# Patient Record
Sex: Male | Born: 1990
Health system: Southern US, Community
[De-identification: ages and names within clinical notes are randomized; demographics above are authoritative.]

## PROBLEM LIST (undated history)

## (undated) DIAGNOSIS — F909 Attention-deficit hyperactivity disorder, unspecified type: Secondary | ICD-10-CM

## (undated) DIAGNOSIS — F419 Anxiety disorder, unspecified: Secondary | ICD-10-CM

## (undated) DIAGNOSIS — F840 Autistic disorder: Secondary | ICD-10-CM

## (undated) HISTORY — DX: Autistic disorder: F84.0

## (undated) HISTORY — DX: Anxiety disorder, unspecified: F41.9

## (undated) HISTORY — DX: Attention-deficit hyperactivity disorder, unspecified type: F90.9

---
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
## 2012-01-02 ENCOUNTER — Institutional Professional Consult (permissible substitution): Payer: BC Managed Care – PPO | Admitting: Pediatrics

## 2012-01-13 ENCOUNTER — Institutional Professional Consult (permissible substitution) (INDEPENDENT_AMBULATORY_CARE_PROVIDER_SITE_OTHER): Payer: BC Managed Care – PPO | Admitting: Pediatrics

## 2012-01-13 DIAGNOSIS — R279 Unspecified lack of coordination: Secondary | ICD-10-CM

## 2012-01-13 DIAGNOSIS — F909 Attention-deficit hyperactivity disorder, unspecified type: Secondary | ICD-10-CM

## 2012-04-13 ENCOUNTER — Institutional Professional Consult (permissible substitution) (INDEPENDENT_AMBULATORY_CARE_PROVIDER_SITE_OTHER): Payer: BC Managed Care – PPO | Admitting: Pediatrics

## 2012-04-13 DIAGNOSIS — R279 Unspecified lack of coordination: Secondary | ICD-10-CM

## 2012-04-13 DIAGNOSIS — F909 Attention-deficit hyperactivity disorder, unspecified type: Secondary | ICD-10-CM

## 2012-07-16 ENCOUNTER — Institutional Professional Consult (permissible substitution) (INDEPENDENT_AMBULATORY_CARE_PROVIDER_SITE_OTHER): Payer: 59 | Admitting: Pediatrics

## 2012-07-16 DIAGNOSIS — R279 Unspecified lack of coordination: Secondary | ICD-10-CM

## 2012-07-16 DIAGNOSIS — F909 Attention-deficit hyperactivity disorder, unspecified type: Secondary | ICD-10-CM

## 2012-07-17 IMAGING — CT CT PARANASAL SINUSES LIMITED
1 series · 8 of 10 positions shown, 10 images · non-contrast
Comparison: 02/03/2007

CLINICAL DATA: Frontal sinus pain.  Pressure.  Headaches.

CT PARANASAL SINUSES WITHOUT CONTRAST
TECHNIQUE: Multidetector CT through the paranasal sinuses was
performed using the standard protocol without intravenous contrast.

[Series 3: cor soft · axial · 0.35mm/px · z∈[+0,+70]mm · 8 of 10 slices shown, 10 images]
[im 2/10  brain]
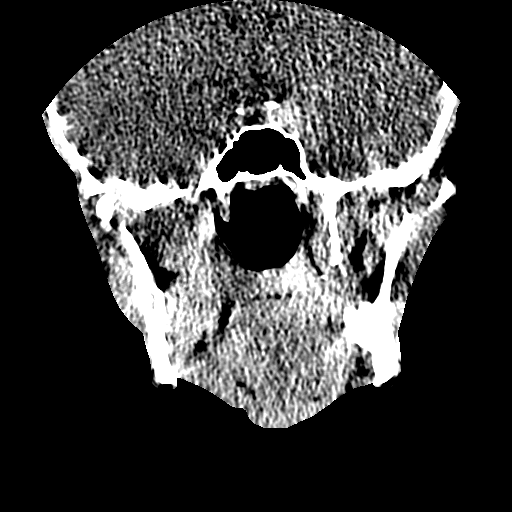
[im 2/10  bone]
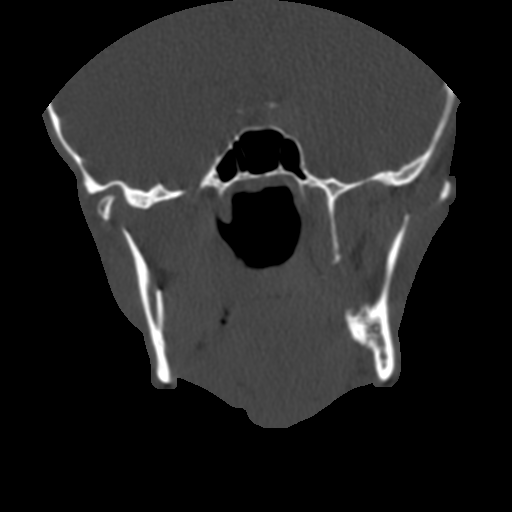
[im 3/10  bone]
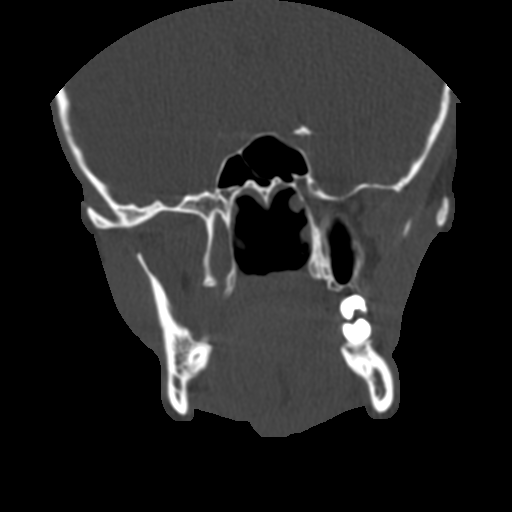
[im 4/10  bone]
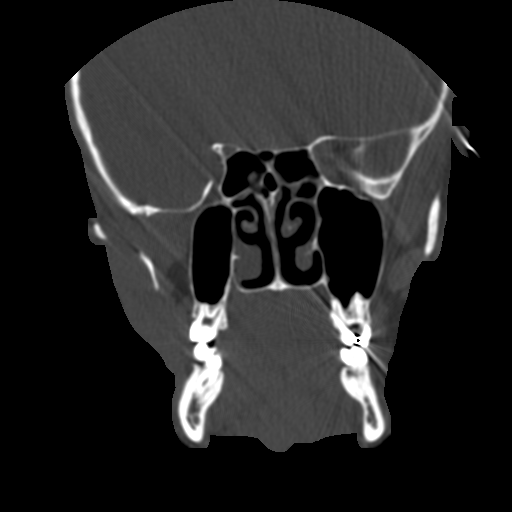
[im 5/10  bone]
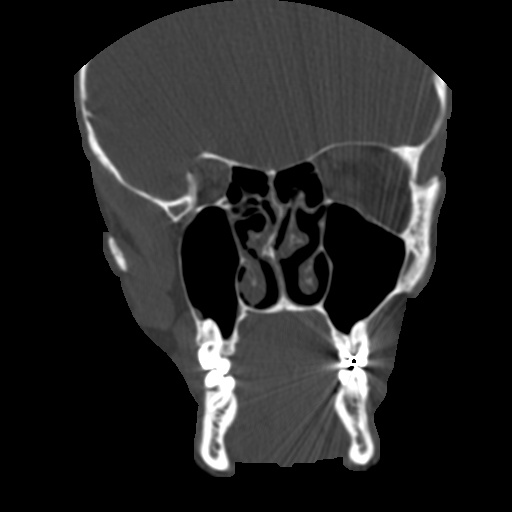
[im 6/10  brain]
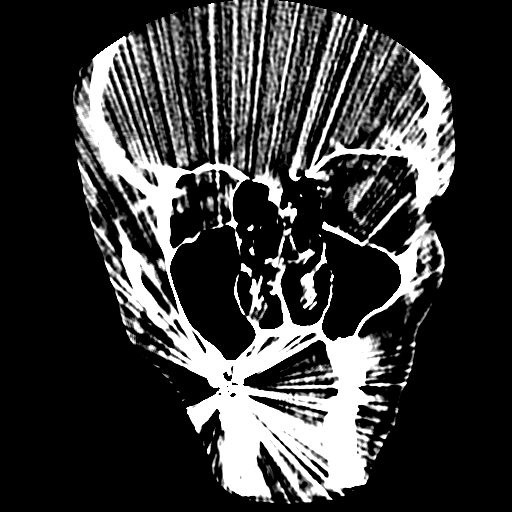
[im 6/10  bone]
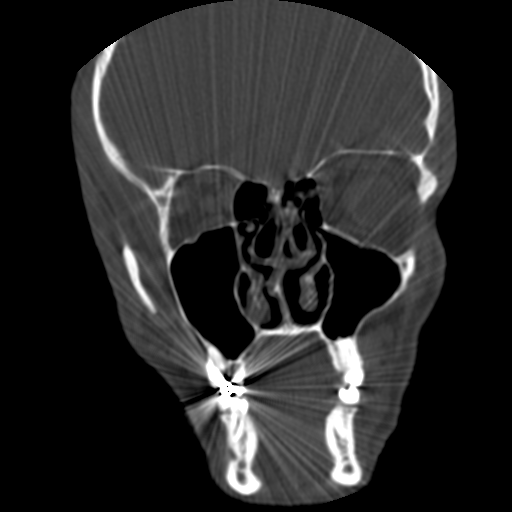
[im 7/10  bone]
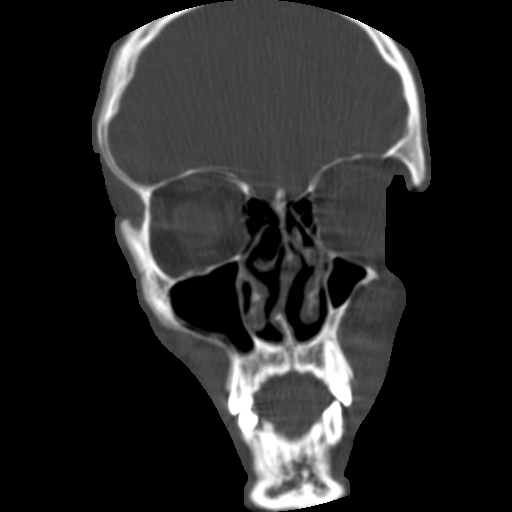
[im 8/10  bone]
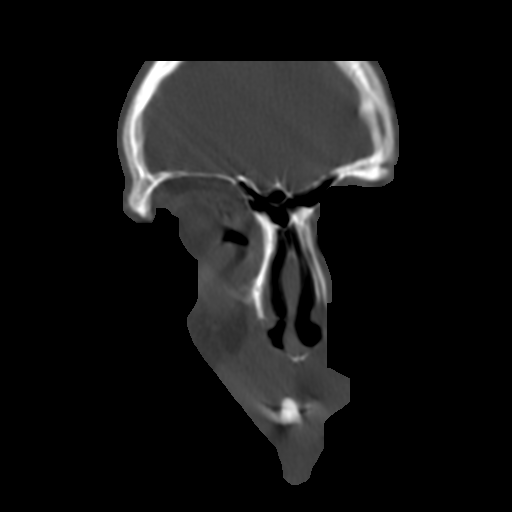
[im 9/10  bone]
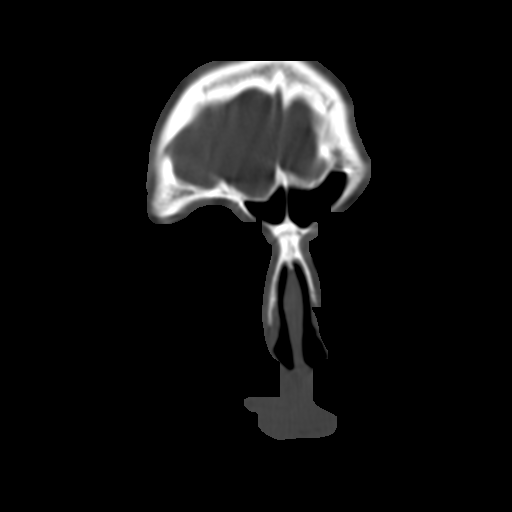

[8 of 10 positions shown; findings below may reference images not displayed]

FINDINGS: Frontal, ethmoid, sphenoid and maxillary sinuses are all
clear.  No evidence of adenoidal hypertrophy.
IMPRESSION: Negative examination.

## 2012-10-22 ENCOUNTER — Institutional Professional Consult (permissible substitution) (INDEPENDENT_AMBULATORY_CARE_PROVIDER_SITE_OTHER): Payer: 59 | Admitting: Pediatrics

## 2012-10-22 DIAGNOSIS — R279 Unspecified lack of coordination: Secondary | ICD-10-CM

## 2012-10-22 DIAGNOSIS — F909 Attention-deficit hyperactivity disorder, unspecified type: Secondary | ICD-10-CM

## 2013-01-21 ENCOUNTER — Institutional Professional Consult (permissible substitution) (INDEPENDENT_AMBULATORY_CARE_PROVIDER_SITE_OTHER): Payer: 59 | Admitting: Pediatrics

## 2013-01-21 DIAGNOSIS — R279 Unspecified lack of coordination: Secondary | ICD-10-CM

## 2013-01-21 DIAGNOSIS — F909 Attention-deficit hyperactivity disorder, unspecified type: Secondary | ICD-10-CM

## 2013-04-22 ENCOUNTER — Institutional Professional Consult (permissible substitution): Payer: 59 | Admitting: Pediatrics

## 2013-04-29 ENCOUNTER — Institutional Professional Consult (permissible substitution) (INDEPENDENT_AMBULATORY_CARE_PROVIDER_SITE_OTHER): Payer: BC Managed Care – PPO | Admitting: Pediatrics

## 2013-04-29 DIAGNOSIS — F909 Attention-deficit hyperactivity disorder, unspecified type: Secondary | ICD-10-CM

## 2013-04-29 DIAGNOSIS — R279 Unspecified lack of coordination: Secondary | ICD-10-CM

## 2013-07-06 IMAGING — CR DG CHEST 2V
2 series · 2 of 2 positions shown · non-contrast
Comparison: None.

CLINICAL DATA: Asthma.  Acute asthma exacerbation.

CHEST - 2 VIEW

[view not recorded (1 of 2)]
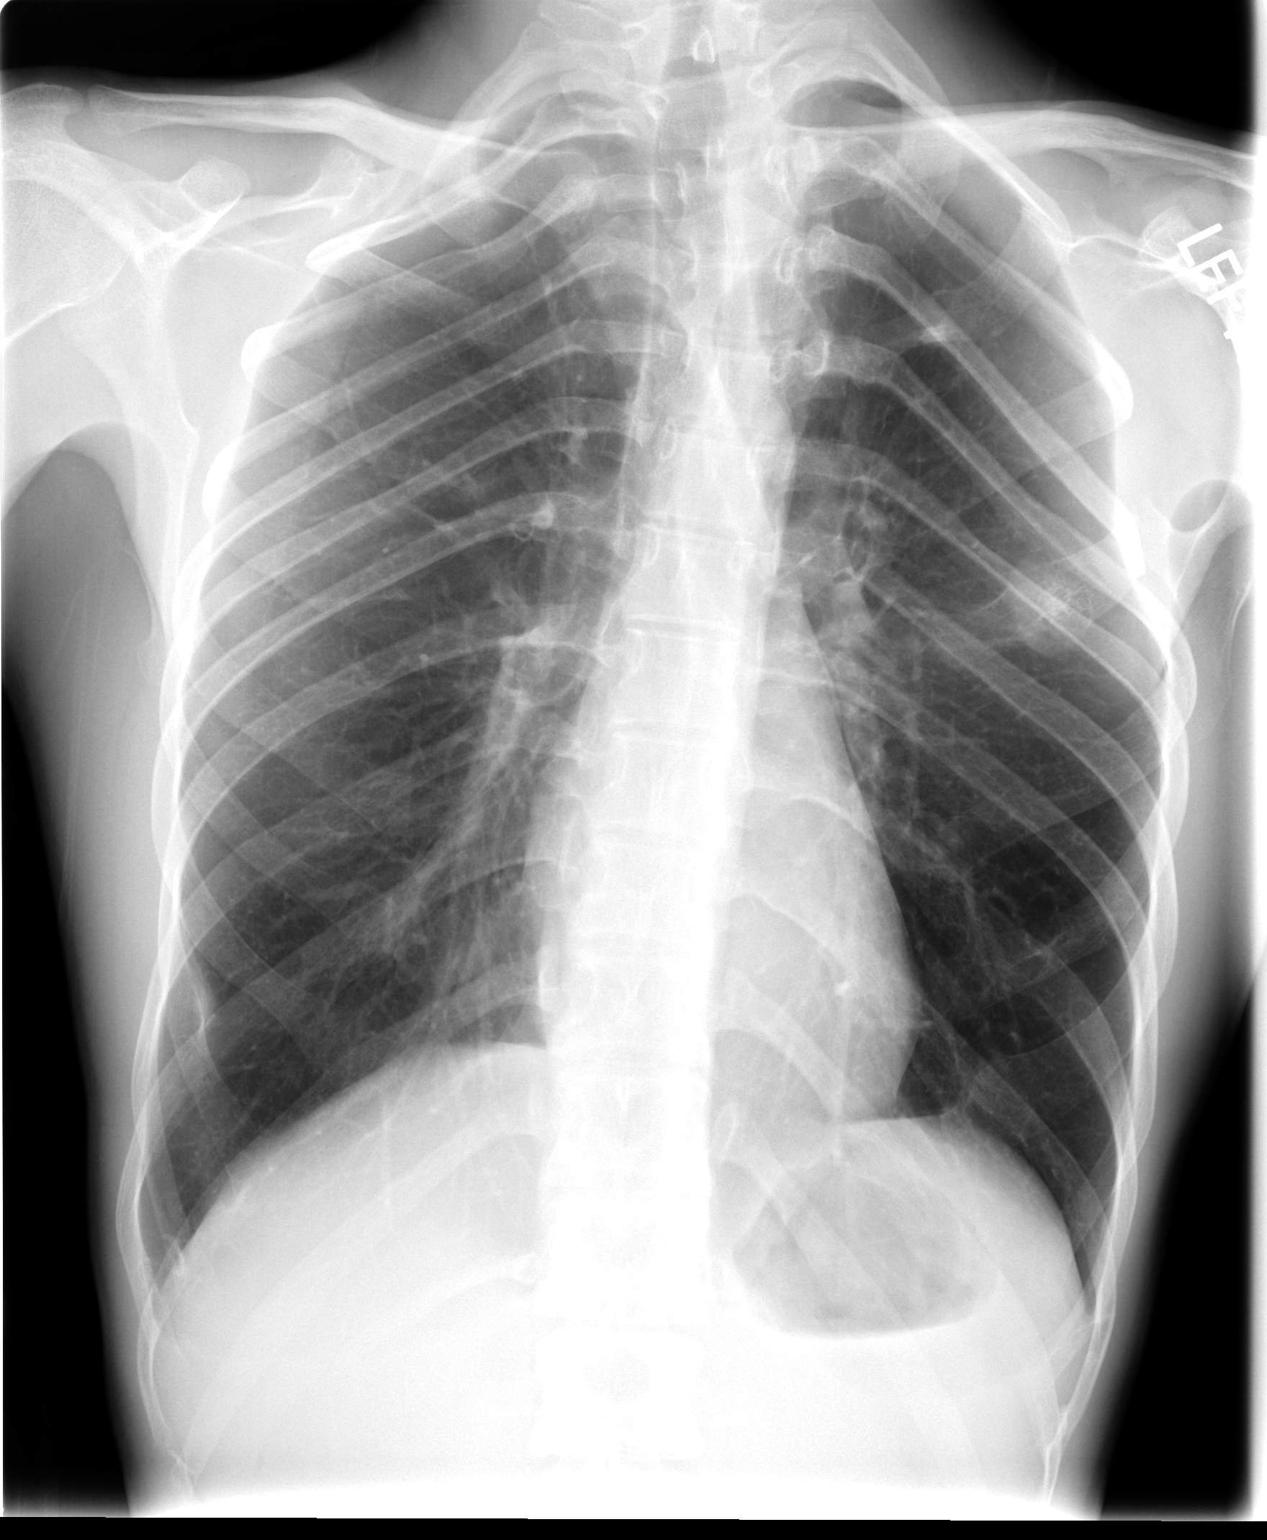

[view not recorded (2 of 2)]
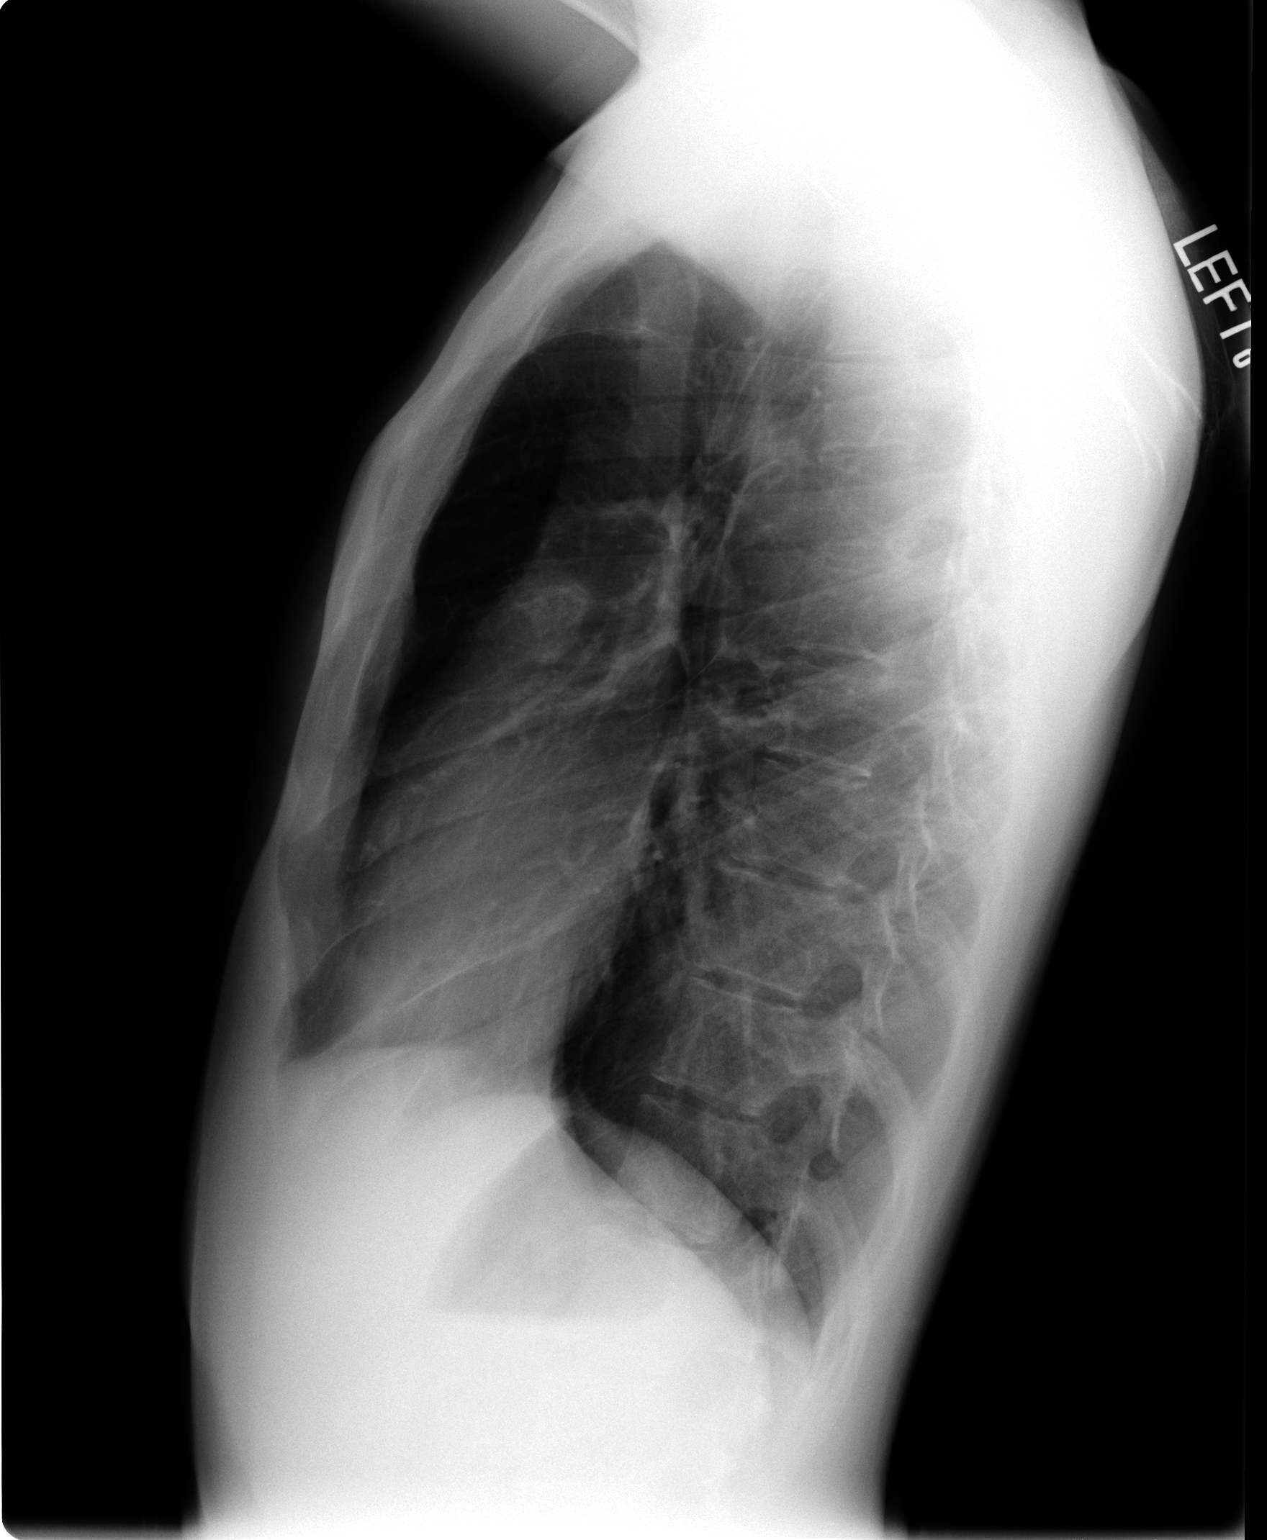

[2 of 2 positions shown; findings below may reference images not displayed]

FINDINGS: The left midlung pulmonary nodule is unchanged,
compatible with callus from rib fracture.  This continues to
measure 3 cm.

Pulmonary hyperinflation is present.  There is no pneumothorax.  No
pneumomediastinum is present.  Cardiopericardial silhouette and
mediastinal contours are within normal limits.  Old right lower rib
fracture is present. There is no airspace disease.  No effusion.
IMPRESSION: 1.  Hyperinflation compatible with clinical history of asthma.
2.  Nodular opacity left midlung compatible with callus from rib
fracture.  This is stable at 3 cm from prior exam 12/05/2010.

## 2013-07-15 ENCOUNTER — Institutional Professional Consult (permissible substitution): Payer: BC Managed Care – PPO | Admitting: Pediatrics

## 2013-08-11 ENCOUNTER — Institutional Professional Consult (permissible substitution) (INDEPENDENT_AMBULATORY_CARE_PROVIDER_SITE_OTHER): Payer: BC Managed Care – PPO | Admitting: Pediatrics

## 2013-08-11 DIAGNOSIS — F909 Attention-deficit hyperactivity disorder, unspecified type: Secondary | ICD-10-CM

## 2013-08-11 DIAGNOSIS — R279 Unspecified lack of coordination: Secondary | ICD-10-CM

## 2013-11-11 ENCOUNTER — Institutional Professional Consult (permissible substitution) (INDEPENDENT_AMBULATORY_CARE_PROVIDER_SITE_OTHER): Payer: BC Managed Care – PPO | Admitting: Pediatrics

## 2013-11-11 DIAGNOSIS — R279 Unspecified lack of coordination: Secondary | ICD-10-CM

## 2013-11-11 DIAGNOSIS — F909 Attention-deficit hyperactivity disorder, unspecified type: Secondary | ICD-10-CM

## 2014-02-03 ENCOUNTER — Institutional Professional Consult (permissible substitution): Payer: BC Managed Care – PPO | Admitting: Pediatrics

## 2014-02-10 ENCOUNTER — Institutional Professional Consult (permissible substitution) (INDEPENDENT_AMBULATORY_CARE_PROVIDER_SITE_OTHER): Payer: BC Managed Care – PPO | Admitting: Pediatrics

## 2014-02-10 DIAGNOSIS — F909 Attention-deficit hyperactivity disorder, unspecified type: Secondary | ICD-10-CM

## 2014-02-10 DIAGNOSIS — R279 Unspecified lack of coordination: Secondary | ICD-10-CM

## 2014-05-05 ENCOUNTER — Institutional Professional Consult (permissible substitution): Payer: BC Managed Care – PPO | Admitting: Pediatrics

## 2014-05-05 DIAGNOSIS — F909 Attention-deficit hyperactivity disorder, unspecified type: Secondary | ICD-10-CM

## 2014-05-05 DIAGNOSIS — R279 Unspecified lack of coordination: Secondary | ICD-10-CM

## 2014-07-28 ENCOUNTER — Institutional Professional Consult (permissible substitution) (INDEPENDENT_AMBULATORY_CARE_PROVIDER_SITE_OTHER): Payer: BC Managed Care – PPO | Admitting: Pediatrics

## 2014-07-28 DIAGNOSIS — F902 Attention-deficit hyperactivity disorder, combined type: Secondary | ICD-10-CM

## 2014-07-28 DIAGNOSIS — F8181 Disorder of written expression: Secondary | ICD-10-CM

## 2014-08-03 ENCOUNTER — Institutional Professional Consult (permissible substitution): Payer: BC Managed Care – PPO | Admitting: Pediatrics

## 2014-10-26 ENCOUNTER — Institutional Professional Consult (permissible substitution): Payer: BLUE CROSS/BLUE SHIELD | Admitting: Pediatrics

## 2014-10-26 DIAGNOSIS — F8181 Disorder of written expression: Secondary | ICD-10-CM

## 2014-10-26 DIAGNOSIS — F902 Attention-deficit hyperactivity disorder, combined type: Secondary | ICD-10-CM

## 2015-01-26 ENCOUNTER — Institutional Professional Consult (permissible substitution): Payer: BLUE CROSS/BLUE SHIELD | Admitting: Pediatrics

## 2015-01-26 DIAGNOSIS — F812 Mathematics disorder: Secondary | ICD-10-CM | POA: Diagnosis not present

## 2015-01-26 DIAGNOSIS — F902 Attention-deficit hyperactivity disorder, combined type: Secondary | ICD-10-CM | POA: Diagnosis not present

## 2015-05-04 ENCOUNTER — Institutional Professional Consult (permissible substitution): Payer: BLUE CROSS/BLUE SHIELD | Admitting: Pediatrics

## 2015-05-08 ENCOUNTER — Institutional Professional Consult (permissible substitution): Payer: Commercial Managed Care - HMO | Admitting: Pediatrics

## 2015-05-08 DIAGNOSIS — F902 Attention-deficit hyperactivity disorder, combined type: Secondary | ICD-10-CM | POA: Diagnosis not present

## 2015-05-08 DIAGNOSIS — F8181 Disorder of written expression: Secondary | ICD-10-CM | POA: Diagnosis not present

## 2015-05-09 ENCOUNTER — Institutional Professional Consult (permissible substitution): Payer: BLUE CROSS/BLUE SHIELD | Admitting: Pediatrics

## 2015-08-03 ENCOUNTER — Institutional Professional Consult (permissible substitution): Payer: Commercial Managed Care - HMO | Admitting: Family

## 2015-08-03 DIAGNOSIS — F900 Attention-deficit hyperactivity disorder, predominantly inattentive type: Secondary | ICD-10-CM | POA: Diagnosis not present

## 2015-08-03 DIAGNOSIS — F8181 Disorder of written expression: Secondary | ICD-10-CM | POA: Diagnosis not present

## 2015-10-26 ENCOUNTER — Institutional Professional Consult (permissible substitution): Payer: BLUE CROSS/BLUE SHIELD | Admitting: Family

## 2015-10-26 DIAGNOSIS — F411 Generalized anxiety disorder: Secondary | ICD-10-CM

## 2015-10-26 DIAGNOSIS — F900 Attention-deficit hyperactivity disorder, predominantly inattentive type: Secondary | ICD-10-CM

## 2015-10-26 DIAGNOSIS — F8181 Disorder of written expression: Secondary | ICD-10-CM

## 2015-12-18 ENCOUNTER — Telehealth: Payer: Self-pay

## 2015-12-18 NOTE — Telephone Encounter (Signed)
Mom left a message today for a rx refill request. She did not state the name of the medication.

## 2015-12-19 MED ORDER — METHYLPHENIDATE HCL ER (CD) 50 MG PO CPCR: 50.0000 mg | ORAL_CAPSULE | ORAL | Status: DC

## 2015-12-21 ENCOUNTER — Telehealth: Payer: Self-pay | Admitting: Pediatrics

## 2015-12-21 NOTE — Telephone Encounter (Signed)
Mom here to pick up medication Rx as written 12/18/2015

## 2016-01-02 ENCOUNTER — Other Ambulatory Visit: Payer: Self-pay | Admitting: Family

## 2016-01-02 DIAGNOSIS — F411 Generalized anxiety disorder: Secondary | ICD-10-CM

## 2016-01-02 MED ORDER — SERTRALINE HCL 25 MG PO TABS: 25.0000 mg | ORAL_TABLET | ORAL | Status: DC

## 2016-01-02 NOTE — Telephone Encounter (Signed)
[facility] Sertraline HCL 25 mg.  Patient last seen 10/28/15, next appointment 01/25/16.

## 2016-01-02 NOTE — Telephone Encounter (Signed)
Duplicate encounter, disregard.

## 2016-01-02 NOTE — Telephone Encounter (Signed)
E-Prescribed Sertraline directly to pharmacy of choice

## 2016-01-02 NOTE — Telephone Encounter (Signed)
Received fax for refill for Sertraline.  Patient last seen 10/26/15, next appointment 01/25/16.

## 2016-01-16 ENCOUNTER — Other Ambulatory Visit: Payer: Self-pay | Admitting: Pediatrics

## 2016-01-16 DIAGNOSIS — F902 Attention-deficit hyperactivity disorder, combined type: Secondary | ICD-10-CM

## 2016-01-16 DIAGNOSIS — F411 Generalized anxiety disorder: Secondary | ICD-10-CM

## 2016-01-16 MED ORDER — METHYLPHENIDATE HCL ER (CD) 50 MG PO CPCR: 50.0000 mg | ORAL_CAPSULE | ORAL | Status: DC

## 2016-01-16 NOTE — Telephone Encounter (Signed)
Mom called for refill for Concerta.  Patient last seen 10/26/15, next appointment 01/25/16.

## 2016-01-16 NOTE — Telephone Encounter (Signed)
Printed Rx for Metadate CD 50 mg and placed at front desk for pick-up

## 2016-01-25 ENCOUNTER — Ambulatory Visit (INDEPENDENT_AMBULATORY_CARE_PROVIDER_SITE_OTHER): Payer: Commercial Managed Care - HMO | Admitting: Family

## 2016-01-25 ENCOUNTER — Encounter: Payer: Self-pay | Admitting: Family

## 2016-01-25 VITALS — BP 112/66 | HR 88 | Resp 16 | Ht 65.75 in | Wt 112.0 lb

## 2016-01-25 DIAGNOSIS — F411 Generalized anxiety disorder: Secondary | ICD-10-CM | POA: Diagnosis not present

## 2016-01-25 DIAGNOSIS — R278 Other lack of coordination: Secondary | ICD-10-CM | POA: Insufficient documentation

## 2016-01-25 DIAGNOSIS — F902 Attention-deficit hyperactivity disorder, combined type: Secondary | ICD-10-CM | POA: Insufficient documentation

## 2016-01-25 MED ORDER — BUSPIRONE HCL 30 MG PO TABS
30.0000 mg | ORAL_TABLET | Freq: Every day | ORAL | Status: DC
Start: 2016-01-25 — End: 2016-04-25

## 2016-01-25 MED ORDER — METHYLPHENIDATE HCL ER (CD) 40 MG PO CPCR: 40.0000 mg | ORAL_CAPSULE | Freq: Every day | ORAL | Status: DC

## 2016-01-25 MED ORDER — SERTRALINE HCL 25 MG PO TABS: 25.0000 mg | ORAL_TABLET | ORAL | Status: DC

## 2016-01-25 NOTE — Progress Notes (Signed)
[facility] [place] [facility] [address], [address] [place] [address] Dept: [phone] Dept Fax: [phone] Loc: [phone] Loc Fax: [phone]  Medical Follow-up  Patient ID: Michael [name], male  DOB: [birth year], 25 y.o.  MRN: [id number]  Date of Evaluation: 01/25/16  PCP: Georgann HousekeeperHUSAIN,KARRAR, MD  Accompanied by: self Patient Lives with: mother  HISTORY/CURRENT STATUS:Polite and cooperative and present for three month follow up.  HPI  Patient here for routine follow up related to ADHD and medication management.  EDUCATION: [employer]: [employer] Year/Grade: 4th year Homework Time: Increased time with studying and papers. Performance/Grades: above average Services: IEP/504 Plan Activities/Exercise: daily  Current Exercise Habits: Home exercise routine, Type of exercise: walking, Time (Minutes): 60, Frequency (Times/Week): 3, Weekly Exercise (Minutes/Week): 180, Intensity: Mild Exercise limited by: None identified   MEDICAL HISTORY: Appetite: Good MVI/Other: none Fruits/Vegs:some Calcium: some Iron:some  Sleep: Bedtime: 10;30 pm Awakens: 7:00 am Sleep Concerns: Initiation/Maintenance/Other: no problems  Individual Medical History/Review of System Changes? No  Allergies: Review of patient's allergies indicates no known allergies.  Current Medications:  Current outpatient prescriptions:  .  methylphenidate (METADATE CD) 50 MG CR capsule, Take 1 capsule (50 mg total) by mouth every morning., Disp: 30 capsule, Rfl: 0 .  sertraline (ZOLOFT) 25 MG tablet, Take 1 tablet (25 mg total) by mouth as directed. 1-2 tablets daily, Disp: 60 tablet, Rfl: 0 Medication Side Effects: None and Other: some increased anxiety with Concerta dose  Family Medical/Social History Changes?: No  MENTAL HEALTH: Mental Health Issues: Anxiety-some increased when takes  Concerta  PHYSICAL EXAM: Vitals: There were no vitals filed for this visit., Facility age limit for growth percentiles is 20 years.  General Exam: Physical Exam  Constitutional: He is oriented to person, place, and time. He appears well-developed and well-nourished.  HENT:  Head: Normocephalic and atraumatic.  Right Ear: External ear normal.  Left Ear: External ear normal.  Nose: Nose normal.  Mouth/Throat: Oropharynx is clear and moist.  Eyes: Conjunctivae and EOM are normal. Pupils are equal, round, and reactive to light.  Neck: Trachea normal, normal range of motion and full passive range of motion without pain. Neck supple.  Cardiovascular: Normal rate, regular rhythm, normal heart sounds and intact distal pulses.   Pulmonary/Chest: Effort normal and breath sounds normal.  Abdominal: Soft. Bowel sounds are normal.  Musculoskeletal: Normal range of motion.  Neurological: He is alert and oriented to person, place, and time. He has normal reflexes.  Skin: Skin is warm, dry and intact.  Psychiatric: He has a normal mood and affect. His behavior is normal. Judgment and thought content normal.  Vitals reviewed.    Neurological: oriented to time, place, and person Cranial Nerves: normal  Neuromuscular:  Motor Mass: normal Tone: normal Strength: normal DTRs: 2+ and symmetric Overflow: none Reflexes: no tremors noted Sensory Exam: Vibratory: intact  Fine Touch: intact  Testing/Developmental Screens: ASRS-0/0 scored by patient    DIAGNOSES:    ICD-9-CM ICD-10-CM   1. ADHD (attention deficit hyperactivity disorder), combined type 314.01 F90.2   2. Generalized anxiety disorder 300.02 F41.1   3. Dysgraphia 781.3 R27.8    RECOMMENDATIONS: 3 month follow up. Changed Metadate CD to 40 mg 1 tablet daily, # 30 script given, Buspar 30 mg tablets 1 po daily # 30 and Zoloft 25 mg 1 1/2 tablets daily # 45 [facility] [address]  NEXT APPOINTMENT: No Follow-up on  file.  Carron Curie, NP Counseling Time: 40 mins Total Contact Time: 40 mins More than 50% of the appointment was spent counseling and discussing diagnosis and management of symptoms with the patient and family.

## 2016-01-29 ENCOUNTER — Other Ambulatory Visit: Payer: Self-pay | Admitting: Pediatrics

## 2016-01-29 NOTE — Telephone Encounter (Signed)
[facility] for Bus[facility]one HCL 15 mg.  Patient last seen 01/25/16, next appointment 04/25/16.

## 2016-01-30 NOTE — Telephone Encounter (Signed)
Patient was provided new handwritten RX on 10/26/15 at visit for Buspar.  [facility] indicated date written 08-03-15.  Pharmacy informed that patient has new RX dated 10/26/15.

## 2016-02-13 ENCOUNTER — Other Ambulatory Visit: Payer: Self-pay | Admitting: Pediatrics

## 2016-02-13 DIAGNOSIS — F411 Generalized anxiety disorder: Secondary | ICD-10-CM

## 2016-02-13 MED ORDER — SERTRALINE HCL 25 MG PO TABS: 25.0000 mg | ORAL_TABLET | ORAL | Status: DC

## 2016-02-13 NOTE — Telephone Encounter (Signed)
[facility] with 2 additional refills

## 2016-02-13 NOTE — Telephone Encounter (Signed)
Received fax for refill for Sertraline 25 mg.  Patient last seen 01/25/16, next appointment 04/25/16.

## 2016-03-13 ENCOUNTER — Other Ambulatory Visit: Payer: Self-pay | Admitting: Pediatrics

## 2016-03-13 MED ORDER — METHYLPHENIDATE HCL ER (CD) 40 MG PO CPCR
40.0000 mg | ORAL_CAPSULE | Freq: Every day | ORAL | Status: DC
Start: 2016-03-13 — End: 2016-04-21

## 2016-03-13 NOTE — Telephone Encounter (Signed)
Mom called for refill for Concerta.  Patient last seen 01/25/16, next appointment 04/25/16. °

## 2016-03-13 NOTE — Telephone Encounter (Signed)
Printed Rx and placed at front desk for pick-up-Metadate CD 40 mg daily

## 2016-03-20 ENCOUNTER — Other Ambulatory Visit: Payer: Self-pay | Admitting: Family

## 2016-03-22 ENCOUNTER — Other Ambulatory Visit: Payer: Self-pay | Admitting: Family

## 2016-04-21 ENCOUNTER — Telehealth: Payer: Self-pay | Admitting: Pediatrics

## 2016-04-21 MED ORDER — METHYLPHENIDATE HCL ER (CD) 40 MG PO CPCR: 40.0000 mg | ORAL_CAPSULE | Freq: Every day | ORAL | Status: DC

## 2016-04-21 NOTE — Telephone Encounter (Signed)
Prescription for Metadate CD 40 mg (generic) printed, signed, and left for pickup.

## 2016-04-21 NOTE — Telephone Encounter (Signed)
Mom called for refill for Concerta.  Patient last seen 01/25/16, next appointment 04/25/16.

## 2016-04-25 ENCOUNTER — Encounter: Payer: Self-pay | Admitting: Family

## 2016-04-25 ENCOUNTER — Ambulatory Visit (INDEPENDENT_AMBULATORY_CARE_PROVIDER_SITE_OTHER): Payer: Managed Care, Other (non HMO) | Admitting: Family

## 2016-04-25 VITALS — BP 110/68 | HR 72 | Resp 16 | Ht 65.75 in | Wt 116.4 lb

## 2016-04-25 DIAGNOSIS — F902 Attention-deficit hyperactivity disorder, combined type: Secondary | ICD-10-CM

## 2016-04-25 DIAGNOSIS — R278 Other lack of coordination: Secondary | ICD-10-CM

## 2016-04-25 DIAGNOSIS — F411 Generalized anxiety disorder: Secondary | ICD-10-CM | POA: Diagnosis not present

## 2016-04-25 MED ORDER — SERTRALINE HCL 25 MG PO TABS: 25.0000 mg | ORAL_TABLET | ORAL | Status: DC

## 2016-04-25 MED ORDER — BUSPIRONE HCL 30 MG PO TABS: 30.0000 mg | ORAL_TABLET | Freq: Every day | ORAL | Status: DC

## 2016-04-25 NOTE — Progress Notes (Signed)
  [facility] [address], [address] [place] [address] Dept: [phone] Dept Fax: [phone] Loc: [phone] Loc Fax: [phone]  Medical Follow-up  Patient ID: Michael [name], male  DOB: January 26, 1991, 25 y.o.  MRN: [id number]  Date of Evaluation: 04/25/16  PCP: Georgann HousekeeperHUSAIN,KARRAR, MD  Accompanied by: self Patient Lives with: parents  HISTORY/CURRENT STATUS:  HPI  Patient here for routine follow up related to ADHD and medication management. Patient cooperative and interactive at today's visit. Reports doing well on current medication.   EDUCATION: School: [employer] Year/Grade: [employer] Homework Time: 2 Hours or more Performance/Grades: above average Services: Other: Services available through [employer] office Activities/Exercise: daily-walking Current Exercise Habits: Home exercise routine, Type of exercise: Other - see comments (Walking), Time (Minutes): 30, Frequency (Times/Week): 5, Weekly Exercise (Minutes/Week): 150, Intensity: Mild Exercise limited by: None identified   MEDICAL HISTORY: Appetite: Good MVI/Other: None Fruits/Vegs:Some Calcium: Some Iron:Some  Sleep:  Sleep Concerns: Initiation/Maintenance/Other: No problems reported  Individual Medical History/Review of System Changes? No  Allergies: Review of patient's allergies indicates no known allergies.  Current Medications:  Current outpatient prescriptions:  .  busPIRone (BUSPAR) 30 MG tablet, Take 1 tablet (30 mg total) by mouth daily., Disp: 30 tablet, Rfl: 2 .  methylphenidate (METADATE CD) 40 MG CR capsule, Take 1 capsule (40 mg total) by mouth daily., Disp: 30 capsule, Rfl: 0 .  sertraline (ZOLOFT) 25 MG tablet, Take 1 tablet (25 mg total) by mouth as directed. 1-2 tablets daily, Disp: 60 tablet, Rfl: 2 Medication Side Effects: None  Family  Medical/Social History Changes?: No  MENTAL HEALTH: Mental Health Issues: Anxiety  Depression screen [facility] 2/9 04/25/2016  Decreased Interest 0  Down, Depressed, Hopeless 0  PHQ - 2 Score 0     PHYSICAL EXAM: Vitals:  Today's Vitals   04/25/16 1503  Height: 5' 5.75" (1.67 m)  Weight: 116 lb 6.4 oz (52.799 kg)  , Facility age limit for growth percentiles is 20 years.  General Exam: Physical Exam  Neurological: oriented to time, place, and person Cranial Nerves: normal  Neuromuscular:  Motor Mass: Normal Tone: Normal Strength: Normal DTRs: 2+ and symmetric Overflow: None Reflexes: no tremors noted Sensory Exam: Vibratory: Intact  Fine Touch: Intact  Testing/Developmental Screens:  ASRS-0/0 scored and reviewed with patient     DIAGNOSES:    ICD-9-CM ICD-10-CM   1. ADHD (attention deficit hyperactivity disorder), combined type 314.01 F90.2   2. Generalized anxiety disorder 300.02 F41.1   3. Dysgraphia 781.3 R27.8     RECOMMENDATIONS: 3 month follow up and continuation of medication. Refill for medication printed on 04/21/16 and given to patient today at today's visit.  [facility] 30 mg 1 daily and Zoloft 25 mg 1-2 daily with 2 RF's.  Children with ADHD often suffer from disorganization and other executive function difficulties.  Recommended Reading:  "Late, Lost, and Unprepared:  A Parents' Guide to Helping Children with Executive Functioning" by Rolm GalaJoyce Cooper-Kahn and Remigio EisenmengerLaurie Dietzel "Smart but Scattered" and "Smart but Scattered Teens" by Peg Arita Missawson and Marjo Bickerichard Guare.    NEXT APPOINTMENT: Return in about 3 months (around 07/26/2016) for follow up.  More than 50% of the appointment was spent counseling and discussing diagnosis and management of symptoms with the patient and family. Carron Curieawn M Paretta-Leahey, NP Counseling Time: 30 mins Total Contact Time: 40 mins

## 2016-04-30 ENCOUNTER — Telehealth: Payer: Self-pay | Admitting: Family

## 2016-04-30 ENCOUNTER — Other Ambulatory Visit: Payer: Self-pay | Admitting: Family

## 2016-04-30 NOTE — Telephone Encounter (Signed)
Prior authorization submitted through cover my meds. Authorization pending.

## 2016-04-30 NOTE — Telephone Encounter (Signed)
[facility] for Sertraline 25 mg.  Patient last seen 04/25/16.

## 2016-04-30 NOTE — Telephone Encounter (Signed)
RX for Buspar 15 mg e-scribed and [facility] [address]

## 2016-05-05 NOTE — Telephone Encounter (Signed)
Approval received for Zoloft 25 mg BID via cover my meds on 05/02/16.([employer] Dials) Authorization # [id number].

## 2016-05-12 ENCOUNTER — Other Ambulatory Visit: Payer: Self-pay | Admitting: Family

## 2016-05-12 MED ORDER — METHYLPHENIDATE HCL ER (CD) 40 MG PO CPCR: 40.0000 mg | ORAL_CAPSULE | Freq: Every day | ORAL | 0 refills | Status: DC

## 2016-05-12 NOTE — Telephone Encounter (Signed)
Mom called for refill for Concerta.  Patient last seen 04/25/16.

## 2016-05-12 NOTE — Telephone Encounter (Signed)
Patient is on Metadate CD [employer] sent a notice saying no PA is required Printed Rx for Metadate CD and placed at front desk for pick-up

## 2016-07-10 ENCOUNTER — Other Ambulatory Visit: Payer: Self-pay | Admitting: Family

## 2016-07-10 ENCOUNTER — Telehealth: Payer: Self-pay | Admitting: Pediatrics

## 2016-07-10 MED ORDER — METHYLPHENIDATE HCL ER (CD) 40 MG PO CPCR: 40.0000 mg | ORAL_CAPSULE | Freq: Every day | ORAL | 0 refills | Status: DC

## 2016-07-10 NOTE — Telephone Encounter (Signed)
Mom called for refill for Concerta.  Patient last seen 04/25/16, next appointment 07/25/16.

## 2016-07-10 NOTE — Telephone Encounter (Signed)
error 

## 2016-07-10 NOTE — Telephone Encounter (Signed)
Printed Rx and placed at front desk for pick-up  

## 2016-07-25 ENCOUNTER — Ambulatory Visit (INDEPENDENT_AMBULATORY_CARE_PROVIDER_SITE_OTHER): Payer: Managed Care, Other (non HMO) | Admitting: Family

## 2016-07-25 ENCOUNTER — Encounter: Payer: Self-pay | Admitting: Family

## 2016-07-25 VITALS — BP 102/64 | HR 68 | Resp 16 | Ht 65.75 in | Wt 115.2 lb

## 2016-07-25 DIAGNOSIS — F411 Generalized anxiety disorder: Secondary | ICD-10-CM | POA: Diagnosis not present

## 2016-07-25 DIAGNOSIS — R278 Other lack of coordination: Secondary | ICD-10-CM | POA: Diagnosis not present

## 2016-07-25 DIAGNOSIS — F902 Attention-deficit hyperactivity disorder, combined type: Secondary | ICD-10-CM

## 2016-07-25 MED ORDER — METHYLPHENIDATE HCL ER (CD) 40 MG PO CPCR: 40.0000 mg | ORAL_CAPSULE | Freq: Every day | ORAL | 0 refills | Status: DC

## 2016-07-25 MED ORDER — BUSPIRONE HCL 30 MG PO TABS: 30.0000 mg | ORAL_TABLET | Freq: Every day | ORAL | 2 refills | Status: DC

## 2016-07-25 MED ORDER — SERTRALINE HCL 25 MG PO TABS: 25.0000 mg | ORAL_TABLET | ORAL | 2 refills | Status: DC

## 2016-07-25 NOTE — Progress Notes (Signed)
[facility] [facility] [address], [address] [place] [address] Dept: [phone] Dept Fax: [phone] Loc: [phone] Loc Fax: [phone]  Medical Follow-up  Patient ID: Michael [name], male  DOB: 1991-02-02, 25 y.o.  MRN: [id number]  Date of Evaluation: 07/25/16  PCP: Georgann HousekeeperHUSAIN,KARRAR, MD  Accompanied QQ:VZDGby:Self and Mother in waiting room Patient Lives with: parents  HISTORY/CURRENT STATUS:  HPI  Patient here for routine follow up related to ADHD and medication management. Patient interactive and cooperative at today's visit. Patient has continued with current medication regimen. Some pm nausea around 5-6:00 pm when Metadate CD is "wearing" off.   EDUCATION: School: [employer] Year/Grade: [employer] JR. Homework Time: 2 or more hours Performance/Grades: above average Services: Other: Available through [employer] Office Activities/Exercise: intermittently  MEDICAL HISTORY: Appetite: Normal per patient MVI/Other: None Fruits/Vegs:Some Calcium: Some Iron:Some  Sleep: Bedtime: 10:30-11:00 pm Awakens: 7:00 am Sleep Concerns: Initiation/Maintenance/Other: No problems reported by patient  Individual Medical History/Review of System Changes? None routinely, but has to schedule for PCP.  Allergies: Review of patient's allergies indicates no known allergies.  Current Medications:  Current Outpatient Prescriptions:  .  busPIRone (BUSPAR) 15 MG tablet, TAKE 2 TABLETS BY MOUTH IN AM AND 1 TABLET IN PM DAILY AS DIRECTED, Disp: 90 tablet, Rfl: 2 .  busPIRone (BUSPAR) 30 MG tablet, Take 1 tablet (30 mg total) by mouth daily., Disp: 30 tablet, Rfl: 2 .  methylphenidate (METADATE CD) 40 MG CR capsule, Take 1 capsule (40 mg total) by mouth daily., Disp: 30 capsule, Rfl: 0 .  sertraline (ZOLOFT) 25 MG tablet, Take 1 tablet (25 mg total) by  mouth as directed. 1-2 tablets daily, Disp: 60 tablet, Rfl: 2 Medication Side Effects: None  Family Medical/Social History Changes?: No  MENTAL HEALTH: Mental Health Issues: Anxiety-history of this with treatment.   PHYSICAL EXAM: Vitals:  Today's Vitals   07/25/16 1405  Weight: 115 lb 3.2 oz (52.3 kg)  Height: 5' 5.75" (1.67 m)  , Facility age limit for growth percentiles is 20 years.  General Exam: Physical Exam  Constitutional: He is oriented to person, place, and time. He appears well-developed and well-nourished.  HENT:  Head: Normocephalic and atraumatic.  Right Ear: External ear normal.  Left Ear: External ear normal.  Nose: Nose normal.  Mouth/Throat: Oropharynx is clear and moist.  Eyes: Conjunctivae and EOM are normal. Pupils are equal, round, and reactive to light.  Neck: Trachea normal, normal range of motion and full passive range of motion without pain. Neck supple.  Cardiovascular: Normal rate, regular rhythm, normal heart sounds and intact distal pulses.   Pulmonary/Chest: Effort normal and breath sounds normal.  Abdominal: Soft. Bowel sounds are normal.  Musculoskeletal: Normal range of motion.  Neurological: He is alert and oriented to person, place, and time. He has normal reflexes.  Skin: Skin is warm, dry and intact. Capillary refill takes less than 2 seconds.  Psychiatric: He has a normal mood and affect. His behavior is normal. Judgment and thought content normal.  Vitals reviewed.   Neurological: oriented to time, place, and person Cranial Nerves: normal  Neuromuscular:  Motor Mass: Normal Tone: Normal Strength: Normal DTRs: 2+ and symmetric Overflow: None Reflexes: no tremors noted Sensory Exam: Vibratory: Intact  Fine Touch: Intact   Testing/Developmental Screens:  ASRS-   DIAGNOSES:    ICD-9-CM ICD-10-CM   1. ADHD (attention deficit hyperactivity disorder), combined type 314.01 F90.2   2. Dysgraphia  781.3 R27.8   3. Generalized anxiety  disorder 300.02 F41.1     RECOMMENDATIONS: 3 month follow up and continuation of medication. Printed script for Metadate CD 40 mg 1 daily, # 30 no refills, Bupsar 30 mg 1 daily, # 30 with 2 Refills escribed to pharmacy, and Zoloft 25 mg 1-2 tablets daily, # 60 script with 2 refills escribed to pharmacy.  School accommodations for students with attention deficits should be implemented.  Specific recommendations include adjusted seating (preferential seating), extended time testing, short-answer testing, adjusted amount of homework and modified assignments, and computer-based assignments both in the classroom and at home. Has some accommodations/modificaitons through [employer].  Educational strategies should address the styles of a visual learner and include the use of color and presentation of materials visually.  Using colored flashcards with colored markers to assist with learning sight words will facilitate reading fluency and decoding.  Additionally, breaking down instructions into single step commands with visual cues will improve processing and task completion because of the increased use of visual memory.  Use colored math flash cards with number families in specific colors.  For example color coding the times tables.  Note taking system such as Cornell Notes or visual cueing such as vocabulary squares.  Consider the purchase of the LiveScribe Smart Pen - Echo.  [email or web address]   NEXT APPOINTMENT: Return in about 3 months (around 10/25/2016) for follow up visits.  More than 50% of the appointment was spent counseling and discussing diagnosis and management of symptoms with the patient and family.  Carron Curie, NP Counseling Time: 30 mins Total Contact Time: 40 mins

## 2016-09-10 ENCOUNTER — Other Ambulatory Visit: Payer: Self-pay | Admitting: Pediatrics

## 2016-09-10 MED ORDER — METHYLPHENIDATE HCL ER (CD) 40 MG PO CPCR: 40.0000 mg | ORAL_CAPSULE | Freq: Every day | ORAL | 0 refills | Status: DC

## 2016-09-10 NOTE — Telephone Encounter (Signed)
Mom called for refill, did not specify medication.  Patient last seen 07/25/16, next appointment 10/24/16.

## 2016-09-10 NOTE — Telephone Encounter (Signed)
Metadate CD 40 mg #30 with no refills printed, signed, and left for pickup.

## 2016-10-20 ENCOUNTER — Other Ambulatory Visit: Payer: Self-pay | Admitting: Family

## 2016-10-20 DIAGNOSIS — F411 Generalized anxiety disorder: Secondary | ICD-10-CM

## 2016-10-20 NOTE — Telephone Encounter (Signed)
Has appt scheduled this coming Friday. Approved 1 month supply until seen

## 2016-10-21 ENCOUNTER — Ambulatory Visit (INDEPENDENT_AMBULATORY_CARE_PROVIDER_SITE_OTHER): Payer: Managed Care, Other (non HMO) | Admitting: Family

## 2016-10-21 ENCOUNTER — Encounter: Payer: Self-pay | Admitting: Family

## 2016-10-21 VITALS — Ht 65.75 in | Wt 115.8 lb

## 2016-10-21 DIAGNOSIS — R278 Other lack of coordination: Secondary | ICD-10-CM | POA: Diagnosis not present

## 2016-10-21 DIAGNOSIS — F411 Generalized anxiety disorder: Secondary | ICD-10-CM

## 2016-10-21 DIAGNOSIS — F902 Attention-deficit hyperactivity disorder, combined type: Secondary | ICD-10-CM

## 2016-10-21 MED ORDER — SERTRALINE HCL 25 MG PO TABS: ORAL_TABLET | ORAL | 2 refills | Status: DC

## 2016-10-21 MED ORDER — METHYLPHENIDATE HCL ER (CD) 40 MG PO CPCR: 40.0000 mg | ORAL_CAPSULE | Freq: Every day | ORAL | 0 refills | Status: DC

## 2016-10-21 MED ORDER — BUSPIRONE HCL 30 MG PO TABS: 30.0000 mg | ORAL_TABLET | Freq: Every day | ORAL | 2 refills | Status: DC

## 2016-10-21 NOTE — Progress Notes (Signed)
[facility] [address], [address] [place] [address] Dept: [phone] Dept Fax: [phone] Loc: [phone] Loc Fax: [phone]  Medical Follow-up  Patient ID: Michael [name], male  DOB: [birth year], 26 y.o.  MRN: [id number]  Date of Evaluation: 10/21/16  PCP: Georgann HousekeeperHUSAIN,KARRAR, MD  Accompanied by: Self and mother in waiting room Patient Lives with: mother  HISTORY/CURRENT STATUS:  HPI  Patient here for routine follow up related to ADHD and medication management. Patient here by himself for today's follow up visit. Patient doing well last semester and this semester started yesterday for the spring. Patient has continued to take Metadate CD 40 mg daily, Buspar 30 mg daily, Zoloft 25 mg 1-2 daily with no reported side effects.   EDUCATION: School: [employer]ology/Criminology/Math/German  Year/Grade: College Homework Time: Increased amount of time depending on class demands Performance/Grades: average Services: IEP/504 Plan-[employer]ARS accommodations Activities/Exercise: intermittently  MEDICAL HISTORY: Appetite: Good MVI/Other: None Fruits/Vegs:Some Calcium: Some Iron:Some  Sleep: Bedtime: 10:30-11:00 pm Awakens: 7:00 am Sleep Concerns: Initiation/Maintenance/Other: No problems reported by patient.   Individual Medical History/Review of System Changes? None reported recently, no seasonal allergy.   Allergies: Patient has no known allergies.  Current Medications:  Current Outpatient Prescriptions:  .  busPIRone (BUSPAR) 30 MG tablet, Take 1 tablet (30 mg total) by mouth daily., Disp: 30 tablet, Rfl: 2 .  methylphenidate (METADATE CD) 40 MG CR capsule, Take 1 capsule (40 mg total) by mouth daily., Disp: 30 capsule, Rfl: 0 .  sertraline (ZOLOFT) 25 MG tablet, TAKE 1 TO 2 TABLETS BY MOUTH AS DIRECTED, Disp: 60 tablet, Rfl: 2 Medication Side  Effects: None per patient.  Family Medical/Social History Changes?: None-Reported recently  MENTAL HEALTH: Mental Health Issues: Anxiety-Zoloft 25 mg with symptom control. No reported suicidal thoughts or ideations.   PHYSICAL EXAM: Vitals:  Today's Vitals   10/21/16 1421  Weight: 115 lb 12.8 oz (52.5 kg)  Height: 5' 5.75" (1.67 m)  , Facility age limit for growth percentiles is 20 years.  General Exam: Physical Exam  Constitutional: He is oriented to person, place, and time. He appears well-developed and well-nourished.  HENT:  Head: Normocephalic and atraumatic.  Right Ear: External ear normal.  Left Ear: External ear normal.  Nose: Nose normal.  Mouth/Throat: Oropharynx is clear and moist.  Eyes: Conjunctivae and EOM are normal. Pupils are equal, round, and reactive to light.  Neck: Trachea normal, normal range of motion and full passive range of motion without pain. Neck supple.  Cardiovascular: Normal rate, regular rhythm, normal heart sounds and intact distal pulses.   Pulmonary/Chest: Effort normal and breath sounds normal.  Abdominal: Soft. Bowel sounds are normal.  Musculoskeletal: Normal range of motion.  Neurological: He is alert and oriented to person, place, and time. He has normal reflexes.  Skin: Skin is warm, dry and intact. Capillary refill takes less than 2 seconds.  Psychiatric: He has a normal mood and affect. His behavior is normal. Judgment and thought content normal.  Vitals reviewed.  No concerns for toileting. Daily stool, no constipation or diarrhea. Void urine no difficulty. No enuresis.   Participate in daily oral hygiene to include brushing and flossing.  Neurological: oriented to time, place, and person Cranial Nerves: normal  Neuromuscular:  Motor Mass: Normal Tone: Normal Strength: Normal DTRs: 2+ and symmetric Overflow: None Reflexes: no tremors noted Sensory Exam: Vibratory: Intact  Fine Touch: Intact  Testing/Developmental Screens:   0/0  scored by patient and reviewed     DIAGNOSES:    ICD-9-CM ICD-10-CM   1. ADHD (attention deficit hyperactivity disorder), combined type 314.01 F90.2   2. Generalized anxiety disorder 300.02 F41.1 sertraline (ZOLOFT) 25 MG tablet  3. Dysgraphia 781.3 R27.8     RECOMMENDATIONS: 3 month follow up and continuation with medication. Continued with Metadate CD 40 mg 1 daily, # 30 script with no refills, Zoloft 25 mg 1-2 daily, 3 60 with 2 refills, and Buspar 30 mg 1 daily, # 30 with 2 refills.   Continuation of daily oral hygiene to include flossing and brushing daily, using antimicrobial toothpaste, as well as routine dental exams and twice yearly cleaning.  Recommend supplementation with a multivitamin and omega-3 fatty acids daily.  Maintain adequate intake of Calcium and Vitamin D.  Exercise to continue daily, walking on campus, or other forms of exercise encouraged.   NEXT APPOINTMENT: Return in about 3 months (around 01/19/2017) for follow up visit.  More than 50% of the appointment was spent counseling and discussing diagnosis and management of symptoms with the patient and family.  Carron Curie, NP Counseling Time: 30 mins Total Contact Time: 40 mins

## 2016-10-21 NOTE — Patient Instructions (Signed)
Continuation of daily oral hygiene to include flossing and brushing daily, using antimicrobial toothpaste, as well as routine dental exams and twice yearly cleaning.  Recommend supplementation with amultivitamin and omega-3 fatty acids daily.  Maintain adequate intake of Calcium and Vitamin D. 

## 2016-10-24 ENCOUNTER — Institutional Professional Consult (permissible substitution): Payer: Managed Care, Other (non HMO) | Admitting: Family

## 2016-11-17 ENCOUNTER — Other Ambulatory Visit: Payer: Self-pay | Admitting: Family

## 2016-11-17 DIAGNOSIS — F902 Attention-deficit hyperactivity disorder, combined type: Secondary | ICD-10-CM

## 2016-11-17 MED ORDER — METHYLPHENIDATE HCL ER (CD) 40 MG PO CPCR: 40.0000 mg | ORAL_CAPSULE | Freq: Every day | ORAL | 0 refills | Status: DC

## 2016-11-17 NOTE — Telephone Encounter (Signed)
Prescriptions with refills were written 10/21/2016 for buspirone and zoloft. Attempted to call mom and leave message but no answer Printed Rx for Metadate CD 40 and placed at front desk for pick-up

## 2016-11-17 NOTE — Telephone Encounter (Signed)
Mom called in for a refill request no changes for buspirone 30mg ,Metadate CD 40 mg Cr and Zoloft 25 mg .Patient has appointment on 01/15/2017 @ 2pm.

## 2016-12-15 ENCOUNTER — Other Ambulatory Visit: Payer: Self-pay | Admitting: Family

## 2016-12-15 DIAGNOSIS — F902 Attention-deficit hyperactivity disorder, combined type: Secondary | ICD-10-CM

## 2016-12-15 MED ORDER — METHYLPHENIDATE HCL ER (CD) 40 MG PO CPCR: 40.0000 mg | ORAL_CAPSULE | Freq: Every day | ORAL | 0 refills | Status: DC

## 2016-12-15 NOTE — Telephone Encounter (Signed)
Printed Rx for Metadate CD 40mg   and placed at front desk for pick-up

## 2016-12-15 NOTE — Telephone Encounter (Signed)
Mom called for refill for Concerta.  Patient last seen 10/21/16, next appointment 01/15/17.

## 2016-12-18 ENCOUNTER — Other Ambulatory Visit: Payer: Self-pay | Admitting: Pediatrics

## 2016-12-18 DIAGNOSIS — F411 Generalized anxiety disorder: Secondary | ICD-10-CM

## 2017-01-15 ENCOUNTER — Encounter: Payer: Self-pay | Admitting: Family

## 2017-01-15 ENCOUNTER — Ambulatory Visit (INDEPENDENT_AMBULATORY_CARE_PROVIDER_SITE_OTHER): Payer: 59 | Admitting: Family

## 2017-01-15 VITALS — BP 100/62 | HR 68 | Resp 16 | Ht 65.75 in | Wt 116.8 lb

## 2017-01-15 DIAGNOSIS — Z79899 Other long term (current) drug therapy: Secondary | ICD-10-CM | POA: Diagnosis not present

## 2017-01-15 DIAGNOSIS — R278 Other lack of coordination: Secondary | ICD-10-CM

## 2017-01-15 DIAGNOSIS — F411 Generalized anxiety disorder: Secondary | ICD-10-CM | POA: Diagnosis not present

## 2017-01-15 DIAGNOSIS — F902 Attention-deficit hyperactivity disorder, combined type: Secondary | ICD-10-CM | POA: Diagnosis not present

## 2017-01-15 MED ORDER — SERTRALINE HCL 25 MG PO TABS: ORAL_TABLET | ORAL | 1 refills | Status: DC

## 2017-01-15 MED ORDER — BUSPIRONE HCL 30 MG PO TABS: 30.0000 mg | ORAL_TABLET | Freq: Every day | ORAL | 2 refills | Status: DC

## 2017-01-15 MED ORDER — METHYLPHENIDATE HCL ER (CD) 40 MG PO CPCR: 40.0000 mg | ORAL_CAPSULE | Freq: Every day | ORAL | 0 refills | Status: DC

## 2017-01-15 NOTE — Progress Notes (Signed)
[facility] [address], [place] [place] [address] Dept: [phone] Dept Fax: [phone] Loc: [phone] Loc Fax: [phone]  Medical Follow-up  Patient ID: Michael [name], male  DOB: 1991/03/19, 26 y.o.  MRN: [id number]  Date of Evaluation: 01/15/17  PCP: Georgann Housekeeper, MD  Accompanied by: self Patient Lives with: parents  HISTORY/CURRENT STATUS:  HPI  Patient here for routine follow up related to ADHD/Anxiety and medication management. Patient here with mother in the waiting room for today's visit. Patient completing his spring semester the end of this month. Will also take summer classes for his major and continue with Sociology classes. Cooperative and verbal with increased amount of knowledge sociological theories discussed with provider today. Patient has continued with take Zoloft 25 mg 1-2 tablets daily, Buspar 30 mg daily, and Metadate CD 40 mg daily without side effects reported.   EDUCATION: School: [employer]/Math/German Year/Grade: College Homework Time: Depending on class demands Performance/Grades: average Services: IEP/504 Plan and Other: tutoring as needed Activities/Exercise: IEP/504 Plan and Other: OARS for accommodations  MEDICAL HISTORY: Appetite: Good MVI/Other: None Fruits/Vegs:Some Calcium: Some Iron:Some  Sleep: Bedtime: 10:30-11:00 pm  Awakens: 7:00 am Sleep Concerns: Initiation/Maintenance/Other: No problems reported by patient.   Individual Medical History/Review of System Changes? None reported recently.   Allergies: Patient has no known allergies.  Current Medications:  Current Outpatient Prescriptions:  .  busPIRone (BUSPAR) 30 MG tablet, Take 1 tablet (30 mg total) by mouth daily., Disp: 30 tablet, Rfl: 2 .  methylphenidate (METADATE CD) 40 MG CR capsule, Take 1 capsule (40 mg  total) by mouth daily., Disp: 30 capsule, Rfl: 0 .  sertraline (ZOLOFT) 25 MG tablet, TAKE 1 TO 2 TABLETS BY MOUTH AS DIRECTED, Disp: 60 tablet, Rfl: 1 Medication Side Effects: None  Family Medical/Social History Changes?: No  MENTAL HEALTH: Mental Health Issues: Anxiety-Zoloft daily with decreased level of anxiety. No suicidal thoughts or ideations reported by patient.   PHYSICAL EXAM: Vitals:  Today's Vitals   01/15/17 1435  BP: 100/62  Pulse: 68  Resp: 16  Weight: 116 lb 12.8 oz (53 kg)  Height: 5' 5.75" (1.67 m)  PainSc: 0-No pain  , Facility age limit for growth percentiles is 20 years.  General Exam: Physical Exam  Constitutional: He is oriented to person, place, and time. He appears well-developed and well-nourished.  HENT:  Head: Normocephalic and atraumatic.  Right Ear: External ear normal.  Left Ear: External ear normal.  Nose: Nose normal.  Mouth/Throat: Oropharynx is clear and moist.  Eyes: Conjunctivae and EOM are normal. Pupils are equal, round, and reactive to light.  Neck: Trachea normal, normal range of motion and full passive range of motion without pain. Neck supple.  Cardiovascular: Normal rate, regular rhythm, normal heart sounds and intact distal pulses.   Pulmonary/Chest: Effort normal and breath sounds normal.  Abdominal: Soft. Bowel sounds are normal.  Genitourinary:  Genitourinary Comments: Deferred  Musculoskeletal: Normal range of motion.  Neurological: He is alert and oriented to person, place, and time. He has normal reflexes.  Skin: Skin is warm, dry and intact. Capillary refill takes less than 2 seconds.  Psychiatric: He has a normal mood and affect. His behavior is normal. Judgment and thought content normal.  Vitals reviewed.  Review of Systems  Psychiatric/Behavioral: Positive for decreased concentration. The patient is nervous/anxious.   All other systems reviewed and are negative.  No concerns for toileting. Daily stool,  no  constipation or diarrhea. Void urine no difficulty. No enuresis.   Participate in daily oral hygiene to include brushing and flossing.  Neurological: oriented to time, place, and person Cranial Nerves: normal  Neuromuscular:  Motor Mass: Normal Tone: Normal Strength: Normal DTRs: 2+ and symmetric Overflow: None Reflexes: no tremors noted Sensory Exam: Vibratory: Intact  Fine Touch: Intact  Testing/Developmental Screens:  ASRS- 0/0 scored by patient and reviewed   DIAGNOSES:    ICD-9-CM ICD-10-CM   1. ADHD (attention deficit hyperactivity disorder), combined type 314.01 F90.2 methylphenidate (METADATE CD) 40 MG CR capsule  2. Generalized anxiety disorder 300.02 F41.1 sertraline (ZOLOFT) 25 MG tablet  3. Dysgraphia 781.3 R27.8   4. Medication management V58.69 Z79.899     RECOMMENDATIONS: 3 month follow up and continuation of medication. Patient to continue with Zoloft 25 mg 1-2 daily with 1 RF and Buspar 30 mg 1 daily, # 30 with 2 RF's, both escribed to [facility]. Printed refill of Metadate CD 40 mg daily, # 30 with no refills given to patient.   Reviewed anxiety and level that patient is able to handle any increased stressors. Can follow up with counseling on campus or privately if increased amount of anxiety symptoms persist.   Reviewed plans for graduation from college and career goals with support provided to patient.   Follow up with primary care provider for routine physical exam and blood work.   Continuation of daily oral hygiene to include flossing and brushing daily, using antimicrobial toothpaste, as well as routine dental exams and twice yearly cleaning.  Recommend supplementation with a multivitamin and omega-3 fatty acids daily.  Maintain adequate intake of Calcium and Vitamin D.  NEXT APPOINTMENT: Return in about 3 months (around 04/16/2017) for follow up visit.  More than 50% of the appointment was spent counseling and discussing diagnosis and management of  symptoms with the patient and family.  Carron Curie, NP Counseling Time: 30 mins Total Contact Time: 40 mins

## 2017-02-16 ENCOUNTER — Other Ambulatory Visit: Payer: Self-pay | Admitting: Family

## 2017-02-16 DIAGNOSIS — F902 Attention-deficit hyperactivity disorder, combined type: Secondary | ICD-10-CM

## 2017-02-16 MED ORDER — METHYLPHENIDATE HCL ER (CD) 40 MG PO CPCR: 40.0000 mg | ORAL_CAPSULE | Freq: Every day | ORAL | 0 refills | Status: DC

## 2017-02-16 NOTE — Telephone Encounter (Signed)
Mom called for refill, did not specify medication.  Patient last seen 01/15/17, next appointment 04/17/17.

## 2017-02-16 NOTE — Telephone Encounter (Signed)
Printed Rx for Metadate CD 40 mg and placed at front desk for pick-up

## 2017-03-18 ENCOUNTER — Other Ambulatory Visit: Payer: Self-pay | Admitting: Family

## 2017-03-18 DIAGNOSIS — F902 Attention-deficit hyperactivity disorder, combined type: Secondary | ICD-10-CM

## 2017-03-18 MED ORDER — METHYLPHENIDATE HCL ER (CD) 40 MG PO CPCR: 40.0000 mg | ORAL_CAPSULE | Freq: Every day | ORAL | 0 refills | Status: DC

## 2017-03-18 NOTE — Telephone Encounter (Signed)
Printed Rx and placed at front desk for pick-up  

## 2017-03-18 NOTE — Telephone Encounter (Signed)
Mom called for refill for Concerta.  Patient last seen 01/15/17, next appointment 04/17/17.

## 2017-03-25 ENCOUNTER — Telehealth: Payer: Self-pay | Admitting: Family

## 2017-03-25 MED ORDER — METHYLPHENIDATE HCL ER (CD) 20 MG PO CPCR: 20.0000 mg | ORAL_CAPSULE | Freq: Every day | ORAL | 0 refills | Status: DC

## 2017-03-25 NOTE — Telephone Encounter (Signed)
T/C with father for refill to last until August 1st. [place] in [place] a [place] and tried to get approval for over # 30 is impossible, per father. Mother picked up # 30 script for Metadate CD 40 mg daily yesterday. Will print Metadate CD 20 mg (2 daily) for 2 weeks to supplement the other 2 weeks needed until back in [place].

## 2017-04-09 ENCOUNTER — Institutional Professional Consult (permissible substitution): Payer: 59 | Admitting: Family

## 2017-04-17 ENCOUNTER — Institutional Professional Consult (permissible substitution): Payer: 59 | Admitting: Family

## 2017-05-07 ENCOUNTER — Other Ambulatory Visit: Payer: Self-pay | Admitting: Family

## 2017-05-07 DIAGNOSIS — F411 Generalized anxiety disorder: Secondary | ICD-10-CM

## 2017-05-15 ENCOUNTER — Encounter: Payer: Self-pay | Admitting: Family

## 2017-05-15 ENCOUNTER — Ambulatory Visit (INDEPENDENT_AMBULATORY_CARE_PROVIDER_SITE_OTHER): Payer: BLUE CROSS/BLUE SHIELD | Admitting: Family

## 2017-05-15 VITALS — BP 102/62 | HR 68 | Resp 16 | Ht 65.75 in | Wt 109.4 lb

## 2017-05-15 DIAGNOSIS — F411 Generalized anxiety disorder: Secondary | ICD-10-CM | POA: Diagnosis not present

## 2017-05-15 DIAGNOSIS — Z719 Counseling, unspecified: Secondary | ICD-10-CM | POA: Diagnosis not present

## 2017-05-15 DIAGNOSIS — R278 Other lack of coordination: Secondary | ICD-10-CM

## 2017-05-15 DIAGNOSIS — Z79899 Other long term (current) drug therapy: Secondary | ICD-10-CM | POA: Diagnosis not present

## 2017-05-15 DIAGNOSIS — F902 Attention-deficit hyperactivity disorder, combined type: Secondary | ICD-10-CM

## 2017-05-15 MED ORDER — METHYLPHENIDATE HCL ER (CD) 20 MG PO CPCR: 20.0000 mg | ORAL_CAPSULE | Freq: Every day | ORAL | 0 refills | Status: DC

## 2017-05-15 MED ORDER — BUSPIRONE HCL 30 MG PO TABS: 30.0000 mg | ORAL_TABLET | Freq: Every day | ORAL | 2 refills | Status: DC

## 2017-05-15 NOTE — Progress Notes (Signed)
[facility] [address], [address] [place] [address] Dept: [phone] Dept Fax: [phone] Loc: [phone] Loc Fax: [phone]  Medical Follow-up  Patient ID: Michael [name], male  DOB: 1991-09-12, 26 y.o.  MRN: [id number]  Date of Evaluation: 05/15/17  PCP: Georgann HousekeeperHusain, Karrar, MD  Accompanied by: self and mother in waiting room Patient Lives with: parents  HISTORY/CURRENT STATUS:  HPI  Patient here for routine follow up related to ADHD, Anxiety, and medication management. Patient here with mother in the waiting room during the follow up visit with patient. Patient cooperative and interactive with provider [place]. [employer] with minimal class requirements to finish his degree, then on to [employer] program possibly [place]. To continue with Metadate CD, Buspar, and Zoloft with no reported side effects by patient.   EDUCATION: School: [employer]/Criminology/Math/German  Year/Grade: college  Performance/Grades: above average Services: Other: Academic resources Activities/Exercise: intermittently  MEDICAL HISTORY: Appetite: Good MVI/Other: None Fruits/Vegs:Some Calcium: some Iron:some  Sleep: Bedtime: 8-10 hours/night Sleep Concerns: Initiation/Maintenance/Other: No problems reported recent.  Individual Medical History/Review of System Changes? None reported recently.   Allergies: Patient has no known allergies.  Current Medications:  Current Outpatient Prescriptions:  .  busPIRone (BUSPAR) 30 MG tablet, Take 1 tablet (30 mg total) by mouth daily., Disp: 30 tablet, Rfl: 2 .  methylphenidate (METADATE CD) 20 MG CR capsule, Take 1-2 capsules (20-40 mg total) by mouth daily., Disp: 60 capsule, Rfl: 0 .  sertraline (ZOLOFT) 25 MG tablet, TAKE 1 TO 2 TABLETS AS DIRECTED, Disp: 60 tablet, Rfl:  2 Medication Side Effects: None  Family Medical/Social History Changes?: None reported by patient  MENTAL HEALTH: Mental Health Issues: Anxiety-Zoloft 1 tablet daily with no side effects. No suicidal thoughts or ideations reported.   PHYSICAL EXAM: Vitals:  Today's Vitals   05/15/17 0914  BP: 102/62  Pulse: 68  Resp: 16  Weight: 109 lb 6.4 oz (49.6 kg)  Height: 5' 5.75" (1.67 m)  PainSc: 0-No pain  , Facility age limit for growth percentiles is 20 years.  General Exam: Physical Exam  Constitutional: He is oriented to person, place, and time. He appears well-developed and well-nourished.  HENT:  Head: Normocephalic and atraumatic.  Right Ear: External ear normal.  Left Ear: External ear normal.  Nose: Nose normal.  Mouth/Throat: Oropharynx is clear and moist.  Eyes: Pupils are equal, round, and reactive to light. Conjunctivae and EOM are normal.  Neck: Trachea normal, normal range of motion and full passive range of motion without pain. Neck supple.  Cardiovascular: Normal rate, regular rhythm, normal heart sounds and intact distal pulses.   Pulmonary/Chest: Effort normal and breath sounds normal.  Abdominal: Soft. Bowel sounds are normal.  Genitourinary:  Genitourinary Comments: Deferred  Musculoskeletal: Normal range of motion.  Neurological: He is alert and oriented to person, place, and time. He has normal reflexes.  Skin: Skin is warm, dry and intact. Capillary refill takes less than 2 seconds.  Psychiatric: He has a normal mood and affect. His behavior is normal. Judgment and thought content normal.  Vitals reviewed.  No concerns for toileting. Daily stool, no constipation or diarrhea. Void urine no difficulty. No enuresis.   Participate in daily oral hygiene to include brushing and flossing.  Neurological: oriented to time, place, and person Cranial Nerves: normal  Neuromuscular:  Motor Mass: Normal Tone: Normal Strength: Normal DTRs: 2+  and  symmetric Overflow: None Reflexes: no tremors noted Sensory Exam: Vibratory: Intact  Fine Touch: Intact  Testing/Developmental Screens:  ASRS-0/0 scored by paitent and counseled patient daily  DIAGNOSES:    ICD-10-CM   1. ADHD (attention deficit hyperactivity disorder), combined type F90.2   2. Generalized anxiety disorder F41.1   3. Dysgraphia R27.8   4. Medication management Z79.899   5. Patient counseled Z71.9     RECOMMENDATIONS: 3 month follow up and continuation of medication. Patient to continue with medication management. Metadate CD 20 mg BID, # 60 script printed and given to patient. Written BID for lower cost to patient and can call for updates if need to change back to 40 mg daily dose. Buspar 30 mg daily 3 30 with 2 RF's, and RF was sent for 05/07/17 Zoloft 25 mg 1-2 daily with 2 RF's.   Information reviewed for school, classes this semester, and remaining classes for graduation. Patient plans to finish  3 degrees in the next 3 years and then will attend Master's Program in [place].   Recommended more physical exercise daily besides walking on campus. Patient not physically activity, but may consider other physical ways to get activity in on a daily basis.   Information reviewed related to healthy eating and better eating habits throughout the day on campus. To get at least 3 meals each day, small meals are recommended, but can also incorporate snacks.   Directed to f/u with PCP, dentist as recommended, MVI daily, proper sleep, exercise, and a healthy diet for health maintenance.   NEXT APPOINTMENT: Return in about 3 months (around 08/15/2017) for follow up visit.   More than 50% of the appointment was spent counseling and discussing diagnosis and management of symptoms with the patient and family.   Carron Curieawn M Paretta-Leahey, NP Counseling Time: 30 mins Total Contact Time: 40 mins

## 2017-06-17 ENCOUNTER — Other Ambulatory Visit: Payer: Self-pay | Admitting: Family

## 2017-06-17 MED ORDER — METHYLPHENIDATE HCL ER (CD) 20 MG PO CPCR: 20.0000 mg | ORAL_CAPSULE | Freq: Every day | ORAL | 0 refills | Status: DC

## 2017-06-17 NOTE — Telephone Encounter (Signed)
Printed Rx and placed at front desk for pick-up  

## 2017-06-17 NOTE — Telephone Encounter (Signed)
Mom called for refill, did not specify medication.  Patient last seen 05/15/17, next appointment 08/14/17.

## 2017-08-03 ENCOUNTER — Other Ambulatory Visit: Payer: Self-pay | Admitting: Family

## 2017-08-03 MED ORDER — METHYLPHENIDATE HCL ER (CD) 20 MG PO CPCR: 20.0000 mg | ORAL_CAPSULE | Freq: Every day | ORAL | 0 refills | Status: DC

## 2017-08-03 NOTE — Telephone Encounter (Signed)
Printed Rx for Metadate CD 20 mg and placed at front desk for pick-up

## 2017-08-03 NOTE — Telephone Encounter (Signed)
Mom called requesting a new RX for Concerta. Pt has an apt scheduled for 11.02.18. jd

## 2017-08-14 ENCOUNTER — Ambulatory Visit (INDEPENDENT_AMBULATORY_CARE_PROVIDER_SITE_OTHER): Payer: BLUE CROSS/BLUE SHIELD | Admitting: Family

## 2017-08-14 ENCOUNTER — Encounter: Payer: Self-pay | Admitting: Family

## 2017-08-14 VITALS — BP 118/78 | HR 78 | Resp 18 | Ht 65.75 in | Wt 114.8 lb

## 2017-08-14 DIAGNOSIS — F411 Generalized anxiety disorder: Secondary | ICD-10-CM | POA: Diagnosis not present

## 2017-08-14 DIAGNOSIS — Z79899 Other long term (current) drug therapy: Secondary | ICD-10-CM

## 2017-08-14 DIAGNOSIS — Z719 Counseling, unspecified: Secondary | ICD-10-CM | POA: Diagnosis not present

## 2017-08-14 DIAGNOSIS — R278 Other lack of coordination: Secondary | ICD-10-CM | POA: Diagnosis not present

## 2017-08-14 DIAGNOSIS — F902 Attention-deficit hyperactivity disorder, combined type: Secondary | ICD-10-CM

## 2017-08-14 MED ORDER — METHYLPHENIDATE HCL ER (CD) 20 MG PO CPCR: 20.0000 mg | ORAL_CAPSULE | Freq: Every day | ORAL | 0 refills | Status: DC

## 2017-08-14 MED ORDER — BUSPIRONE HCL 30 MG PO TABS: 30.0000 mg | ORAL_TABLET | Freq: Every day | ORAL | 2 refills | Status: DC

## 2017-08-14 MED ORDER — SERTRALINE HCL 25 MG PO TABS: 25.0000 mg | ORAL_TABLET | Freq: Every day | ORAL | 2 refills | Status: DC

## 2017-08-14 NOTE — Progress Notes (Signed)
[facility] [address] [place] [place] [address] Dept: [phone] Dept Fax: [phone] Loc: [phone] Loc Fax: [phone]  Medical Follow-up  Patient ID: Michael [name], male  DOB: [birth year], 26 y.o.  MRN: [id number]  Date of Evaluation: 08/14/17  PCP: Georgann Housekeeper, MD  Accompanied by: Self Patient Lives with: parents  HISTORY/CURRENT STATUS:  HPI  Patient here for routine follow up related to ADHD, Anxiety, Dysgraphia, and medication management. Patient here with mother in the waiting room and patient in the room by himself. Doing well at school with some minor issues being out of the class for 3 days and now trying to catch up. Patient has continued with Metadate CD 20 mg 1-2 capsule daily, Zoloft 25 mg daily, and Buspar 30 mg daily with no reported side effects.   EDUCATION: School: [employer] Year/Grade: College Homework Time: Increased amount of time Performance/Grades: average Services: Other: Academic resources Activities/Exercise: intermittently  MEDICAL HISTORY: Appetite: Good MVI/Other: Daily Fruits/Vegs:Some Calcium: Some Iron:Some  Sleep: Patient reports getting 6-7 hours/night of sleep Sleep Concerns: Initiation/Maintenance/Other: No problems reported by patient  Individual Medical History/Review of System Changes? Yes, recently sick and was seen by PCP for illness with Abx treatment.   Allergies: Patient has no known allergies.  Current Medications:  Current Outpatient Prescriptions:  .  busPIRone (BUSPAR) 30 MG tablet, Take 1 tablet (30 mg total) by mouth daily., Disp: 30 tablet, Rfl: 2 .  methylphenidate (METADATE CD) 20 MG CR capsule, Take 1-2 capsules (20-40 mg total) by mouth daily. Do not fill until after 10/14/17, Disp: 60 capsule, Rfl: 0 .  sertraline (ZOLOFT) 25 MG tablet,  Take 1 tablet (25 mg total) by mouth daily., Disp: 30 tablet, Rfl: 2 Medication Side Effects: None  Family Medical/Social History Changes?: None reported recently  MENTAL HEALTH: Mental Health Issues: Anxiety-Better with Zoloft and Buspar  PHYSICAL EXAM: Vitals:  Today's Vitals   08/14/17 1412  BP: 118/78  Pulse: 78  Resp: 18  Weight: 114 lb 12.8 oz (52.1 kg)  Height: 5' 5.75" (1.67 m)  PainSc: 0-No pain  , Facility age limit for growth percentiles is 20 years.  General Exam: Physical Exam  Constitutional: He is oriented to person, place, and time. He appears well-developed and well-nourished.  HENT:  Head: Normocephalic and atraumatic.  Right Ear: External ear normal.  Left Ear: External ear normal.  Nose: Nose normal.  Mouth/Throat: Oropharynx is clear and moist.  Eyes: Pupils are equal, round, and reactive to light. Conjunctivae and EOM are normal.  Corrective lenses  Neck: Trachea normal, normal range of motion and full passive range of motion without pain. Neck supple.  Cardiovascular: Normal rate, regular rhythm, normal heart sounds and intact distal pulses.   Pulmonary/Chest: Effort normal and breath sounds normal.  Abdominal: Soft. Bowel sounds are normal.  Genitourinary:  Genitourinary Comments: Deferred  Musculoskeletal: Normal range of motion.  Neurological: He is alert and oriented to person, place, and time. He has normal reflexes.  Skin: Skin is warm, dry and intact. Capillary refill takes less than 2 seconds.  Psychiatric: He has a normal mood and affect. His behavior is normal. Judgment and thought content normal.  Vitals reviewed.  Review of Systems  All other systems reviewed and are negative.  Patient with no concerns for toileting. Daily stool, no constipation or diarrhea. Void urine no difficulty. No enuresis.   Participate in daily oral hygiene to  include brushing and flossing.  Neurological: oriented to time, place, and person Cranial Nerves:  normal  Neuromuscular:  Motor Mass: Normal Tone: Normal Strength: Normal DTRs: 2+ and symmetric Overflow: None Reflexes: no tremors noted Sensory Exam: Vibratory: Intact  Fine Touch: Intact  Testing/Developmental Screens:  ASRS-0/0 scored by patient and counseled   DIAGNOSES:    ICD-10-CM   1. ADHD (attention deficit hyperactivity disorder), combined type F90.2   2. Generalized anxiety disorder F41.1 sertraline (ZOLOFT) 25 MG tablet  3. Dysgraphia R27.8   4. Patient counseled Z71.9   5. Medication management Z79.899     RECOMMENDATIONS: 3 month follow up and counseled on medication management. Metadate CD 20 mg 2 capsules daily, # 60 with no refill scripts printed. Three prescriptions provided, two with fill after dates for 09/13/17 and 10/14/17. Buspar 30 mg tablets daily # 30 with 2 RF's and Zoloft 25 mg daily, # 30 with [facility]   Information regarding school schedule and classes this semester reviewed with patient. Looking at options for next semester and attempting to finish this semester strong since he was out for 3 important days missing increased amount of information.  Counseled on healthy eating and getting regular exercise daily. Discussed regular exercise at least 3 days weekly for heart health. Suggestions on how to move regularly and walking more on campus this semester.   Recommended good sleep hygiene for age needs. Needs for 8-10 hours of uninterrupted sleep each night.  Directed patient to f/u with PCP for yearly check up, dentist every 6 months, MVI daily, Sleep Hygiene, healthy eating and exercise, and eye exam for routine check to continue with health promotion.  NEXT APPOINTMENT: Return in about 3 months (around 11/14/2017) for follow up visit.  More than 50% of the appointment was spent counseling and discussing diagnosis and management of symptoms with the patient and family.  Carron Curieawn M Paretta-Leahey, NP Counseling Time: 30 mins Total  Contact Time: 40 mins

## 2017-11-12 ENCOUNTER — Institutional Professional Consult (permissible substitution): Payer: BLUE CROSS/BLUE SHIELD | Admitting: Family

## 2017-11-16 ENCOUNTER — Encounter: Payer: Self-pay | Admitting: Family

## 2017-11-16 ENCOUNTER — Ambulatory Visit (INDEPENDENT_AMBULATORY_CARE_PROVIDER_SITE_OTHER): Payer: BLUE CROSS/BLUE SHIELD | Admitting: Family

## 2017-11-16 VITALS — BP 104/66 | HR 76 | Resp 16 | Ht 65.75 in | Wt 111.2 lb

## 2017-11-16 DIAGNOSIS — Z79899 Other long term (current) drug therapy: Secondary | ICD-10-CM | POA: Diagnosis not present

## 2017-11-16 DIAGNOSIS — F902 Attention-deficit hyperactivity disorder, combined type: Secondary | ICD-10-CM | POA: Diagnosis not present

## 2017-11-16 DIAGNOSIS — Z719 Counseling, unspecified: Secondary | ICD-10-CM | POA: Diagnosis not present

## 2017-11-16 DIAGNOSIS — R278 Other lack of coordination: Secondary | ICD-10-CM

## 2017-11-16 DIAGNOSIS — F411 Generalized anxiety disorder: Secondary | ICD-10-CM

## 2017-11-16 MED ORDER — METHYLPHENIDATE HCL ER (CD) 20 MG PO CPCR: 20.0000 mg | ORAL_CAPSULE | Freq: Every day | ORAL | 0 refills | Status: DC

## 2017-11-16 MED ORDER — SERTRALINE HCL 25 MG PO TABS: 25.0000 mg | ORAL_TABLET | Freq: Every day | ORAL | 2 refills | Status: DC

## 2017-11-16 MED ORDER — BUSPIRONE HCL 30 MG PO TABS: 30.0000 mg | ORAL_TABLET | Freq: Every day | ORAL | 2 refills | Status: DC

## 2017-11-16 NOTE — Progress Notes (Signed)
[facility]  [facility] [address] [place] [address] Dept: [phone] Dept Fax: [phone] Loc: [phone] Loc Fax: [phone]  Medical Follow-up  Patient ID: Michael [name] , male  DOB: Dec 07, 1990, 27 y.o.  MRN: [id number]  Date of Evaluation:11/16/2017  PCP: Michael [name], Karrar, MD  Accompanied by: Self Patient Lives with: parents  HISTORY/CURRENT STATUS:  HPI  Patient here for routine follow up related to ADHD, Dysgraphia, learning problems, and medication management. Patient here by himself for today's visit. Patient has continued to attend [employer] for the spring semester. Taking several classes with math and science this semester. Doing well with no issues on current medication with side effects. Has reported 2 recent panic attacks: 1 at school and one on the way home in the car when it was dark outside. Wanting to look at medication management at today's visit for his anxiety.  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 Now better with meal plan.  MVI/Other: Daily Fruits/Vegs:some Calcium: some Iron:some  Sleep: Patient reports getting about 8 hours/night most night.  Sleep Concerns: Initiation/Maintenance/Other: No problem with sleep   Individual Medical History/Review of System Changes? None reported by patient recently. Has had 2 panic attacks recently related to school and darkness when riding in the car.  Allergies: Patient has no known allergies.  Current Medications:  Current Outpatient Medications:  .  busPIRone (BUSPAR) 30 MG tablet, Take 1 tablet (30 mg total) by mouth daily., Disp: 30  tablet, Rfl: 2 .  methylphenidate (METADATE CD) 20 MG CR capsule, Take 1-2 capsules (20-40 mg total) by mouth daily. Do not fill until after 12/11/17, Disp: 60 capsule, Rfl: 0 .  sertraline (ZOLOFT) 25 MG tablet, Take 1-2 tablets (25-50 mg total) by mouth daily., Disp: 60 tablet, Rfl: 2 Medication Side Effects: None  Family Medical/Social History Changes?: Father diagnosed with heart disease recently.   MENTAL HEALTH: Mental Health Issues: Anxiety with 2 recent panic attacks, has continued with Zoloft and Buspar. No suicidal thoughts or ideations.   PHYSICAL EXAM: Vitals:  Today's Vitals   11/16/17 1412  Resp: 16  Weight: 111 lb 3.2 oz (50.4 kg)  Height: 5' 5.75" (1.67 [name])  PainSc: 0-No pain  , Facility age limit for growth percentiles is 20 years.  General Exam: Physical Exam  Constitutional: He is oriented to person, place, and time. He appears well-developed and well-nourished.  HENT:  Head: Normocephalic and atraumatic.  Right Ear: External ear normal.  Left Ear: External ear normal.  Nose: Nose normal.  Mouth/Throat: Oropharynx is clear and moist.  Eyes: Conjunctivae and EOM are normal. Pupils are equal, round, and reactive to light.  Corrective lenses  Neck: Trachea normal, normal range of motion and full passive range of motion without pain. Neck supple.  Cardiovascular: Normal rate, regular rhythm, normal heart sounds and intact distal pulses.  Pulmonary/Chest: Effort normal and breath sounds normal.  Abdominal: Soft. Bowel sounds are normal.  Genitourinary:  Genitourinary Comments: Deferred  Musculoskeletal: Normal range of motion.  Neurological: He is alert and oriented to person, place, and time. He has normal reflexes.  Skin: Skin is warm, dry and intact. Capillary refill takes less than 2 seconds.  Psychiatric: He has a normal mood and affect. His behavior is normal. Judgment and thought  content normal.  Vitals reviewed.  Review of Systems    Psychiatric/Behavioral: The patient is nervous/anxious.   All other systems reviewed and are negative.  Patient with no concerns for toileting. Daily stool, no constipation or diarrhea. Void urine no difficulty. No enuresis.   Participate in daily oral hygiene to include brushing and flossing.  Neurological: oriented to time, place, and person Cranial Nerves: normal  Neuromuscular:  Motor Mass: Normal  Tone: Normal  Strength: Normal  DTRs: 2+ and symmetric Overflow: None Reflexes: no tremors noted Sensory Exam: Vibratory: Intact  Fine Touch: Intact  Testing/Developmental Screens:  ASRS-not completed by patient today. Counseled at today's visit regarding his concerns.   DIAGNOSES:    ICD-10-CM   1. ADHD (attention deficit hyperactivity disorder), combined type F90.2   2. Generalized anxiety disorder F41.1 sertraline (ZOLOFT) 25 MG tablet  3. Dysgraphia R27.8   4. Patient counseled Z71.9   5. Medication management Z79.899     RECOMMENDATIONS: 3 month follow up and counseled on medication. Counseled on medication management and adherence. Continue with Metadate CD 20 mg 1-2 capsules daily, # 60 script printed and one extra Rx printed for post dates for 12/11/17. Buspar 30 mg daily, # 30 rx with [facility] and increased dose of Zoloft 25-50 mg daily, # 60 with 2 RF's [facility]  Reviewed old records and/or current chart since last f/u visit in November. Only change is 2 recent panic attacks with the start of spring semester.   Discussed recent history and today's examination with questions answered and concerns regarding anxiety addressed with patient.   Counseled regarding anticipatory guidance with school this semester and more anxiety with the start of spring semester.   Recommended a high protein, low sugar and preservatives diet for ADHD patient with avoiding junk or fast foods. Smaller and more frequent meals recommended throughout the  day.   Counseled on the need to increase exercise and make healthy eating choices daily. Suggested other options for exercise, but walking on campus at this time is his daily activity.  Discussed school progress and advocated for appropriate accommodations/modifications as needed on the college campus.   Advised on medication options, administration, effects, and possible side effects with increasing his Zoloft to 50 mg daily. May also consider taking his Buspar in the evenings since only taking an am dose right now.   Instructed on the importance of good sleep hygiene, a routine bedtime, no TV in bedroom along with turning off all screens at least 1 hour befor bedtime.   Directed patient to f/u with PCP as needed, eye exam yearly, dental visit as recommended, MVI daily, better eating habits, more exercise and better sleep routine.   NEXT APPOINTMENT: Return in about 3 months (around 02/13/2018) for follow up visit.  More than 50% of the appointment was spent counseling and discussing diagnosis and management of symptoms with the patient and family.  Carron Curie, NP Counseling Time: 30 mins Total Contact Time: 40 mins

## 2017-12-16 ENCOUNTER — Telehealth: Payer: Self-pay | Admitting: Family

## 2017-12-16 MED ORDER — AMPHETAMINE SULFATE 10 MG PO TABS: 10.0000 mg | ORAL_TABLET | Freq: Every day | ORAL | 0 refills | Status: DC

## 2017-12-16 NOTE — Telephone Encounter (Signed)
T/C with patient regarding changing stimulants due to increased anxiety. To stop the Metadate CD 20 mg and change to Evekeo 10  mg daily, # [facility]

## 2017-12-16 NOTE — Telephone Encounter (Signed)
[facility] for Amphetamine Sulfate 10 mg.  Patient last seen 11/16/17, next appointment 02/15/18.

## 2017-12-17 NOTE — Telephone Encounter (Signed)
PA submitted via Cover My Meds.   This request has received a Favorable outcome from [employer] [place].  Please keep in mind this is not a guarantee of payment. Eligibility and Benefit determinations will be made at the time of service.  Please note any additional information provided by [employer] [place] at the bottom of the screen. 

## 2017-12-25 ENCOUNTER — Telehealth: Payer: Self-pay | Admitting: Family

## 2017-12-25 MED ORDER — BUSPIRONE HCL 30 MG PO TABS: 30.0000 mg | ORAL_TABLET | Freq: Two times a day (BID) | ORAL | 2 refills | Status: DC

## 2017-12-25 NOTE — Telephone Encounter (Signed)
RX for Buspar 30 mg BID, # 60 with 2 RF's [facility] [address]

## 2018-02-15 ENCOUNTER — Institutional Professional Consult (permissible substitution): Payer: BLUE CROSS/BLUE SHIELD | Admitting: Family

## 2018-03-04 ENCOUNTER — Other Ambulatory Visit: Payer: Self-pay | Admitting: Family

## 2018-03-04 ENCOUNTER — Other Ambulatory Visit: Payer: Self-pay

## 2018-03-04 DIAGNOSIS — F411 Generalized anxiety disorder: Secondary | ICD-10-CM

## 2018-03-04 MED ORDER — AMPHETAMINE SULFATE 10 MG PO TABS: 10.0000 mg | ORAL_TABLET | Freq: Every day | ORAL | 0 refills | Status: DC

## 2018-03-04 NOTE — Telephone Encounter (Signed)
Mom called in for refill for Evekeo. Last visit 11/16/2017 next 03/23/2018. [facility] [address]

## 2018-03-04 NOTE — Telephone Encounter (Signed)
Zoloft 25 mg daily, # 60 with 2 RF's. RX for above e-scribed and sent to pharmacy on record  [id number] [place], [place] - [address] OF [facility] [address] [place] [address] Phone: [phone] Fax: [phone]  [id number] [address], [place] - [address] [place] [address] Phone: [phone] Fax: [phone]

## 2018-03-04 NOTE — Telephone Encounter (Signed)
Evekeo 10 mg daily, 3 30 with no refills. RX for above e-scribed and sent to pharmacy on record  [id number] , [place] - [address] AT [address] OF [facility] [address] [place] [address] Phone: [phone] Fax: [phone]  [id number] [address] [place] - [address] [place] [address] Phone: [phone] Fax: [phone]

## 2018-03-23 ENCOUNTER — Ambulatory Visit (INDEPENDENT_AMBULATORY_CARE_PROVIDER_SITE_OTHER): Payer: BLUE CROSS/BLUE SHIELD | Admitting: Family

## 2018-03-23 ENCOUNTER — Encounter: Payer: Self-pay | Admitting: Family

## 2018-03-23 VITALS — BP 100/62 | HR 76 | Resp 16 | Ht 65.75 in | Wt 111.8 lb

## 2018-03-23 DIAGNOSIS — R278 Other lack of coordination: Secondary | ICD-10-CM | POA: Diagnosis not present

## 2018-03-23 DIAGNOSIS — Z79899 Other long term (current) drug therapy: Secondary | ICD-10-CM

## 2018-03-23 DIAGNOSIS — F902 Attention-deficit hyperactivity disorder, combined type: Secondary | ICD-10-CM | POA: Diagnosis not present

## 2018-03-23 DIAGNOSIS — Z719 Counseling, unspecified: Secondary | ICD-10-CM | POA: Diagnosis not present

## 2018-03-23 DIAGNOSIS — F411 Generalized anxiety disorder: Secondary | ICD-10-CM

## 2018-03-23 DIAGNOSIS — F819 Developmental disorder of scholastic skills, unspecified: Secondary | ICD-10-CM | POA: Diagnosis not present

## 2018-03-23 MED ORDER — BUSPIRONE HCL 30 MG PO TABS: 30.0000 mg | ORAL_TABLET | Freq: Two times a day (BID) | ORAL | 2 refills | Status: DC

## 2018-03-23 MED ORDER — SERTRALINE HCL 50 MG PO TABS: 50.0000 mg | ORAL_TABLET | Freq: Every day | ORAL | 2 refills | Status: DC

## 2018-03-23 MED ORDER — AMPHETAMINE SULFATE 10 MG PO TABS: 10.0000 mg | ORAL_TABLET | Freq: Every day | ORAL | 0 refills | Status: DC

## 2018-03-23 NOTE — Progress Notes (Signed)
[facility] [address] [place] [address] Dept: [phone] Dept Fax: [phone] Loc: [phone] Loc Fax: [phone]  Medical Follow-up  Patient ID: Michael [name], male  DOB: 31-Mar-1991, 27 y.o.  MRN: [id number]  Date of Evaluation: 03/23/2018  PCP: Georgann HousekeeperHusain, Karrar, MD  Accompanied by: Self Patient Lives with: parents  HISTORY/CURRENT STATUS:  HPI  Patient here for routine follow up related to ADHD, Anxiety, Dysgraphia, Learning problems, and medication management. Patient interactive and appropriate with provider. Taking a summer class on line and has signed up for fall classes already. To continue with Zoloft 50 mg daily, Buspar 30 BID, and Evekeo 10 mg 1/4 tablet with no side effects without panic attacks recently. No side effects reported.   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  Sleep Concerns: Initiation/Maintenance/Other: None  Individual Medical History/Review of System Changes? Yes, illness last semester and was seen by MD. Last tests done with no concerns.  Allergies: Patient has no known allergies.  Current Medications:  Current Outpatient Medications:  .  Amphetamine Sulfate (EVEKEO) 10 MG TABS, Take 10 mg by mouth daily., Disp: 30 tablet, Rfl: 0 .  busPIRone (BUSPAR) 30 MG tablet, Take 1 tablet (30 mg total) by mouth 2 (two) times daily., Disp: 60 tablet, Rfl: 2 .  sertraline (ZOLOFT) 50 MG tablet, Take 1 tablet (50 mg total) by mouth daily., Disp: 30  tablet, Rfl: 2 Medication Side Effects: Appetite Suppression  Family Medical/Social History Changes?: None recently.  MENTAL HEALTH: Mental Health Issues: Anxiety-Buspar and Zoloft  PHYSICAL EXAM: Vitals:  Today's Vitals   03/23/18 0907  BP: 100/62  Pulse: 76  Resp: 16  Weight: 111 lb 12.8 oz (50.7 kg)  Height: 5' 5.75" (1.67 m)  PainSc: 0-No pain  , Facility age limit for growth percentiles is 20 years.  General Exam: Physical Exam  Constitutional: He is oriented to person, place, and time. He appears well-developed and well-nourished.  HENT:  Head: Normocephalic and atraumatic.  Right Ear: External ear normal.  Left Ear: External ear normal.  Nose: Nose normal.  Mouth/Throat: Oropharynx is clear and moist.  Eyes: Pupils are equal, round, and reactive to light. Conjunctivae and EOM are normal.  Neck: Trachea normal, normal range of motion and full passive range of motion without pain. Neck supple.  Cardiovascular: Normal rate, regular rhythm, normal heart sounds and intact distal pulses.  Pulmonary/Chest: Effort normal and breath sounds normal.  Abdominal: Soft. Bowel sounds are normal.  Genitourinary:  Genitourinary Comments: Deferred  Musculoskeletal: Normal range of motion.  Neurological: He is alert and oriented to person, place, and time. He has normal reflexes.  Skin: Skin is warm, dry and intact. Capillary refill takes less than 2 seconds.  Psychiatric: He has a normal mood and affect. His behavior is normal. Judgment and thought content normal.  Vitals reviewed.  Review of Systems  Psychiatric/Behavioral: Positive for decreased concentration. The patient is nervous/anxious.   All other systems reviewed and are negative.  Patient with no concerns for toileting. Daily stool, no constipation or diarrhea. Void urine no difficulty. No enuresis.   Participate in daily oral hygiene to include brushing and flossing.  Neurological: oriented to time, place, and  person Cranial Nerves: normal  Neuromuscular:  Motor Mass: Normal  Tone: Normal  Strength: Normal  DTRs: 2+ and symmetric Overflow: None Reflexes: no tremors noted Sensory Exam: Vibratory: Intact  Fine Touch: Intact  Testing/Developmental Screens:  ASRS- 0/0 scored by patient and counseled at today's visit.  DIAGNOSES:    ICD-10-CM   1. ADHD (attention deficit hyperactivity disorder), combined type F90.2   2. Generalized anxiety disorder F41.1   3. Dysgraphia R27.8   4. Learning difficulty F81.9   5. Patient counseled Z71.9   6. Medication management Z79.899     RECOMMENDATIONS: 3 month follow up and continuation of medication. Patient counseled on medication management. To continue with Zoloft 50 mg daily, # 30 with 2 RF's, Buspar 30 mg 1 BID # 60 with 2 RF's, Evekeo 10 mg daily, # 30 with no refills. RX for above e-scribed and sent to pharmacy on record  [id number] [place], [place] - [address] AT [address] OF [facility] [address] [place] [address] Phone: [phone] Fax: [phone]  [id number] [address], [place] - [address] [place] [address] Phone: [phone] Fax: [phone]  Directed patient to f/u with PCP yearly, dentist as recommended, MVI daily, healthy eating habits, more exercise when able to get some in this summer and good sleep routine.  NEXT APPOINTMENT: Return in about 3 months (around 06/23/2018) for follow up visit.  More than 50% of the appointment was spent counseling and discussing diagnosis and management of symptoms with the patient and family.  Carron Curie, NP Counseling Time: 30 mins Total Contact Time: 40 mins

## 2018-04-27 ENCOUNTER — Other Ambulatory Visit: Payer: Self-pay | Admitting: Family

## 2018-04-27 NOTE — Telephone Encounter (Signed)
Pharmacy requested 90 days supply: Approved

## 2018-06-22 ENCOUNTER — Institutional Professional Consult (permissible substitution): Payer: BLUE CROSS/BLUE SHIELD | Admitting: Family

## 2018-07-01 ENCOUNTER — Other Ambulatory Visit: Payer: Self-pay

## 2018-07-01 MED ORDER — SERTRALINE HCL 50 MG PO TABS: 100.0000 mg | ORAL_TABLET | Freq: Every day | ORAL | 0 refills | Status: DC

## 2018-07-01 NOTE — Telephone Encounter (Signed)
RX for above e-scribed and sent to pharmacy on record  [id number] [place], [place] - [address] OF [address] [place] [address] Phone: [phone] Fax: [phone]    

## 2018-07-01 NOTE — Telephone Encounter (Signed)
Patient called in for refill for Zoloft. Last visit 03/23/2018 next visit 07/13/2018. [facility] [address]

## 2018-07-05 ENCOUNTER — Other Ambulatory Visit: Payer: Self-pay | Admitting: Family

## 2018-07-05 NOTE — Telephone Encounter (Signed)
Last visit 03/23/2018 next visit 07/13/2018

## 2018-07-13 ENCOUNTER — Encounter: Payer: Self-pay | Admitting: Family

## 2018-07-13 ENCOUNTER — Ambulatory Visit (INDEPENDENT_AMBULATORY_CARE_PROVIDER_SITE_OTHER): Payer: BLUE CROSS/BLUE SHIELD | Admitting: Family

## 2018-07-13 VITALS — BP 98/64 | HR 72 | Resp 18 | Ht 65.75 in | Wt 113.0 lb

## 2018-07-13 DIAGNOSIS — F902 Attention-deficit hyperactivity disorder, combined type: Secondary | ICD-10-CM | POA: Diagnosis not present

## 2018-07-13 DIAGNOSIS — R278 Other lack of coordination: Secondary | ICD-10-CM | POA: Diagnosis not present

## 2018-07-13 DIAGNOSIS — Z79899 Other long term (current) drug therapy: Secondary | ICD-10-CM

## 2018-07-13 DIAGNOSIS — F411 Generalized anxiety disorder: Secondary | ICD-10-CM | POA: Diagnosis not present

## 2018-07-13 DIAGNOSIS — Z719 Counseling, unspecified: Secondary | ICD-10-CM

## 2018-07-13 MED ORDER — SERTRALINE HCL 100 MG PO TABS: 100.0000 mg | ORAL_TABLET | Freq: Every day | ORAL | 2 refills | Status: DC

## 2018-07-13 MED ORDER — AMPHETAMINE SULFATE 10 MG PO TABS: 10.0000 mg | ORAL_TABLET | Freq: Every day | ORAL | 0 refills | Status: DC

## 2018-07-13 NOTE — Progress Notes (Signed)
[facility] [address] [place] [address] Dept: [phone] Dept Fax: [phone] Loc: [phone] Loc Fax: [phone]  Medical Follow-up  Patient ID: Michael [name], male  DOB: 03-12-1991, 27 y.o.  MRN: [id number]  Date of Evaluation: 07/13/2018  PCP: Georgann Housekeeper, MD  Accompanied by: Self Patient Lives with: parents  HISTORY/CURRENT STATUS:  HPI  Patient here for routine follow up related to ADHD, Dysgraphia, Anxiety, Learning problems, and medication management. Patient here by himself at today's visit. Patient interactive and appropriate. Doing well this semester and taking 4 classes without any assistance needed. Not as much anxiety this semester and controlling symptoms with medication regimen at this point. No side effects reported by patient.   EDUCATION: School: [employer]-4 classes (including lab) Biology & lab, [place], and Religion  Planning graduation in May Performance/Grades: average Services: Other: [employer] on campus Activities/Exercise: intermittently-walking on campus daily  MEDICAL HISTORY: Appetite: Ok, depending on what school offers.  MVI/Other: None Fruits/Vegs:some Calcium: some Iron:variety  Sleep: Most nights gets about 8-9 hours Sleep Concerns: Initiation/Maintenance/Other: None reported   Individual Medical History/Review of System Changes? Yes[facility]  Allergies: Patient has no known allergies.  Current Medications:  Current Outpatient Medications:  .  Amphetamine Sulfate (EVEKEO) 10 MG TABS, Take 10 mg by mouth daily., Disp: 30 tablet, Rfl: 0 .  busPIRone (BUSPAR) 30 MG tablet, TAKE 1 TABLET BY MOUTH TWICE A DAY, Disp: 60 tablet, Rfl: 2 .  sertraline (ZOLOFT) 100 MG tablet, Take 1 tablet (100 mg total) by mouth daily. 3 month supply, Disp: 90 tablet, Rfl: 2 Medication Side Effects:  None  Family Medical/Social History Changes?: None reported  MENTAL HEALTH: Mental Health Issues: Anxiety-less with medication and not getting counseling at this time.   PHYSICAL EXAM: Vitals:  Today's Vitals   07/13/18 1014  BP: 98/64  Pulse: 72  Resp: 18  Weight: 113 lb (51.3 kg)  Height: 5' 5.75" (1.67 m)  PainSc: 0-No pain  , Facility age limit for growth percentiles is 20 years.  General Exam: Physical Exam  Constitutional: He is oriented to person, place, and time. He appears well-developed and well-nourished.  HENT:  Head: Normocephalic and atraumatic.  Right Ear: External ear normal.  Left Ear: External ear normal.  Nose: Nose normal.  Mouth/Throat: Oropharynx is clear and moist.  Eyes: Pupils are equal, round, and reactive to light. Conjunctivae and EOM are normal.  Neck: Trachea normal, normal range of motion and full passive range of motion without pain. Neck supple.  Cardiovascular: Normal rate, regular rhythm, normal heart sounds and intact distal pulses.  Pulmonary/Chest: Effort normal and breath sounds normal.  Abdominal: Soft. Bowel sounds are normal.  Genitourinary:  Genitourinary Comments: Deferred  Musculoskeletal: Normal range of motion.  Neurological: He is alert and oriented to person, place, and time. He has normal reflexes.  Skin: Skin is warm, dry and intact. Capillary refill takes less than 2 seconds.  Psychiatric: He has a normal mood and affect. His behavior is normal. Judgment and thought content normal.  Vitals reviewed.  Review of Systems  Psychiatric/Behavioral: Positive for decreased concentration. The patient is nervous/anxious.   All other systems reviewed and are negative.  Patient with no concerns for toileting. Daily stool, no constipation or diarrhea. Void urine no difficulty. No enuresis.   Participate in daily oral hygiene to include brushing and flossing.  Neurological: oriented to time,  place, and person Cranial Nerves:  normal  Neuromuscular:  Motor Mass: Normal  Tone: Normal  Strength: Normal  DTRs: 2+ and symmetric Overflow: None Reflexes: no tremors noted Sensory Exam: Vibratory: Intact  Fine Touch: Intact  Testing/Developmental Screens:  ASRS-0/0 scored by patient and counseled at today's visit.   DIAGNOSES:    ICD-10-CM   1. ADHD (attention deficit hyperactivity disorder), combined type F90.2   2. Generalized anxiety disorder F41.1 sertraline (ZOLOFT) 100 MG tablet  3. Dysgraphia R27.8   4. Medication management Z79.899   5. Patient counseled Z71.9     RECOMMENDATIONS: 3 month follow up visit and continuation of medication. Patient to continue with medication regimen as previously. Zoloft 100 mg daily, # 90 with no refills, Buspar with no RF today, Evekeo 10 mg daily, # 30 with no refills.   RX for above e-scribed and sent to pharmacy on record RX for above e-scribed and sent to pharmacy on record  [id number] [name], [place] - [address] [place] [address] Phone: [phone] Fax: [phone]  Counseling at this visit included the review of old records and/or current chart with the patient with updates provided.   Discussed recent history and today's examination with patient with no changes on exam today.   Counseled regarding current school, academic success and social interactions.   Recommended a high protein, low sugar diet for ADHD patient, avoid sugary snacks and drinks, drink more water, eat more fruits and vegetables, increase daily exercise.  Encourage calorie dense foods when hungry. Encourage snacks in the afternoon/evening. Discussed increasing calories of foods with butter, sour cream, mayonnaise, cheese or ranch dressing. Can add potato flakes or powdered milk.   Discussed school academic and behavioral progress and advocated for appropriate accommodations as needed for continued support.   Maintain Structure, routine,  organization, reward, motivation and consequences of home and school environments.   Counseled medication administration, effects, and possible side effects of current regimen.    Advised importance of:  Good sleep hygiene (8- 10 hours per night) Limited screen time (none on school nights, no more than 2 hours on weekends) Regular exercise(outside and active play) Healthy eating (drink water, no sodas/sweet tea, limit portions and no seconds).   Directed patient to f/u with PCP yearly, dentist every 6 months, MVI daily, eye doctor as recommended, more healthy eating during the day with some exercise when able to and to continue with good sleep habits.   NEXT APPOINTMENT: Return in about 3 months (around 10/13/2018) for follow up visit.  More than 50% of the appointment was spent counseling and discussing diagnosis and management of symptoms with the patient and family.  Carron Curie, NP Counseling Time: 30 mins Total Contact Time: 40 mins

## 2018-08-02 ENCOUNTER — Other Ambulatory Visit: Payer: Self-pay | Admitting: Family

## 2018-08-02 NOTE — Telephone Encounter (Signed)
RX for above e-scribed and sent to pharmacy on record  [id number] [place], [place] - [address] [place] [address] Phone: [phone] Fax: [phone] 

## 2018-08-02 NOTE — Telephone Encounter (Signed)
Last visit 07/13/2018 next visit 10/15/2018

## 2018-09-30 ENCOUNTER — Other Ambulatory Visit: Payer: Self-pay | Admitting: Pediatrics

## 2018-09-30 NOTE — Telephone Encounter (Signed)
Last visit 07/13/2018 next visit 11/11/2018

## 2018-09-30 NOTE — Telephone Encounter (Signed)
E-Prescribed [facility]pirone directly to  [id number] [name]- [place], [place] - [address] [place] [address] Phone: [phone] Fax: [phone]

## 2018-10-15 ENCOUNTER — Encounter: Payer: Self-pay | Admitting: Family

## 2018-10-15 ENCOUNTER — Ambulatory Visit (INDEPENDENT_AMBULATORY_CARE_PROVIDER_SITE_OTHER): Payer: BLUE CROSS/BLUE SHIELD | Admitting: Family

## 2018-10-15 VITALS — BP 102/64 | HR 78 | Resp 16 | Ht 65.75 in | Wt 112.0 lb

## 2018-10-15 DIAGNOSIS — F411 Generalized anxiety disorder: Secondary | ICD-10-CM

## 2018-10-15 DIAGNOSIS — F902 Attention-deficit hyperactivity disorder, combined type: Secondary | ICD-10-CM

## 2018-10-15 DIAGNOSIS — Z719 Counseling, unspecified: Secondary | ICD-10-CM

## 2018-10-15 DIAGNOSIS — Z558 Other problems related to education and literacy: Secondary | ICD-10-CM

## 2018-10-15 DIAGNOSIS — Z79899 Other long term (current) drug therapy: Secondary | ICD-10-CM

## 2018-10-15 DIAGNOSIS — R278 Other lack of coordination: Secondary | ICD-10-CM

## 2018-10-15 MED ORDER — AMPHETAMINE SULFATE 10 MG PO TABS: 10.0000 mg | ORAL_TABLET | Freq: Every day | ORAL | 0 refills | Status: DC

## 2018-10-15 MED ORDER — SERTRALINE HCL 50 MG PO TABS: 50.0000 mg | ORAL_TABLET | Freq: Every day | ORAL | 0 refills | Status: DC

## 2018-10-15 MED ORDER — SERTRALINE HCL 100 MG PO TABS: 100.0000 mg | ORAL_TABLET | Freq: Every day | ORAL | 2 refills | Status: DC

## 2018-10-15 NOTE — Progress Notes (Signed)
[facility] [address] [place] [address] Dept: [phone] Dept Fax: [phone] Loc: [phone] Loc Fax: [phone]  Medical Follow-up  Patient ID: Michael [name], male  DOB: Oct 22, 1990, 28 y.o.  MRN: [id number]  Date of Evaluation: 10/15/2018  PCP: Georgann Housekeeper, MD  Accompanied by: Patient with parents at the visit today Patient Lives with: parents  HISTORY/CURRENT STATUS:  HPI  Patient here for routine follow up related to ADHD, Anxiety, History of OCD behaviors, Dysgraphia, and medication management. Patient here with parents for today's visit. Parents are concerned with his academic struggles over the past 2 semester related to his increased anxiety along with medication management to control his symptoms. Wanting documentation from [facility] to be sent to [facility] for medical documentation related to medication management and anxiety contributing to his academic difficulties the past 2 semesters.Patient at this time reports his medication regimen is assisting to control his anxiety and ADHD symptoms with no side effects reported.   EDUCATION: School: [employer]  Year/Grade: Senior year Homework Time: Increased time Performance/Grades: issues with grades with missing exams Services: IEP/504 Plan Activities/Exercise: walking on campus  MEDICAL HISTORY: Appetite: Good MVI/Other: None Fruits/Vegs:some Calcium: some Iron:variety  Sleep: Getting enough sleep now with  Sleep Concerns: Initiation/Maintenance/Other: None  Individual Medical History/Review of System Changes? Yes, increased issues with illness.   Allergies: Patient has no known allergies.  Current Medications:  Current Outpatient Medications:  .  Amphetamine Sulfate (EVEKEO) 10 MG TABS, Take 10 mg by mouth daily., Disp: 30 tablet, Rfl: 0 .  busPIRone (BUSPAR) 30 MG tablet,  TAKE 1 TABLET BY MOUTH TWICE A DAY, Disp: 60 tablet, Rfl: 1 .  sertraline (ZOLOFT) 100 MG tablet, Take 1 tablet (100 mg total) by mouth daily. 3 month supply, Disp: 90 tablet, Rfl: 2 .  sertraline (ZOLOFT) 50 MG tablet, Take 1 tablet (50 mg total) by mouth daily., Disp: 90 tablet, Rfl: 0 Medication Side Effects: None  Family Medical/Social History Changes?: None   MENTAL HEALTH: Mental Health Issues: Anxiety-Zoloft and Buspar to continue with symptom management. Denies suicidal thoughts or ideations.   PHYSICAL EXAM: Vitals:  Today's Vitals   10/15/18 1009  BP: 102/64  Pulse: 78  Resp: 16  Weight: 112 lb (50.8 kg)  Height: 5' 5.75" (1.67 m)  PainSc: 0-No pain  , Facility age limit for growth percentiles is 20 years.  General Exam: Physical Exam Vitals signs reviewed.  Constitutional:      Appearance: He is well-developed.  HENT:     Head: Normocephalic and atraumatic.     Right Ear: External ear normal.     Left Ear: External ear normal.     Nose: Nose normal.  Eyes:     Conjunctiva/sclera: Conjunctivae normal.     Pupils: Pupils are equal, round, and reactive to light.  Neck:     Musculoskeletal: Full passive range of motion without pain, normal range of motion and neck supple.     Trachea: Trachea normal.  Cardiovascular:     Rate and Rhythm: Normal rate and regular rhythm.     Heart sounds: Normal heart sounds.  Pulmonary:     Effort: Pulmonary effort is normal.     Breath sounds: Normal breath sounds.  Abdominal:     General: Bowel sounds are normal.     Palpations: Abdomen is soft.  Musculoskeletal: Normal range of motion.  Skin:    General:  Skin is warm and dry.     Capillary Refill: Capillary refill takes less than 2 seconds.  Neurological:     General: No focal deficit present.     Mental Status: He is alert and oriented to person, place, and time. Mental status is at baseline.     Deep Tendon Reflexes: Reflexes are normal and symmetric.  Psychiatric:         Mood and Affect: Mood normal.        Behavior: Behavior normal.        Thought Content: Thought content normal.        Judgment: Judgment normal.   Review of Systems  Psychiatric/Behavioral: Positive for decreased concentration. The patient is nervous/anxious.   All other systems reviewed and are negative.  No concerns for toileting. Daily stool, no constipation or diarrhea. Void urine no difficulty. No enuresis.   Participate in daily oral hygiene to include brushing and flossing.  Neurological: oriented to time, place, and personal Cranial Nerves: normal  Neuromuscular:  Motor Mass: Normal  Tone: Normal  Strength: Normal  DTRs: 2+ and symmetric Overflow: None Reflexes: no tremors noted Sensory Exam: Vibratory: Intact  Fine Touch: Intact  Testing/Developmental Screens:  Not completed  DIAGNOSES:    ICD-10-CM   1. ADHD (attention deficit hyperactivity disorder), combined type F90.2   2. Generalized anxiety disorder F41.1 sertraline (ZOLOFT) 100 MG tablet  3. Dysgraphia R27.8   4. Medication management Z79.899   5. Patient counseled Z71.9   6. Academic problem Z55.8     RECOMMENDATIONS: 3 month follow up and continuation of medication management. Patient to continue with Evekeo 10 mg daily, # 30 with no RF's. Zoloft 100 mg daily and 50 mg daily, # 90 each Rx with no RF's, and continue with Buspar 30 mg daily with no RF's today. RX for above e-scribed and sent to pharmacy on record  [id number] [place], [place] - [address] [place] [address] Phone: [phone] Fax: [phone]  Counseling at this visit included the review of old records and/or current chart with the patient & parents at the visit today. Updated related to school difficulties with academic struggles related to anxiety the past 2 semesters.   Discussed recent history and today's examination with patient with no changes today.   Counseled regarding college  courses, increased difficulty with organization and executive function, peer relations and anxiety affecting every aspect of life the past 2 semesters.  Encourage calorie dense foods when hungry. Encourage snacks in the afternoon/evening. Add calories to food being consumed like switching to whole milk products, using instant breakfast type powders, increasing calories of foods with butter, sour cream, mayonnaise, cheese or ranch dressing. Can add potato flakes or powdered milk.   Discussed school academic and behavioral progress and advocated for appropriate accommodations as needed for college with disability services.   Discussed importance of maintaining structure, routine, organization, reward, motivation and consequences with consistency at home and school Executive functioning skill need to be a focus related to school issues these past 2 semesters.   Counseled medication pharmacokinetics, options, dosage, administration, desired effects, and possible side effects.    Advised importance of:  Good sleep hygiene (8- 10 hours per night, no TV or video games for 1 hour before bedtime) Limited screen time (none on school nights, no more than 2 hours/day on weekends, use of screen time for motivation) Regular exercise(outside and active play) Healthy eating (drink water or milk, no sodas/sweet tea,  limit portions and no seconds).   NEXT APPOINTMENT: Return in about 3 months (around 01/14/2019) for follow up visit.  More than 50% of the appointment was spent counseling and discussing diagnosis and management of symptoms with the patient and family.  Carron Curieawn M Paretta-Leahey, NP Counseling Time: 30 mins Total Contact Time: 40 mins

## 2018-10-30 ENCOUNTER — Other Ambulatory Visit: Payer: Self-pay | Admitting: Pediatrics

## 2018-11-01 NOTE — Telephone Encounter (Signed)
Last visit 10/15/2018 next visit 01/14/2019

## 2018-11-01 NOTE — Telephone Encounter (Signed)
Authorized 90 day supply of buspirone E-Prescribed [facility]pirone directly to  [id number] [name], [place] - [address] [place] [address] Phone: [phone] Fax: [phone]

## 2018-11-18 ENCOUNTER — Encounter: Payer: Self-pay | Admitting: Endocrinology

## 2018-11-18 ENCOUNTER — Ambulatory Visit (INDEPENDENT_AMBULATORY_CARE_PROVIDER_SITE_OTHER): Payer: BLUE CROSS/BLUE SHIELD | Admitting: Endocrinology

## 2018-11-18 DIAGNOSIS — F640 Transsexualism: Secondary | ICD-10-CM

## 2018-11-18 DIAGNOSIS — Z789 Other specified health status: Secondary | ICD-10-CM

## 2018-11-18 MED ORDER — ESTRADIOL 0.5 MG PO TABS: 0.5000 mg | ORAL_TABLET | Freq: Every day | ORAL | 5 refills | Status: DC

## 2018-11-18 NOTE — Progress Notes (Signed)
Subjective:    Patient ID: Michael [name], male    DOB: 06-07-1991, 28 y.o.   MRN: [id number]  HPI Pt is referred by for Dr Donette Larry, for rx of transgender state (M to F).  Pt reports she first suspected the transgender state at age 78.  She had puberty at the age of54.  She has never had surgery or medication for this.  He sees a Veterinary surgeon at [facility].  She has no children.  She has no h/o DVT, dyslipidemia, liver disease, kidney disease, heart disease, CVA, diabetes, smoking, gallbladder disease, blood disorders, osteoporosis, or cancer.  He has slight cold intolerance throughout the body, and assoc anxiety.  Past Medical History:  Diagnosis Date  . ADHD (attention deficit hyperactivity disorder)     No past surgical history on file.  Social History   Socioeconomic History  . Marital status: Single    Spouse name: Not on file  . Number of children: Not on file  . Years of education: Not on file  . Highest education level: Not on file  Occupational History  . Not on file  Social Needs  . Financial resource strain: Not on file  . Food insecurity:    Worry: Not on file    Inability: Not on file  . Transportation needs:    Medical: Not on file    Non-medical: Not on file  Tobacco Use  . Smoking status: Never Smoker  . Smokeless tobacco: Never Used  Substance and Sexual Activity  . Alcohol use: Not on file  . Drug use: Not on file  . Sexual activity: Not on file  Lifestyle  . Physical activity:    Days per week: Not on file    Minutes per session: Not on file  . Stress: Not on file  Relationships  . Social connections:    Talks on phone: Not on file    Gets together: Not on file    Attends religious service: Not on file    Active member of club or organization: Not on file    Attends meetings of clubs or organizations: Not on file    Relationship status: Not on file  . Intimate partner violence:    Fear of current or ex partner: Not on file    Emotionally abused:  Not on file    Physically abused: Not on file    Forced sexual activity: Not on file  Other Topics Concern  . Not on file  Social History Narrative  . Not on file    Current Outpatient Medications on File Prior to Visit  Medication Sig Dispense Refill  . Amphetamine Sulfate (EVEKEO) 10 MG TABS Take 10 mg by mouth daily. 30 tablet 0  . busPIRone (BUSPAR) 30 MG tablet TAKE 1 TABLET BY MOUTH TWICE A DAY 180 tablet 0  . sertraline (ZOLOFT) 100 MG tablet Take 1 tablet (100 mg total) by mouth daily. 3 month supply 90 tablet 2   No current facility-administered medications on file prior to visit.     No Known Allergies  Family History  Problem Relation Age of Onset  . Asthma Mother   . Depression Mother   . Learning disabilities Mother   . ADD / ADHD Father   . Other Father        low testosterone  . Speech disorder Brother     BP 128/72 (BP Location: Right Arm, Patient Position: Sitting, Cuff Size: Normal)   Pulse 87   Ht 5'  7.5" (1.715 m)   Wt 114 lb 12.8 oz (52.1 kg)   SpO2 97%   BMI 17.71 kg/m    Review of Systems Denies weight change, headache, visual loss, chest pain, sob, nausea, hematuria, acne, easy bruising, seizure, edema, myalgias, rhinorrhea, and excessive diaphoresis.     Objective:   Physical Exam VS: see vs page GEN: no distress HEAD: head: no deformity eyes: no periorbital swelling, no proptosis external nose and ears are normal mouth: no lesion seen NECK: supple, thyroid is not enlarged CHEST WALL: no deformity LUNGS: clear to auscultation BREASTS:  No gynecomastia CV: reg rate and rhythm, no murmur ABD: abdomen is soft, nontender.  no hepatosplenomegaly.  not distended.  no hernia GENITALIA:  Normal male.   MUSCULOSKELETAL: muscle bulk and strength are grossly normal.  no obvious joint swelling.  gait is normal and steady EXTEMITIES: no deformity.  no ulcer on the feet.  feet are of normal color and temp.  no edema PULSES: dorsalis pedis intact  bilat.  no carotid bruit NEURO:  cn 2-12 grossly intact.   readily moves all 4's.  sensation is intact to touch on the feet SKIN:  Normal texture and temperature.  No rash or suspicious lesion is visible.  Normal male terminal hair distribution. NODES:  None palpable at the neck PSYCH: alert, well-oriented.  Does not appear anxious nor depressed.  outside test results are reviewed: TSH=1.45 TG=55 HDL=54 LDL=55      Assessment & Plan:  Transgender state, new to me.   Patient Instructions  Please ask that a copy of your counselor's note be sent to [place].   I have sent a prescription to your pharmacy, for the estrogen pill.  Please come back for a follow-up appointment in 1 month.   '

## 2018-11-18 NOTE — Patient Instructions (Addendum)
Please ask that a copy of your counselor's note be sent to [place].   I have sent a prescription to your pharmacy, for the estrogen pill.  Please come back for a follow-up appointment in 1 month.

## 2018-11-21 DIAGNOSIS — F640 Transsexualism: Secondary | ICD-10-CM | POA: Insufficient documentation

## 2018-11-21 DIAGNOSIS — Z789 Other specified health status: Secondary | ICD-10-CM | POA: Insufficient documentation

## 2018-11-26 ENCOUNTER — Telehealth: Payer: Self-pay

## 2018-12-01 ENCOUNTER — Telehealth: Payer: Self-pay | Admitting: Endocrinology

## 2018-12-01 NOTE — Telephone Encounter (Signed)
Melody Haver Bazquez "consuleor" called to advise that she has received the release form to allow collaboration between her and patients physician.  best time to reach is 830am on Wed-Thur-Fri

## 2018-12-01 NOTE — Telephone Encounter (Signed)
Thanks.  All we need is last ov note.

## 2018-12-01 NOTE — Telephone Encounter (Signed)
Called Michael [name] to make her aware. States there is not an office note to provide. Can provide a letter with a summarization of services and that gender dysphoria is a real condition. States more specifics will need to be provided regarding what you need and why you are requesting it. If you would like to speak with her directly, phone # is [phone]

## 2018-12-01 NOTE — Telephone Encounter (Signed)
Please advise 

## 2018-12-01 NOTE — Telephone Encounter (Signed)
Ok, the letter is fine.  It can be as brief as you want.

## 2018-12-02 NOTE — Telephone Encounter (Signed)
Called Michael [name] to make her aware of Dr. George Hugh need. LVM requesting returned call.

## 2018-12-02 NOTE — Telephone Encounter (Signed)
Just diagnosis and plan

## 2018-12-02 NOTE — Telephone Encounter (Signed)
And the purpose of the letter?

## 2018-12-03 NOTE — Telephone Encounter (Signed)
Second call has been placed to request this information.

## 2018-12-06 NOTE — Telephone Encounter (Signed)
Third attempt to reach out to [place]. Informed staff member that Michael [name] has not returned our call. States Michael [name] was given the message and will not return to the office until Wednesday (12/08/18)

## 2018-12-21 ENCOUNTER — Other Ambulatory Visit: Payer: Self-pay

## 2018-12-21 NOTE — Telephone Encounter (Signed)
Patient called in for refill for Evekeo. Last visit 10/15/2018 next visit 01/14/2019. [facility] [address] [facility]

## 2018-12-22 MED ORDER — AMPHETAMINE SULFATE 10 MG PO TABS: 10.0000 mg | ORAL_TABLET | Freq: Every day | ORAL | 0 refills | Status: DC

## 2018-12-22 NOTE — Telephone Encounter (Signed)
RX for above e-scribed and sent to pharmacy on record  [id number] [place], [place] - [address]  [place] [address] Phone: [phone] Fax: [phone] 

## 2018-12-24 ENCOUNTER — Ambulatory Visit (INDEPENDENT_AMBULATORY_CARE_PROVIDER_SITE_OTHER): Payer: BLUE CROSS/BLUE SHIELD | Admitting: Endocrinology

## 2018-12-24 ENCOUNTER — Other Ambulatory Visit: Payer: Self-pay

## 2018-12-24 VITALS — BP 104/80 | HR 78 | Ht 67.5 in | Wt 112.4 lb

## 2018-12-24 DIAGNOSIS — Z789 Other specified health status: Secondary | ICD-10-CM

## 2018-12-24 DIAGNOSIS — F640 Transsexualism: Secondary | ICD-10-CM | POA: Diagnosis not present

## 2018-12-24 NOTE — Patient Instructions (Addendum)
The next step is for the two of you to go to the counselor together.   Please ask that a copy of your counselor's note be sent to [place].

## 2018-12-24 NOTE — Progress Notes (Signed)
Subjective:    Patient ID: Michael [name], male    DOB: 16-Jan-1991, 28 y.o.   MRN: [id number]  HPI  Pt returns for f/u of transgender state (M to F) (she first suspected the transgender state at age 24; she had puberty at the age of 77 (she has never had surgery for this; she sees a Veterinary surgeon at [facility]; she has no children.  She has no risk factors for E2 rx, and was rx'ed E2 in early 2020).  Since on the estrace, pt states slight pain at the breast areas, but no assoc swelling.  Pt's father is with patient here today.  He wishes to question the transgender dx.  He feels it might be due to preexisting hypogonadism.  He disagrees with the transgender dx.   Past Medical History:  Diagnosis Date  . ADHD (attention deficit hyperactivity disorder)     No past surgical history on file.  Social History   Socioeconomic History  . Marital status: Single    Spouse name: Not on file  . Number of children: Not on file  . Years of education: Not on file  . Highest education level: Not on file  Occupational History  . Not on file  Social Needs  . Financial resource strain: Not on file  . Food insecurity:    Worry: Not on file    Inability: Not on file  . Transportation needs:    Medical: Not on file    Non-medical: Not on file  Tobacco Use  . Smoking status: Never Smoker  . Smokeless tobacco: Never Used  Substance and Sexual Activity  . Alcohol use: Not on file  . Drug use: Not on file  . Sexual activity: Not on file  Lifestyle  . Physical activity:    Days per week: Not on file    Minutes per session: Not on file  . Stress: Not on file  Relationships  . Social connections:    Talks on phone: Not on file    Gets together: Not on file    Attends religious service: Not on file    Active member of club or organization: Not on file    Attends meetings of clubs or organizations: Not on file    Relationship status: Not on file  . Intimate partner violence:    Fear of current or  ex partner: Not on file    Emotionally abused: Not on file    Physically abused: Not on file    Forced sexual activity: Not on file  Other Topics Concern  . Not on file  Social History Narrative  . Not on file    Current Outpatient Medications on File Prior to Visit  Medication Sig Dispense Refill  . Amphetamine Sulfate (EVEKEO) 10 MG TABS Take 10 mg by mouth daily. 30 tablet 0  . busPIRone (BUSPAR) 30 MG tablet TAKE 1 TABLET BY MOUTH TWICE A DAY 180 tablet 0  . estradiol (ESTRACE) 0.5 MG tablet Take 1 tablet (0.5 mg total) by mouth daily. 30 tablet 5  . sertraline (ZOLOFT) 100 MG tablet Take 1 tablet (100 mg total) by mouth daily. 3 month supply 90 tablet 2   No current facility-administered medications on file prior to visit.     No Known Allergies  Family History  Problem Relation Age of Onset  . Asthma Mother   . Depression Mother   . Learning disabilities Mother   . ADD / ADHD Father   . Other  Father        low testosterone  . Speech disorder Brother     BP 104/80 (BP Location: Right Arm, Patient Position: Sitting, Cuff Size: Normal)   Pulse 78   Ht 5' 7.5" (1.715 m)   Wt 112 lb 6.4 oz (51 kg)   SpO2 97%   BMI 17.34 kg/m   Review of Systems Denies sob    Objective:   Physical Exam VITAL SIGNS:  See vs page GENERAL: no distress Ext: no leg edema.   outside test results are reviewed:  E2=21     Assessment & Plan:  Transgender state.  We discussed.  Father is challenging dx.  I told him low testosterone is not the cause of this.  Please continue the same medication for now.    Patient Instructions  The next step is for the two of you to go to the counselor together.   Please ask that a copy of your counselor's note be sent to [place].

## 2019-01-14 ENCOUNTER — Ambulatory Visit (INDEPENDENT_AMBULATORY_CARE_PROVIDER_SITE_OTHER): Payer: BLUE CROSS/BLUE SHIELD | Admitting: Family

## 2019-01-14 ENCOUNTER — Encounter: Payer: Self-pay | Admitting: Family

## 2019-01-14 ENCOUNTER — Other Ambulatory Visit: Payer: Self-pay

## 2019-01-14 DIAGNOSIS — Z558 Other problems related to education and literacy: Secondary | ICD-10-CM

## 2019-01-14 DIAGNOSIS — Z789 Other specified health status: Secondary | ICD-10-CM

## 2019-01-14 DIAGNOSIS — R278 Other lack of coordination: Secondary | ICD-10-CM | POA: Diagnosis not present

## 2019-01-14 DIAGNOSIS — F411 Generalized anxiety disorder: Secondary | ICD-10-CM | POA: Diagnosis not present

## 2019-01-14 DIAGNOSIS — Z719 Counseling, unspecified: Secondary | ICD-10-CM

## 2019-01-14 DIAGNOSIS — F640 Transsexualism: Secondary | ICD-10-CM | POA: Diagnosis not present

## 2019-01-14 DIAGNOSIS — Z7189 Other specified counseling: Secondary | ICD-10-CM

## 2019-01-14 DIAGNOSIS — F902 Attention-deficit hyperactivity disorder, combined type: Secondary | ICD-10-CM

## 2019-01-14 DIAGNOSIS — Z79899 Other long term (current) drug therapy: Secondary | ICD-10-CM

## 2019-01-14 MED ORDER — SERTRALINE HCL 100 MG PO TABS: 100.0000 mg | ORAL_TABLET | Freq: Every day | ORAL | 2 refills | Status: DC

## 2019-01-14 MED ORDER — AMPHETAMINE SULFATE 10 MG PO TABS: 10.0000 mg | ORAL_TABLET | Freq: Every day | ORAL | 0 refills | Status: DC

## 2019-01-14 NOTE — Progress Notes (Signed)
Patient ID: GIROLAMO VEKSLER, male   DOB: 06/10/1991, 28 y.o.   MRN: [id number]  [facility] [address], [place]. [address] [place] [address] Dept: [phone] Dept Fax: [phone]  Medication Check visit via Virtual Video due to COVID-19  Patient ID:  Tykel Wyne  male DOB: 01-09-1991   27 y.o.   MRN: [id number]   DATE:01/14/19  PCP: Georgann Housekeeper, MD  Virtual Visit via Video Note  I connected [phone] 's Patient (Name Rhys Dejongh) on 01/14/19 at 11:00 AM EDT by a video enabled telemedicine application and verified that I am speaking with the correct person using two identifiers.   I discussed the limitations of evaluation and management by telemedicine and the availability of in person appointments. The patient expressed understanding and agreed to proceed.  Parent Location: at home Provider Locations: [address] [place], [place]  HISTORY/CURRENT STATUS: HEATHER ZERON is here for medication management of the psychoactive medications for ADHD and review of educational and behavioral concerns.   Arath currently taking Concerta and Zoloft, which is working well. Takes medication at 9-10:00 am. Medication tends to wear off after about 3-4 hours. Knox is able to focus through homework.   Mollie is eating well (eating breakfast, lunch and dinner). No changes with eating habits.   Sleeping well (getting enough sleep with no reported issues, about 8-10 most nights), sleeping through the night.   Melanie denies thoughts of hurting self or others, denies depression, anxiety, or fears. Anxiety is up with changes to school schedule and increased stressors.  EDUCATION: School: [employer]  Year/Grade: college  Performance/ Grades: average Services: IEP/504 Plan and Other: Disability Services Taking a few hours each day depending on the ability to focus for only short  periods of time.  Malikiah is currently out of school due to social distancing due to COVID-19 and online classes now.   Activities/ Exercise: intermittently-not getting as much as he would like to encounter.  Screen time: (phone, tablet, TV, computer): Online classes now and phone/TV when allotted for time.   MEDICAL HISTORY: Individual Medical History/ Review of Systems: Changes?  Estrogen 0.5 mg daily, started last and prescribed by Everardo All, Endocrinology. Have to follow up in the next the next few months. Blood work to be completed next visit.   Family Medical/ Social History: Changes? None reported recently by patient.   Current Medications:  Outpatient Encounter Medications as of 01/14/2019  Medication Sig  . Amphetamine Sulfate (EVEKEO) 10 MG TABS Take 10 mg by mouth daily.  . busPIRone (BUSPAR) 30 MG tablet TAKE 1 TABLET BY MOUTH TWICE A DAY  . estradiol (ESTRACE) 0.5 MG tablet Take 1 tablet (0.5 mg total) by mouth daily.  . sertraline (ZOLOFT) 100 MG tablet Take 1 tablet (100 mg total) by mouth daily. 3 month supply  . [DISCONTINUED] Amphetamine Sulfate (EVEKEO) 10 MG TABS Take 10 mg by mouth daily.  . [DISCONTINUED] sertraline (ZOLOFT) 100 MG tablet Take 1 tablet (100 mg total) by mouth daily. 3 month supply   No facility-administered encounter medications on file as of 01/14/2019.    Medication Side Effects: None  MENTAL HEALTH: Mental Health Issues:   Anxiety-Zoloft 100 mg daily with control of symptoms. Video conference with counselor, Maisie Fus at [facility], in the next few weeks. Discussed recent start of male to male transitioning with father having a difficult time with acceptance.   DIAGNOSES:    ICD-10-CM   1. ADHD (attention  deficit hyperactivity disorder), combined type F90.2 Amphetamine Sulfate (EVEKEO) 10 MG TABS  2. Generalized anxiety disorder F41.1 sertraline (ZOLOFT) 100 MG tablet  3. Dysgraphia R27.8   4. Male-to-male transgender person F106.0   5. Academic  problem Z55.8   6. Medication management Z79.899   7. Patient counseled Z71.9   8. Goals of care, counseling/discussion Z71.89     RECOMMENDATIONS:  Discussed recent history and updates with patient since last f/u visit. Some changes with gender identity that he is seeking counseling.   Discussed school academic progress and appropriate accommodations as needed for learning success. Not heard from academic services related to grade replacements or dropping the classes.   Discussed continued need for routine, structure, motivation, reward and positive reinforcement for new scheduled due to learning needs.   Encouraged recommended limitations on TV, tablets, phones, video games and computers for non-educational activities.   Encouraged physical activity and outdoor play, maintaining social distancing.   Discussed how to talk to anxious people about coronavirus.   Counseled medication pharmacokinetics, options, dosage, administration, desired effects, and possible side effects.   Zoloft 100 mg daily, # 90 with no RF's and Evekeo 10 mg daily, # 30 with no RF's. RX for above e-scribed and sent to pharmacy on record  [id number] [name], [place] - [address] [place] [address] Phone: [phone] Fax: [phone]  I discussed the assessment and treatment plan with the patient. The patient was provided an opportunity to ask questions and all were answered. The patient agreed with the plan and demonstrated an understanding of the instructions.   I provided 25 minutes of non-face-to-face time during this encounter. Record review of 5 minutes prior to video visit.   NEXT APPOINTMENT:  Return in about 3 months (around 04/15/2019) for follow up visit.  The patient/parent was advised to call back or seek an in-person evaluation if the symptoms worsen or if the condition fails to improve as anticipated.  Medical Decision-making: More than 50% of the  appointment was spent counseling and discussing diagnosis and management of symptoms with the patient and family.  Carron Curie, NP

## 2019-01-28 ENCOUNTER — Other Ambulatory Visit: Payer: Self-pay | Admitting: Pediatrics

## 2019-01-28 NOTE — Telephone Encounter (Signed)
Buspar 30 mg BID, # 180 with no RF's (3 month supply) RX for above e-scribed and sent to pharmacy on record  [id number] [name], [place] - [address] [place] [address] Phone: [phone] Fax: [phone]

## 2019-02-06 ENCOUNTER — Encounter: Payer: Self-pay | Admitting: Family

## 2019-02-06 ENCOUNTER — Encounter: Payer: Self-pay | Admitting: Pediatrics

## 2019-02-16 ENCOUNTER — Encounter: Payer: Self-pay | Admitting: Endocrinology

## 2019-02-16 ENCOUNTER — Ambulatory Visit (INDEPENDENT_AMBULATORY_CARE_PROVIDER_SITE_OTHER): Payer: Self-pay | Admitting: Endocrinology

## 2019-02-16 DIAGNOSIS — Z789 Other specified health status: Secondary | ICD-10-CM

## 2019-02-16 DIAGNOSIS — F640 Transsexualism: Secondary | ICD-10-CM

## 2019-02-16 MED ORDER — ESTRADIOL 1 MG PO TABS: 1.0000 mg | ORAL_TABLET | Freq: Every day | ORAL | 3 refills | Status: DC

## 2019-02-16 NOTE — Progress Notes (Signed)
Subjective:    Patient ID: Michael [name], male    DOB: 1991-07-22, 28 y.o.   MRN: [id number]  HPI  telehealth visit today via phone x 8 minutes Alternatives to telehealth are presented to this patient, and the patient agrees to the telehealth visit. Pt is advised of the cost of the visit, and agrees to this, also.   Patient is at home, and I am at the office.   Persons attending the telehealth visit: the patient and I Pt returns for f/u of transgender state (M to F) (she first suspected the transgender state at age 13; she had puberty at the age of 11; she has never had transgendersurgery; she sees a Veterinary surgeon at [facility]; she has no children.  She has no risk factors for E2 rx, and was rx'ed E2 in early 2020).  Pt's father has questioned transgender dx). Since on the estradiol, she reports emotional improvement.  No breast development yet.  Pt says he saw counselor with his father, and this went well.   Past Medical History:  Diagnosis Date  . ADHD (attention deficit hyperactivity disorder)     No past surgical history on file.  Social History   Socioeconomic History  . Marital status: Single    Spouse name: Not on file  . Number of children: Not on file  . Years of education: Not on file  . Highest education level: Not on file  Occupational History  . Not on file  Social Needs  . Financial resource strain: Not on file  . Food insecurity:    Worry: Not on file    Inability: Not on file  . Transportation needs:    Medical: Not on file    Non-medical: Not on file  Tobacco Use  . Smoking status: Never Smoker  . Smokeless tobacco: Never Used  Substance and Sexual Activity  . Alcohol use: Not on file  . Drug use: Not on file  . Sexual activity: Not on file  Lifestyle  . Physical activity:    Days per week: Not on file    Minutes per session: Not on file  . Stress: Not on file  Relationships  . Social connections:    Talks on phone: Not on file    Gets together:  Not on file    Attends religious service: Not on file    Active member of club or organization: Not on file    Attends meetings of clubs or organizations: Not on file    Relationship status: Not on file  . Intimate partner violence:    Fear of current or ex partner: Not on file    Emotionally abused: Not on file    Physically abused: Not on file    Forced sexual activity: Not on file  Other Topics Concern  . Not on file  Social History Narrative  . Not on file    Current Outpatient Medications on File Prior to Visit  Medication Sig Dispense Refill  . Amphetamine Sulfate (EVEKEO) 10 MG TABS Take 10 mg by mouth daily. 30 tablet 0  . busPIRone (BUSPAR) 30 MG tablet TAKE 1 TABLET BY MOUTH TWICE A DAY 180 tablet 0  . sertraline (ZOLOFT) 100 MG tablet Take 1 tablet (100 mg total) by mouth daily. 3 month supply 90 tablet 2   No current facility-administered medications on file prior to visit.     No Known Allergies  Family History  Problem Relation Age of Onset  . Asthma  Mother   . Depression Mother   . Learning disabilities Mother   . ADD / ADHD Father   . Other Father        low testosterone  . Speech disorder Brother      Review of Systems Denies leg swelling and breast pain.      Objective:   Physical Exam      Assessment & Plan:  Transgender state: she needs increased rx.    Patient Instructions  I have sent a prescription to your pharmacy, to double the estrogen pill. Please redo the blood test in 2 weeks. Please come back for a follow-up appointment in 6 months.

## 2019-02-16 NOTE — Patient Instructions (Signed)
I have sent a prescription to your pharmacy, to double the estrogen pill. Please redo the blood test in 2 weeks. Please come back for a follow-up appointment in 6 months.

## 2019-03-04 ENCOUNTER — Other Ambulatory Visit (INDEPENDENT_AMBULATORY_CARE_PROVIDER_SITE_OTHER): Payer: Self-pay

## 2019-03-04 ENCOUNTER — Other Ambulatory Visit: Payer: Self-pay

## 2019-03-04 DIAGNOSIS — F640 Transsexualism: Secondary | ICD-10-CM

## 2019-03-04 DIAGNOSIS — Z789 Other specified health status: Secondary | ICD-10-CM

## 2019-03-11 ENCOUNTER — Other Ambulatory Visit: Payer: Self-pay

## 2019-03-11 DIAGNOSIS — F902 Attention-deficit hyperactivity disorder, combined type: Secondary | ICD-10-CM

## 2019-03-11 LAB — ESTRADIOL, FREE
Estradiol, Free: 0.71 pg/mL — ABNORMAL HIGH
Estradiol: 35 pg/mL — ABNORMAL HIGH

## 2019-03-11 MED ORDER — AMPHETAMINE SULFATE 10 MG PO TABS: 10.0000 mg | ORAL_TABLET | Freq: Every day | ORAL | 0 refills | Status: DC

## 2019-03-11 NOTE — Telephone Encounter (Signed)
Evekeo 10 mg daily, # 30 with no Rf's RX for above e-scribed and sent to pharmacy on record  [id number] [name], [place] - [address] [place] [address] Phone: [phone] Fax: [phone]

## 2019-03-11 NOTE — Telephone Encounter (Signed)
Patient called in for refill for Evekeo. Last visit 01/14/2019 next visit 04/06/2019. [facility] [address] [place]

## 2019-04-06 ENCOUNTER — Other Ambulatory Visit: Payer: Self-pay

## 2019-04-06 ENCOUNTER — Encounter: Payer: Self-pay | Admitting: Family

## 2019-04-06 ENCOUNTER — Ambulatory Visit (INDEPENDENT_AMBULATORY_CARE_PROVIDER_SITE_OTHER): Payer: BC Managed Care – PPO | Admitting: Family

## 2019-04-06 DIAGNOSIS — F640 Transsexualism: Secondary | ICD-10-CM | POA: Diagnosis not present

## 2019-04-06 DIAGNOSIS — Z79899 Other long term (current) drug therapy: Secondary | ICD-10-CM

## 2019-04-06 DIAGNOSIS — Z789 Other specified health status: Secondary | ICD-10-CM

## 2019-04-06 DIAGNOSIS — Z719 Counseling, unspecified: Secondary | ICD-10-CM

## 2019-04-06 DIAGNOSIS — F902 Attention-deficit hyperactivity disorder, combined type: Secondary | ICD-10-CM

## 2019-04-06 DIAGNOSIS — R278 Other lack of coordination: Secondary | ICD-10-CM

## 2019-04-06 DIAGNOSIS — F411 Generalized anxiety disorder: Secondary | ICD-10-CM

## 2019-04-06 DIAGNOSIS — F819 Developmental disorder of scholastic skills, unspecified: Secondary | ICD-10-CM

## 2019-04-06 MED ORDER — BUSPIRONE HCL 30 MG PO TABS: 30.0000 mg | ORAL_TABLET | Freq: Two times a day (BID) | ORAL | 0 refills | Status: DC

## 2019-04-06 MED ORDER — SERTRALINE HCL 100 MG PO TABS: 100.0000 mg | ORAL_TABLET | Freq: Every day | ORAL | 2 refills | Status: DC

## 2019-04-06 MED ORDER — AMPHETAMINE SULFATE 10 MG PO TABS: 10.0000 mg | ORAL_TABLET | Freq: Every day | ORAL | 0 refills | Status: DC

## 2019-04-06 NOTE — Progress Notes (Signed)
[facility] [place]. [address] [place] [address] Dept: [phone] Dept Fax: [phone]  Medication Check visit via Virtual Video due to COVID-19  Patient ID:  Michael [name]  male DOB: 11-04-1990   27 y.o.   MRN: [id number]   DATE:04/06/19  PCP: Wenda Low, MD  Virtual Visit via Video Note  I connected with  Michael [name] on 04/06/19 at  2:00 PM EDT by a video enabled telemedicine application and verified that I am speaking with the correct person using two identifiers. Patient Location: at home   I discussed the limitations, risks, security and privacy concerns of performing an evaluation and management service by telephone and the availability of in person appointments. I also discussed with the parents that there may be a patient responsible charge related to this service. The parents expressed understanding and agreed to proceed.  Provider: Carolann Littler, NP  Location: private residence  HISTORY/CURRENT STATUS: Michael [name] is here for medication management of the psychoactive medications for ADHD and review of educational and behavioral concerns.   Michael [name] currently taking Irine Seal, Zoloft and Buspar, which is working well. Takes medication as directed. Medication tends to last during the day. Michael [name] is able to focus through school/homework.   Michael [name] is eating well (eating breakfast, lunch and dinner).   Sleeping well (getting enough sleep each night), sleeping through the night.   EDUCATION: School: [employer] and applying to graduate school  Year/Grade: college and will graduate next semester Performance/ Grades: average Services: IEP/504 Plan and Other: Disability Services 3 summer classes, Biology, lab, and [employer].  Michael [name] was out of school due to social distancing due to COVID-19 and participated in a home schooling program.    Activities/ Exercise: daily-outside hormones  Screen time: (phone, tablet, TV, computer): TV, computer, and phone.   MEDICAL HISTORY: Individual Medical History/ Review of Systems: Changes? :None recently. Endocrinology for hormones last month with continuation of Estradiol 0.5 mg to recent increase of 1 mg. 6 week follow up and continuation of monitoring by doctor.   Family Medical/ Social History: Changes? No Patient Lives with: parents  Current Medications:  Current Outpatient Medications on File Prior to Visit  Medication Sig Dispense Refill  . estradiol (ESTRACE) 1 MG tablet Take 1 tablet (1 mg total) by mouth daily. 90 tablet 3   No current facility-administered medications on file prior to visit.    Medication Side Effects: None  MENTAL HEALTH: Mental Health Issues: Anxiety-Zoloft  Therapist bi-weekly and assisting with transitioning along with learning about patient.   DIAGNOSES:    ICD-10-CM   1. ADHD (attention deficit hyperactivity disorder), combined type  F90.2 Amphetamine Sulfate (EVEKEO) 10 MG TABS  2. Generalized anxiety disorder  F41.1 sertraline (ZOLOFT) 100 MG tablet  3. Dysgraphia  R27.8   4. Male-to-male transgender person  F64.0   5. Learning difficulty  F81.9   6. Patient counseled  Z71.9   7. Medication management  Z79.899     RECOMMENDATIONS:  Discussed recent history with patient with updates since last f/u visit with learning, schools and home environments.   Discussed school academic progress and recommended continued summer academic home school activities using appropriate accommodations as needed for learning.   Discussed continued need for routine, structure, and motivation needed for school and social interactions.   Encouraged recommended limitations on TV, tablets, phones, video games and computers for non-educational activities.   Discussed need for bedtime routine, use of  good sleep hygiene, no video games, TV or phones for an hour  before bedtime.   Encouraged physical activity and outdoor play, maintaining social distancing.   Counseled medication pharmacokinetics, options, dosage, administration, desired effects, and possible side effects.   Buspar 30 mg BID, # 180 with no RF"s, Evekeo 10 mg daily, # 30 with no RF's, and Zoloft 100 mg daily, # 90 with 2 RF's. RX for above e-scribed and sent to pharmacy on record  [id number] [name], [place] - [address] [place] [address] Phone: [phone] Fax: [phone]  I discussed the assessment and treatment plan with the patient. The patient was provided an opportunity to ask questions and all were answered. The patient agreed with the plan and demonstrated an understanding of the instructions.   I provided 25 minutes of non-face-to-face time during this encounter. Completed record review for 10 minutes prior to the virtual video visit.   NEXT APPOINTMENT:  Return in about 3 months (around 07/07/2019) for follow up visit.  The patient was advised to call back or seek an in-person evaluation if the symptoms worsen or if the condition fails to improve as anticipated.  Medical Decision-making: More than 50% of the appointment was spent counseling and discussing diagnosis and management of symptoms with the patient and family.  Carron Curieawn M Paretta-Leahey, NP

## 2019-07-05 ENCOUNTER — Other Ambulatory Visit: Payer: Self-pay | Admitting: Family

## 2019-07-05 DIAGNOSIS — F411 Generalized anxiety disorder: Secondary | ICD-10-CM

## 2019-07-05 NOTE — Telephone Encounter (Signed)
RX for above e-scribed and sent to pharmacy on record  [id number] - [place], [place] - [address] [place]  [address] Phone: [phone] Fax: [phone] 

## 2019-07-05 NOTE — Telephone Encounter (Signed)
Last visit 04/06/2019 next visit 07/15/2019

## 2019-07-15 ENCOUNTER — Encounter: Payer: Self-pay | Admitting: Family

## 2019-07-15 ENCOUNTER — Ambulatory Visit (INDEPENDENT_AMBULATORY_CARE_PROVIDER_SITE_OTHER): Payer: BC Managed Care – PPO | Admitting: Family

## 2019-07-15 ENCOUNTER — Other Ambulatory Visit: Payer: Self-pay

## 2019-07-15 DIAGNOSIS — Z719 Counseling, unspecified: Secondary | ICD-10-CM

## 2019-07-15 DIAGNOSIS — R278 Other lack of coordination: Secondary | ICD-10-CM

## 2019-07-15 DIAGNOSIS — F411 Generalized anxiety disorder: Secondary | ICD-10-CM | POA: Diagnosis not present

## 2019-07-15 DIAGNOSIS — F902 Attention-deficit hyperactivity disorder, combined type: Secondary | ICD-10-CM

## 2019-07-15 DIAGNOSIS — Z789 Other specified health status: Secondary | ICD-10-CM

## 2019-07-15 DIAGNOSIS — F640 Transsexualism: Secondary | ICD-10-CM | POA: Diagnosis not present

## 2019-07-15 DIAGNOSIS — F1921 Other psychoactive substance dependence, in remission: Secondary | ICD-10-CM

## 2019-07-15 DIAGNOSIS — Z79899 Other long term (current) drug therapy: Secondary | ICD-10-CM

## 2019-07-15 DIAGNOSIS — Z7189 Other specified counseling: Secondary | ICD-10-CM

## 2019-07-15 MED ORDER — AMPHETAMINE SULFATE 10 MG PO TABS: 10.0000 mg | ORAL_TABLET | Freq: Every day | ORAL | 0 refills | Status: DC

## 2019-07-15 NOTE — Progress Notes (Signed)
[facility] [address] Michael [name]  Medication Check visit via Virtual Video due to COVID-19  Patient ID:  Michael [name]  male DOB: [birth year]   28 y.o.   MRN: [id number]   DATE:07/15/19  PCP: Michael Housekeeper, MD  Virtual Visit via Video Note  I connected with  Michael [name] on 07/15/19 at  3:00 PM EDT by a video enabled telemedicine application and verified that I am speaking with the correct person using two identifiers. Patient/Parent Location: at home   I discussed the limitations, risks, security and privacy concerns of performing an evaluation and management service by telephone and the availability of in person appointments. I also discussed with the parents that there may be a patient responsible charge related to this service. The parents expressed understanding and agreed to proceed.  Provider: Carron Curie, NP  Location: work location  HISTORY/CURRENT STATUS: Michael [name] is here for medication management of the psychoactive medications for ADHD and review of educational and behavioral concerns.   Michael [name] currently taking Stann Mainland, Zoloft and Buspar, which is working well. Takes medication as directed. Medication tends to last for the time intended. Michael [name] is able to focus through school/homework.   Michael [name] is eating well (eating breakfast, lunch and dinner). Eating with no issues reported.   Sleeping well (goes to bed at 11 pm wakes at 9-10 am), sleeping through the night.   EDUCATION: School: [employer]   Year/Grade: Senior  Performance/ Grades: average Services: IEP/504 Plan Applying to graduate school  Michael [name] is currently in distance learning due to social distancing due to COVID-19 and will continue for at least: Fall semester.  Activities/ Exercise: intermittently, walking  Screen  time: (phone, tablet, TV, computer): computer for school, phone, TV and movies.   MEDICAL HISTORY: Individual Medical History/ Review of Systems: Changes? :Yes,  08/15/2019 with Dr. Everardo [name] for Estrace 1 mg for transitioning.  Family Medical/ Social History: Changes? No Patient Lives with: parents  Current Medications:  Current Outpatient Medications on File Prior to Visit  Medication Sig Dispense Refill  . busPIRone (BUSPAR) 30 MG tablet TAKE 1 TABLET (30 MG TOTAL) BY MOUTH 2 (TWO) TIMES DAILY. 180 tablet 0  . estradiol (ESTRACE) 1 MG tablet Take 1 tablet (1 mg total) by mouth daily. 90 tablet 3  . sertraline (ZOLOFT) 100 MG tablet Take 1 tablet (100 mg total) by mouth daily. 3 month supply 90 tablet 2   No current facility-administered medications on file prior to visit.    Medication Side Effects: None  MENTAL HEALTH: Mental Health Issues:   Anxiety    DIAGNOSES:    ICD-10-CM   1. ADHD (attention deficit hyperactivity disorder), combined type  F90.2 Amphetamine Sulfate (EVEKEO) 10 MG TABS  2. Dysgraphia  R27.8   3. Generalized anxiety disorder  F41.1   4. Male-to-male transgender person  F64.0   5. Medication addiction in remission (HCC)  F19.21   6. Medication management  Z79.899   7. Patient counseled  Z71.9   8. Goals of care, counseling/discussion  Z71.89     RECOMMENDATIONS:  Discussed recent history with patient with updates for learning, school, graduate application, health, and medication management.  Discussed school academic progress and recommended continued accommodations for the new school year.  Referred to [employer] for resources about using distance learning with children with ADHD support.   Children and young adults with ADHD often  suffer from disorganization, difficulty with time management, completing projects and other executive function difficulties.  Recommended Reading: "Smart but Scattered" and "Smart but Scattered Teens" by Michael [name] and  Michael [name].    Discussed continued need for structure, routine, reward (external), motivation (internal), positive reinforcement, consequences, and organization for this school year.   Encouraged recommended limitations on TV, tablets, phones, video games and computers for non-educational activities.   Discussed need for bedtime routine, use of good sleep hygiene, no video games, TV or phones for an hour before bedtime.   Encouraged physical activity and outdoor play, maintaining social distancing.   Counseled medication pharmacokinetics, options, dosage, administration, desired effects, and possible side effects.   Evekeo 10 mg 1/2-1 tablet daily, # 30 with no RF's Zoloft 100 mg daily, no Rx today Buspar 30 mg BID, no Rx today RX for above e-scribed and sent to pharmacy on record  [id number] [place], [place] [address] Phone: [phone] Fax: [phone]  I discussed the assessment and treatment plan with the patient. The patient was provided an opportunity to ask questions and [name] were answered. The patient agreed with the plan and demonstrated an understanding of the instructions.   I provided 25 minutes of non-face-to-face time during this encounter. Completed record review for 10 minutes prior to the virtual video visit.   NEXT APPOINTMENT:  Return in about 3 months (around 10/15/2019) for  f/u telehealth for next appt..  The patient was advised to call back or seek an in-person evaluation if the symptoms worsen or if the condition fails to improve as anticipated.  Medical Decision-making: More than 50% of the appointment was spent counseling and discussing diagnosis and management of symptoms with the patient and family.  Michael Littler, NP

## 2019-08-24 ENCOUNTER — Other Ambulatory Visit: Payer: Self-pay

## 2019-08-26 ENCOUNTER — Other Ambulatory Visit: Payer: Self-pay

## 2019-08-26 ENCOUNTER — Ambulatory Visit (INDEPENDENT_AMBULATORY_CARE_PROVIDER_SITE_OTHER): Payer: BC Managed Care – PPO | Admitting: Endocrinology

## 2019-08-26 ENCOUNTER — Encounter: Payer: Self-pay | Admitting: Endocrinology

## 2019-08-26 VITALS — BP 122/78 | HR 115 | Ht 67.5 in | Wt 110.4 lb

## 2019-08-26 DIAGNOSIS — F640 Transsexualism: Secondary | ICD-10-CM

## 2019-08-26 DIAGNOSIS — Z789 Other specified health status: Secondary | ICD-10-CM

## 2019-08-26 NOTE — Progress Notes (Signed)
Subjective:    Patient ID: Michael [name], male    DOB: [birth year], 28 y.o.   MRN: [id number]  HPI Pt returns for f/u of transgender state (M to F) (she first suspected the transgender state at age 46; she had puberty at the age of 59; she has never had transgender surgery; she sees a Veterinary surgeon at [facility]; she has no children.  She has no risk factors for E2 rx, and was rx'ed E2 in early 2020; pt's father has questioned transgender dx). Since on the estradiol, she reports slight bilat breast development, but no assoc pain.   Past Medical History:  Diagnosis Date  . ADHD (attention deficit hyperactivity disorder)     No past surgical history on file.  Social History   Socioeconomic History  . Marital status: Single    Spouse name: Not on file  . Number of children: Not on file  . Years of education: Not on file  . Highest education level: Not on file  Occupational History  . Not on file  Social Needs  . Financial resource strain: Not on file  . Food insecurity    Worry: Not on file    Inability: Not on file  . Transportation needs    Medical: Not on file    Non-medical: Not on file  Tobacco Use  . Smoking status: Never Smoker  . Smokeless tobacco: Never Used  Substance and Sexual Activity  . Alcohol use: Not on file  . Drug use: Not on file  . Sexual activity: Not on file  Lifestyle  . Physical activity    Days per week: Not on file    Minutes per session: Not on file  . Stress: Not on file  Relationships  . Social Musician on phone: Not on file    Gets together: Not on file    Attends religious service: Not on file    Active member of club or organization: Not on file    Attends meetings of clubs or organizations: Not on file    Relationship status: Not on file  . Intimate partner violence    Fear of current or ex partner: Not on file    Emotionally abused: Not on file    Physically abused: Not on file    Forced sexual activity: Not on file   Other Topics Concern  . Not on file  Social History Narrative  . Not on file    Current Outpatient Medications on File Prior to Visit  Medication Sig Dispense Refill  . Amphetamine Sulfate (EVEKEO) 10 MG TABS Take 10 mg by mouth daily. 30 tablet 0  . busPIRone (BUSPAR) 30 MG tablet TAKE 1 TABLET (30 MG TOTAL) BY MOUTH 2 (TWO) TIMES DAILY. 180 tablet 0  . estradiol (ESTRACE) 1 MG tablet Take 1 tablet (1 mg total) by mouth daily. 90 tablet 3  . sertraline (ZOLOFT) 100 MG tablet Take 1 tablet (100 mg total) by mouth daily. 3 month supply 90 tablet 2   No current facility-administered medications on file prior to visit.     No Known Allergies  Family History  Problem Relation Age of Onset  . Asthma Mother   . Depression Mother   . Learning disabilities Mother   . ADD / ADHD Father   . Other Father        low testosterone  . Speech disorder Brother     BP 122/78 (BP Location: Right Arm, Patient Position: Sitting,  Cuff Size: Normal)   Pulse (!) 115   Ht 5' 7.5" (1.715 m)   Wt 110 lb 6.4 oz (50.1 kg)   SpO2 100%   BMI 17.04 kg/m    Review of Systems Denies leg swelling and sob    Objective:   Physical Exam VITAL SIGNS:  See vs page GENERAL: no distress BREASTS: tanner 2 bilat.       Assessment & Plan:  Transgender state: We discussed rx options.  She chooses to next add progestational agent, rather than increasing E2.  she declines ref urology.   Patient Instructions  Blood tests are requested for you today.  We'll let you know about the results.   Based on the results, we'll add progesterone if we can. Please come back for a follow-up appointment in 6 months.

## 2019-08-26 NOTE — Patient Instructions (Signed)
Blood tests are requested for you today.  We'll let you know about the results.   Based on the results, we'll add progesterone if we can. Please come back for a follow-up appointment in 6 months.

## 2019-08-28 LAB — TESTOSTERONE,FREE AND TOTAL
Testosterone, Free: 4.3 pg/mL — ABNORMAL LOW (ref 9.3–26.5)
Testosterone: 289 ng/dL (ref 264–916)

## 2019-08-31 LAB — TSH: TSH: 0.98 mIU/L (ref 0.40–4.50)

## 2019-08-31 LAB — ESTRADIOL, FREE
Estradiol, Free: 0.85 pg/mL — ABNORMAL HIGH
Estradiol: 46 pg/mL — ABNORMAL HIGH

## 2019-09-01 ENCOUNTER — Other Ambulatory Visit: Payer: Self-pay | Admitting: Endocrinology

## 2019-09-01 ENCOUNTER — Telehealth: Payer: Self-pay

## 2019-09-01 MED ORDER — NORETHINDRONE 0.35 MG PO TABS: 1.0000 | ORAL_TABLET | Freq: Every day | ORAL | 2 refills | Status: DC

## 2019-09-01 NOTE — Telephone Encounter (Signed)
-----   Message from Renato Shin, MD sent at 09/01/2019  1:03 PM EST ----- please contact patient: Testosterone is still too high. I have sent a prescription to your pharmacy, to add the other medication we discussed. Please come back for a follow-up appointment in 6 months

## 2019-09-01 NOTE — Telephone Encounter (Signed)
Called pt and informed him about the lab results as well as new orders as documented below by Dr. Loanne Drilling. Verbalized acceptance and understanding.   Disp Refills Start End   norethindrone (MICRONOR) 0.35 MG tablet 3 Package 2 09/01/2019    Sig - Route: Take 1 tablet (0.35 mg total) by mouth daily. - Oral   Sent to pharmacy as: norethindrone (MICRONOR) 0.35 MG tablet   E-Prescribing Status: Receipt confirmed by pharmacy (09/01/2019 1:03 PM EST)

## 2019-09-27 ENCOUNTER — Telehealth: Payer: Self-pay

## 2019-09-27 NOTE — Telephone Encounter (Signed)
Company: [employer]  Document: Biomedical engineer for [employer] Other records requested: [employer] and [place]  All above requested information has been faxed successfully to [employer] listed above. Documents and fax confirmation have been placed in the faxed file for future reference.

## 2019-09-27 NOTE — Telephone Encounter (Signed)
Company: [employer]  Document: Medical records request Other records requested: Most recent office notes  All above requested information has been faxed successfully to the Company listed above. Documents and fax confirmation have been placed in the faxed file for future reference.  

## 2019-10-21 ENCOUNTER — Other Ambulatory Visit: Payer: Self-pay

## 2019-10-21 ENCOUNTER — Ambulatory Visit (INDEPENDENT_AMBULATORY_CARE_PROVIDER_SITE_OTHER): Payer: BC Managed Care – PPO | Admitting: Family

## 2019-10-21 ENCOUNTER — Encounter: Payer: Self-pay | Admitting: Family

## 2019-10-21 DIAGNOSIS — F411 Generalized anxiety disorder: Secondary | ICD-10-CM

## 2019-10-21 DIAGNOSIS — R278 Other lack of coordination: Secondary | ICD-10-CM

## 2019-10-21 DIAGNOSIS — Z719 Counseling, unspecified: Secondary | ICD-10-CM

## 2019-10-21 DIAGNOSIS — F640 Transsexualism: Secondary | ICD-10-CM

## 2019-10-21 DIAGNOSIS — F902 Attention-deficit hyperactivity disorder, combined type: Secondary | ICD-10-CM | POA: Diagnosis not present

## 2019-10-21 DIAGNOSIS — Z789 Other specified health status: Secondary | ICD-10-CM

## 2019-10-21 DIAGNOSIS — Z7189 Other specified counseling: Secondary | ICD-10-CM

## 2019-10-21 DIAGNOSIS — Z79899 Other long term (current) drug therapy: Secondary | ICD-10-CM

## 2019-10-21 MED ORDER — AMPHETAMINE SULFATE 10 MG PO TABS: 10.0000 mg | ORAL_TABLET | Freq: Every day | ORAL | 0 refills | Status: DC

## 2019-10-21 NOTE — Progress Notes (Signed)
[facility] [place]. [address] [place] [address] Dept: [phone] Dept Fax: [phone]  Medication Check visit via Virtual Video due to COVID-19  Patient ID:  Michael [name]  male DOB: 1990-12-03   28 y.o.   MRN: [id number]   DATE:10/21/19  PCP: Wenda Low, MD  Virtual Visit via Video Note  I connected with  Michael [name] on 10/21/19 at 11:30 AM EST by a video enabled telemedicine application and verified that I am speaking with the correct person using two identifiers. Patient/Parent Location: at home   I discussed the limitations, risks, security and privacy concerns of performing an evaluation and management service by telephone and the availability of in person appointments. I also discussed with the parents that there may be a patient responsible charge related to this service. The parents expressed understanding and agreed to proceed.  Provider: Carolann Littler, NP  Location: private residence.   HISTORY/CURRENT STATUS: Michael [name] is here for medication management of the psychoactive medications for ADHD and review of educational and behavioral concerns.   Michael [name] currently taking Michael [name], Zoloft and Buspar,  which is working well. Takes medication as indicated and last for the time indicated. Michael [name] is able to focus through school/homework.   Michael [name] is eating well (eating breakfast, lunch and dinner). Eating with no changes.   Sleeping well (getting enough sleep), sleeping through the night.   EDUCATION: School: [employer]   Year/Grade: college  Performance/ Grades: average Services: Other: Disability Services  Michael [name] is currently in distance learning due to social distancing due to COVID-19 and will continue for at least: for the spring semester.   Activities/ Exercise: intermittently,getting outside when possible.  Screen time: (phone,  tablet, TV, computer): computer for learning, phone and TV/games.   MEDICAL HISTORY: Individual Medical History/ Review of Systems: Changes? :Yes, recent Endocrine f/u visit for transitioning. Now with adding progesterone and estrogen. Flu vaccine received. Blood work to check hormone levels with high testosterone levels.   Family Medical/ Social History: Changes? None  Patient Lives with: parents  Current Medications:  Current Outpatient Medications  Medication Instructions  . Amphetamine Sulfate (EVEKEO) 10 mg, Oral, Daily  . busPIRone (BUSPAR) 30 mg, Oral, 2 times daily  . estradiol (ESTRACE) 1 mg, Oral, Daily  . norethindrone (MICRONOR) 0.35 mg, Oral, Daily  . sertraline (ZOLOFT) 100 mg, Oral, Daily, 3 month supply   Medication Side Effects: None  MENTAL HEALTH: Mental Health Issues:   Anxiety with medications for symptom control.   DIAGNOSES:    ICD-10-CM   1. ADHD (attention deficit hyperactivity disorder), combined type  F90.2 Amphetamine Sulfate (EVEKEO) 10 MG TABS  2. Dysgraphia  R27.8   3. Generalized anxiety disorder  F41.1   4. Male-to-male transgender person  F64.0   5. Medication management  Z79.899   6. Patient counseled  Z71.9   7. Goals of care, counseling/discussion  Z71.89    RECOMMENDATIONS:  Discussed recent history with patient with updates for school, academics, graduate school, health and medications.   Discussed school academic progress and recommended continued accommodations for the next semester school year.  Discussed continued need for structure, routine, reward (external), motivation (internal), positive reinforcement, consequences, and organization with virtual learning and academics this coming semester.   Encouraged recommended limitations on TV, tablets, phones, video games and computers for non-educational activities.   Discussed need for bedtime routine, use of good sleep hygiene, no video games, TV or phones  for an hour before bedtime.     Encouraged physical activity and outdoor activity, maintaining social distancing.   Counseled medication pharmacokinetics, options, dosage, administration, desired effects, and possible side effects.   Evekeo 10 mg daily, # 30 with no RF's Zoloft 100 mg, No Rx  Buspar 30 mg daily, No Rx RX for above e-scribed and sent to pharmacy on record  [id number] [name], [place] - [address] [place] [address] Phone: [phone] Fax: [phone]  I discussed the assessment and treatment plan with the patient. The patient was provided an opportunity to ask questions and all were answered. The patient agreed with the plan and demonstrated an understanding of the instructions.   I provided 25 minutes of non-face-to-face time during this encounter. Completed record review for 10 minutes prior to the virtual video visit.   NEXT APPOINTMENT:  Return in about 3 months (around 01/19/2020) for follow up visit.  The patient was advised to call back or seek an in-person evaluation if the symptoms worsen or if the condition fails to improve as anticipated.  Medical Decision-making: More than 50% of the appointment was spent counseling and discussing diagnosis and management of symptoms with the patient and family.  Carron Curie, NP

## 2019-11-25 ENCOUNTER — Other Ambulatory Visit: Payer: Self-pay

## 2019-11-25 DIAGNOSIS — F902 Attention-deficit hyperactivity disorder, combined type: Secondary | ICD-10-CM

## 2019-11-25 NOTE — Telephone Encounter (Signed)
Patient called in for refill for Evekeo. Last visit 10/20/2018 next visit 01/27/2020. [facility] [address]

## 2019-11-28 MED ORDER — AMPHETAMINE SULFATE 10 MG PO TABS: 10.0000 mg | ORAL_TABLET | Freq: Every day | ORAL | 0 refills | Status: DC

## 2019-11-28 NOTE — Telephone Encounter (Signed)
Evekeo 10 mg daily, #30 with no RF's. .RX for above e-scribed and sent to pharmacy on record  [id number] [place], [place] - [address] [place] [address] Phone: [phone] Fax: [phone]   

## 2020-01-07 ENCOUNTER — Other Ambulatory Visit: Payer: Self-pay | Admitting: Endocrinology

## 2020-01-27 ENCOUNTER — Encounter: Payer: Self-pay | Admitting: Family

## 2020-01-27 ENCOUNTER — Telehealth (INDEPENDENT_AMBULATORY_CARE_PROVIDER_SITE_OTHER): Payer: BC Managed Care – PPO | Admitting: Family

## 2020-01-27 DIAGNOSIS — Z789 Other specified health status: Secondary | ICD-10-CM

## 2020-01-27 DIAGNOSIS — F640 Transsexualism: Secondary | ICD-10-CM | POA: Diagnosis not present

## 2020-01-27 DIAGNOSIS — F411 Generalized anxiety disorder: Secondary | ICD-10-CM | POA: Diagnosis not present

## 2020-01-27 DIAGNOSIS — F902 Attention-deficit hyperactivity disorder, combined type: Secondary | ICD-10-CM

## 2020-01-27 DIAGNOSIS — R278 Other lack of coordination: Secondary | ICD-10-CM | POA: Diagnosis not present

## 2020-01-27 DIAGNOSIS — Z719 Counseling, unspecified: Secondary | ICD-10-CM

## 2020-01-27 DIAGNOSIS — Z79899 Other long term (current) drug therapy: Secondary | ICD-10-CM

## 2020-01-27 DIAGNOSIS — Z7189 Other specified counseling: Secondary | ICD-10-CM

## 2020-01-27 DIAGNOSIS — F819 Developmental disorder of scholastic skills, unspecified: Secondary | ICD-10-CM

## 2020-01-27 MED ORDER — AMPHETAMINE SULFATE 10 MG PO TABS: 10.0000 mg | ORAL_TABLET | Freq: Every day | ORAL | 0 refills | Status: DC

## 2020-01-27 MED ORDER — SERTRALINE HCL 100 MG PO TABS: 100.0000 mg | ORAL_TABLET | Freq: Every day | ORAL | 2 refills | Status: DC

## 2020-01-27 NOTE — Progress Notes (Signed)
[facility]. [address] [place] [address] Dept: [phone] Dept Fax: [phone]  Medication Check visit via Virtual Video due to COVID-19  Patient ID:  Michael [name]  male DOB: [birth year]   29 y.o.   MRN: [id number]   DATE:01/27/20  PCP: Wenda Low, MD  Virtual Visit via Video Note  I connected with  Kasem Mozer Heberle on 01/27/20 at  3:00 PM EDT by a video enabled telemedicine application and verified that I am speaking with the correct person using two identifiers. Patient/Parent Location: at home   I discussed the limitations, risks, security and privacy concerns of performing an evaluation and management service by telephone and the availability of in person appointments. I also discussed with the parents that there may be a patient responsible charge related to this service. The parents expressed understanding and agreed to proceed.  Provider: Carolann Littler, NP  Location: private location  HISTORY/CURRENT STATUS: Lando Alcalde Sopher is here for medication management of the psychoactive medications for ADHD and review of educational and behavioral concerns.   Emma currently taking Evekeo, Zoloft and Buspar as needed,   which is working well. Takes medication as directed. Medication tends to last for the time needed. Vincent is able to focus through [employer].   Zuriel is eating well (eating breakfast, lunch and dinner). Eating well with no changes.   Sleeping well (getting enough sleep), sleeping through the night.   EDUCATION: School: [employer]-graduation in May and to apply to grad school at [place]: college  Performance/ Grades: average Services: IEP/504 Plan  Square is currently in distance learning due to social distancing due to COVID-19 and will continue through:the remainder of the school year.  Activities/ Exercise:  intermittently  Screen time: (phone, tablet, TV, computer): computer for learning, phone, TV, and games.   MEDICAL HISTORY: Individual Medical History/ Review of Systems: Changes? :No  Family Medical/ Social History: Changes? No Patient Lives with: parents  Current Medications:  Current Outpatient Medications  Medication Instructions  . Amphetamine Sulfate (EVEKEO) 10 mg, Oral, Daily  . busPIRone (BUSPAR) 30 mg, Oral, 2 times daily  . estradiol (ESTRACE) 1 MG tablet TAKE 1 TABLET BY MOUTH EVERY DAY  . norethindrone (MICRONOR) 0.35 mg, Oral, Daily  . sertraline (ZOLOFT) 100 mg, Oral, Daily, 3 month supply   Medication Side Effects: None  MENTAL HEALTH: Mental Health Issues:   Anxiety-Zoloft with good symptom control and no suicidal thoughts or ideations.  DIAGNOSES:    ICD-10-CM   1. Dysgraphia  R27.8   2. ADHD (attention deficit hyperactivity disorder), combined type  F90.2 Amphetamine Sulfate (EVEKEO) 10 MG TABS  3. Generalized anxiety disorder  F41.1 sertraline (ZOLOFT) 100 MG tablet  4. Male-to-male transgender person  F64.0   5. Learning difficulty  F81.9   6. Medication management  Z79.899   7. Patient counseled  Z71.9   8. Goals of care, counseling/discussion  Z71.89     RECOMMENDATIONS:  Discussed recent history with patient with updates for school, learning, health and medications.   Discussed school academic progress and recommended continued accommodations needed for continued learning success.   Discussed growth and development and current weight. Recommended healthy food choices, watching portion sizes, avoiding second helpings, avoiding sugary drinks like soda and tea, drinking more water, getting more exercise.   Discussed continued need for structure, routine, reward (external), motivation (internal), positive reinforcement, consequences, and organization with home and schooling.   Encouraged recommended  limitations on TV, tablets, phones, video games and  computers for non-educational activities.   Discussed need for bedtime routine, use of good sleep hygiene, no video games, TV or phones for an hour before bedtime.   Encouraged physical activity and outdoor play, maintaining social distancing.   Counseled medication pharmacokinetics, options, dosage, administration, desired effects, and possible side effects.   Zoloft 100 mg daily, # 90 with 2 RF's Evekeo 10 mg daily, # 30 with no RF's Buspar 30 mg BID, prn RX for above e-scribed and sent to pharmacy on record  [id number] [name], [place] - [address] [place] [address] Phone: [phone] Fax: [phone]  I discussed the assessment and treatment plan with the patient. The patient was provided an opportunity to ask questions and all were answered. The patient agreed with the plan and demonstrated an understanding of the instructions.   I provided 25 minutes of non-face-to-face time during this encounter. Completed record review for 10 minutes prior to the virtual video visit.   NEXT APPOINTMENT:  Return in about 3 months (around 04/27/2020) for follow up visit.  The patient was advised to call back or seek an in-person evaluation if the symptoms worsen or if the condition fails to improve as anticipated.  Medical Decision-making: More than 50% of the appointment was spent counseling and discussing diagnosis and management of symptoms with the patient and family.  Carron Curie, NP

## 2020-01-31 ENCOUNTER — Telehealth: Payer: Self-pay | Admitting: Family

## 2020-01-31 NOTE — Telephone Encounter (Signed)
T/C with patient regarding increased concerns for anxiety with fixation due to recent "fall out" with a friend. Has been working through some of the emotional aspect with his counselor. To also increase his Zoloft to 150 mg daily, no RF today. Will call to update in 2 weeks.

## 2020-02-06 ENCOUNTER — Encounter (HOSPITAL_COMMUNITY): Payer: Self-pay

## 2020-02-06 ENCOUNTER — Other Ambulatory Visit: Payer: Self-pay

## 2020-02-06 ENCOUNTER — Emergency Department (HOSPITAL_COMMUNITY)
Admission: EM | Admit: 2020-02-06 | Discharge: 2020-02-07 | Disposition: A | Discharge status: Other Acute Inpatient Hospital | Payer: BC Managed Care – PPO | Attending: Emergency Medicine | Admitting: Emergency Medicine

## 2020-02-06 DIAGNOSIS — F329 Major depressive disorder, single episode, unspecified: Secondary | ICD-10-CM | POA: Diagnosis not present

## 2020-02-06 DIAGNOSIS — R443 Hallucinations, unspecified: Secondary | ICD-10-CM | POA: Diagnosis present

## 2020-02-06 DIAGNOSIS — F909 Attention-deficit hyperactivity disorder, unspecified type: Secondary | ICD-10-CM | POA: Insufficient documentation

## 2020-02-06 DIAGNOSIS — Z79899 Other long term (current) drug therapy: Secondary | ICD-10-CM | POA: Diagnosis not present

## 2020-02-06 DIAGNOSIS — Z20822 Contact with and (suspected) exposure to covid-19: Secondary | ICD-10-CM | POA: Insufficient documentation

## 2020-02-06 DIAGNOSIS — R45851 Suicidal ideations: Secondary | ICD-10-CM

## 2020-02-06 NOTE — ED Triage Notes (Signed)
Pt brought in by mobile crisis. Pt states he wants to stab himself. Pt has been having these thoughts since the beginning of April. Pt has been hurting self to create bruises. Pt states he hears voices telling him that he is evil. Pt denies HI, denies aggressive behavior towards others.

## 2020-02-07 ENCOUNTER — Observation Stay (HOSPITAL_COMMUNITY)
Admission: AD | Admit: 2020-02-07 | Discharge: 2020-02-08 | Disposition: A | Discharge status: Other Acute Inpatient Hospital | Payer: BC Managed Care – PPO | Source: Intra-hospital | Attending: Psychiatry | Admitting: Psychiatry

## 2020-02-07 ENCOUNTER — Encounter (HOSPITAL_COMMUNITY): Payer: Self-pay | Admitting: Psychiatry

## 2020-02-07 DIAGNOSIS — F333 Major depressive disorder, recurrent, severe with psychotic symptoms: Secondary | ICD-10-CM | POA: Diagnosis present

## 2020-02-07 LAB — CBC WITH DIFFERENTIAL/PLATELET
Abs Immature Granulocytes: 0.02 10*3/uL (ref 0.00–0.07)
Basophils Absolute: 0 10*3/uL (ref 0.0–0.1)
Basophils Relative: 0 %
Eosinophils Absolute: 0.5 10*3/uL (ref 0.0–0.5)
Eosinophils Relative: 6 %
HCT: 40.7 % (ref 39.0–52.0)
Hemoglobin: 13.1 g/dL (ref 13.0–17.0)
Immature Granulocytes: 0 %
Lymphocytes Relative: 27 %
Lymphs Abs: 2.1 10*3/uL (ref 0.7–4.0)
MCH: 29 pg (ref 26.0–34.0)
MCHC: 32.2 g/dL (ref 30.0–36.0)
MCV: 90.2 fL (ref 80.0–100.0)
Monocytes Absolute: 0.8 10*3/uL (ref 0.1–1.0)
Monocytes Relative: 10 %
Neutro Abs: 4.3 10*3/uL (ref 1.7–7.7)
Neutrophils Relative %: 57 %
Platelets: 211 10*3/uL (ref 150–400)
RBC: 4.51 MIL/uL (ref 4.22–5.81)
RDW: 12.9 % (ref 11.5–15.5)
WBC: 7.6 10*3/uL (ref 4.0–10.5)
nRBC: 0 % (ref 0.0–0.2)

## 2020-02-07 LAB — COMPREHENSIVE METABOLIC PANEL
ALT: 15 U/L (ref 0–44)
AST: 19 U/L (ref 15–41)
Albumin: 4.3 g/dL (ref 3.5–5.0)
Alkaline Phosphatase: 64 U/L (ref 38–126)
Anion gap: 5 (ref 5–15)
BUN: 10 mg/dL (ref 6–20)
CO2: 28 mmol/L (ref 22–32)
Calcium: 8.8 mg/dL — ABNORMAL LOW (ref 8.9–10.3)
Chloride: 109 mmol/L (ref 98–111)
Creatinine, Ser: 0.66 mg/dL (ref 0.61–1.24)
GFR calc Af Amer: >60 mL/min (ref >60)
GFR calc non Af Amer: >60 mL/min (ref >60)
Glucose, Bld: 98 mg/dL (ref 70–99)
Potassium: 3.7 mmol/L (ref 3.5–5.1)
Sodium: 142 mmol/L (ref 135–145)
Total Bilirubin: 0.4 mg/dL (ref 0.3–1.2)
Total Protein: 7.2 g/dL (ref 6.5–8.1)

## 2020-02-07 LAB — RAPID URINE DRUG SCREEN, HOSP PERFORMED
Amphetamines: NOT DETECTED
Barbiturates: NOT DETECTED
Benzodiazepines: NOT DETECTED
Cocaine: NOT DETECTED
Opiates: NOT DETECTED
Tetrahydrocannabinol: NOT DETECTED

## 2020-02-07 LAB — ACETAMINOPHEN LEVEL: Acetaminophen (Tylenol), Serum: <10 ug/mL — ABNORMAL LOW (ref 10–30)

## 2020-02-07 LAB — SALICYLATE LEVEL: Salicylate Lvl: <7 mg/dL — ABNORMAL LOW (ref 7.0–30.0)

## 2020-02-07 LAB — RESPIRATORY PANEL BY RT PCR (FLU A&B, COVID)
Influenza A by PCR: NEGATIVE
Influenza B by PCR: NEGATIVE
SARS Coronavirus 2 by RT PCR: NEGATIVE

## 2020-02-07 LAB — ETHANOL: Alcohol, Ethyl (B): <10 mg/dL (ref <10)

## 2020-02-07 MED ORDER — AMPHETAMINE SULFATE 10 MG PO TABS: 10.0000 mg | ORAL_TABLET | Freq: Every day | ORAL | Status: DC

## 2020-02-07 MED ORDER — ARIPIPRAZOLE 2 MG PO TABS
2.0000 mg | ORAL_TABLET | Freq: Every day | ORAL | Status: DC
  Administered 2020-02-07: 2 mg via ORAL
  Filled 2020-02-07: qty 1

## 2020-02-07 MED ORDER — NORETHINDRONE 0.35 MG PO TABS: 1.0000 | ORAL_TABLET | Freq: Every day | ORAL | Status: DC

## 2020-02-07 MED ORDER — NORETHINDRONE 0.35 MG PO TABS
1.0000 | ORAL_TABLET | Freq: Every day | ORAL | Status: DC
  Administered 2020-02-08: 09:00:00 0.35 mg via ORAL

## 2020-02-07 MED ORDER — BUSPIRONE HCL 10 MG PO TABS
30.0000 mg | ORAL_TABLET | Freq: Two times a day (BID) | ORAL | Status: DC
  Administered 2020-02-07: 30 mg via ORAL
  Filled 2020-02-07: qty 3

## 2020-02-07 MED ORDER — ARIPIPRAZOLE 2 MG PO TABS
2.0000 mg | ORAL_TABLET | Freq: Every day | ORAL | Status: DC
  Administered 2020-02-08: 09:00:00 2 mg via ORAL
  Filled 2020-02-07: qty 1

## 2020-02-07 MED ORDER — BUSPIRONE HCL 15 MG PO TABS
30.0000 mg | ORAL_TABLET | Freq: Two times a day (BID) | ORAL | Status: DC
  Administered 2020-02-07 – 2020-02-08 (×2): 30 mg via ORAL
  Filled 2020-02-07 (×2): qty 2

## 2020-02-07 MED ORDER — ESTRADIOL 1 MG PO TABS
1.0000 mg | ORAL_TABLET | Freq: Every day | ORAL | Status: DC
  Administered 2020-02-07: 1 mg via ORAL
  Filled 2020-02-07: qty 1

## 2020-02-07 MED ORDER — SERTRALINE HCL 100 MG PO TABS
100.0000 mg | ORAL_TABLET | Freq: Every day | ORAL | Status: DC
  Administered 2020-02-08: 09:00:00 100 mg via ORAL
  Filled 2020-02-07: qty 1

## 2020-02-07 MED ORDER — ESTRADIOL 1 MG PO TABS
1.0000 mg | ORAL_TABLET | Freq: Every day | ORAL | Status: DC
  Administered 2020-02-08: 09:00:00 1 mg via ORAL
  Filled 2020-02-07 (×5): qty 1

## 2020-02-07 MED ORDER — SERTRALINE HCL 50 MG PO TABS
100.0000 mg | ORAL_TABLET | Freq: Every day | ORAL | Status: DC
  Administered 2020-02-07: 100 mg via ORAL
  Filled 2020-02-07: qty 2

## 2020-02-07 NOTE — ED Notes (Signed)
Transferred to [facility] by [facility]. All belongings returned to [employer] for pt. Calm and cooperative.

## 2020-02-07 NOTE — ED Notes (Signed)
Report given to [facility] at [facility] and [employer] called.

## 2020-02-07 NOTE — Progress Notes (Signed)
29 year old caucasian male admitted with SI with thoughts to stab self and AH that have worsened over the past month. Patient states, "I am transgender and I get harassed by ex-friends saying that I am a stalker and a predator. I have lost a lot of friends and things just got overwhelming".Pateint request being called by the name, "Michael [name]". At current, patient denies intent to harm self or others. Patient reports hearing voices of his friends screaming in his ear. Patient reports being compliant with his medications and reports seeing a psychiatrist outside of facility named Dr. Rene Paci. Safety measures in place to include q15 min observation rounds.

## 2020-02-07 NOTE — BH Assessment (Addendum)
Assessment Note  Michael [name] is an 29 y.o. male with history of ADHD and generalized anxiety. Patient states that he is a transgender male and identifies as a male (preferred pronouns she/her). She presents to [facility], voluntarily. She was brought to [facility] by her mother. 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 Patient explains that she is experiencing suicidal ideations which started at the beginning of April. The suicidal ideations have been on-going and increasingly intrusive. Patient states, "I've felt that I would be better off dead". Patient continues to have suicidal thoughts and doesn't feel safe if discharged home. Patient is unable to contract for safety. Patient has a plan to hang herself and/or stabb self. He has access to objects needed to hang self and kitchen knives. Patient reports that her self mutilating behaviors are superficial cutting and hitting her hand on objects.  Patient's trigger for her suicidal ideations are "loss of friends". Patient reports the following symptoms: hopelessness, worthlessness, fatigue, and crying spells. Patient does report a history of anxiety and panic attacks.  No history of abuse reported. She has a family history ADD and ADHD. His support system is his mother.  Patient denies HI. She has no history of aggressive and assaultive behaviors. No legal issues. No court dates. Patient does hear voices telling him that her that she is a "stalker" and "predator". No visual hallucinations noted.   No substance use reported.  She has a psychiatrist "Special educational needs teacher" with [facility]. States that medications are prescribed and she is compliant. She has not history of inpatient treatment.   She is oriented to time, person, place, and situation. Speech is normal. Affect is sad and depressed. Insight and judgement are both poor. Impulse control is poor. Appetite is poor. Sleep is ok with medication aids to assist.     Diagnosis: Major Depressive Disorder and ADHD  Past Medical History:  Past Medical History:  Diagnosis Date  . ADHD (attention deficit hyperactivity disorder)     History reviewed. No pertinent surgical history.  Family History:  Family History  Problem Relation Age of Onset  . Asthma Mother   . Depression Mother   . Learning disabilities Mother   . ADD / ADHD Father   . Other Father        low testosterone  . Speech disorder Brother     Social History:  reports that he has never smoked. He has never used smokeless tobacco. No history on file for alcohol and drug.  Additional Social History:  Alcohol / Drug Use Pain Medications: SEE MAR Prescriptions: SEE MAR Over the Counter: SEE MAR History of alcohol / drug use?: No history of alcohol / drug abuse Longest period of sobriety (when/how long): n/a  CIWA: CIWA-Ar BP: 117/67 Pulse Rate: 71 COWS:    Allergies: No Known Allergies  Home Medications: (Not in a hospital admission)   OB/GYN Status:  No LMP for male patient.  General Assessment Data [facility] of Assessment: [facility] TTS Assessment: In system Is this a Tele or Face-to-Face Assessment?: Face-to-Face Is this an Initial Assessment or a Re-assessment for this encounter?: Initial Assessment Patient Accompanied by:: (brought by mom and crises unit called by patient ) Language Other than English: No Living Arrangements: (lives with parents ) What gender do you identify as?: Male Marital status: Single Maiden name: ("Michael [name]") Pregnancy Status: No Living Arrangements: Parent Can pt return to current living arrangement?: Yes Admission Status: Voluntary Is patient  capable of signing voluntary admission?: Yes Referral Source: Self/Family/Friend Insurance type: Herbalist)     Crisis Care Plan Living Arrangements: Parent Legal Guardian: (no legal guardian ) Name of Psychiatrist: (Dr. 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 : Yes Suicidal Intent: Yes-Currently Present Has patient had any suicidal intent within the past 6 months prior to admission? : Yes Is patient at risk for suicide?: Yes Suicidal Plan?: Yes-Currently Present(hang or stabb self ) Has patient had any suicidal plan within the past 6 months prior to admission? : Yes Specify Current Suicidal Plan: (hang or staff self ) Access to Means: Yes Specify Access to Suicidal Means: (sharp objects such as a knife ) What has been your use of drugs/alcohol within the last 12 months?: (patient denies ) Previous Attempts/Gestures: No How many times?: (0) Other Self Harm Risks: (hit hand and cut self ) Triggers for Past Attempts: Other (Comment)("Rejection"; loss alot of friends ) Intentional Self Injurious Behavior: Cutting(cutting and hitting hand) Comment - Self Injurious Behavior: (cutting and hitting hand ) Family Suicide History: (maternal and paternal- ADD and ADHD) Recent stressful life event(s): Other (Comment)(loosing alot of friends ) Persecutory voices/beliefs?: No Depression: Yes Depression Symptoms: Feeling worthless/self pity, Loss of interest in usual pleasures, Guilt, Fatigue, Isolating, Tearfulness, Insomnia, Despondent Substance abuse history and/or treatment for substance abuse?: No Suicide prevention information given to non-admitted patients: Not applicable  Risk to Others within the past 6 months Homicidal Ideation: No Does patient have any lifetime risk of violence toward others beyond the six months prior to  admission? : No Thoughts of Harm to Others: No Current Homicidal Intent: No Current Homicidal Plan: No Access to Homicidal Means: No Identified Victim: (n/a) History of harm to others?: No Assessment of Violence: None Noted Violent Behavior Description: (n/a) Does patient have access to weapons?: No Criminal Charges Pending?: No Does patient have a court date: No Is patient on probation?: No  Psychosis Hallucinations: Auditory(Auditory-"Voices of rejection", "Stalker", "Predator") Delusions: None noted  Mental Status Report Appearance/Hygiene: In scrubs Eye Contact: Good Motor Activity: Freedom of movement Speech: Logical/coherent Level of Consciousness: Alert Mood: Depressed, Sad Affect: Sad Anxiety Level: Panic Attacks Panic attack frequency: (1x a day without meds; with meds its better ) Most recent panic attack: (02/06/2020) Thought Processes: Relevant Judgement: Impaired Orientation: Person, Place, Time, Situation Obsessive Compulsive Thoughts/Behaviors: None  Cognitive Functioning Memory: Recent Intact, Remote Intact Is patient IDD: No Insight: Poor Impulse Control: Fair Appetite: Good Have you had any weight changes? : No Change Sleep: Decreased Total Hours of Sleep: (4 to 5 hrs ) Vegetative Symptoms: Decreased grooming  ADLScreening [facility] Assessment Services) Patient's cognitive ability adequate to safely complete daily activities?: Yes Patient able to express need for assistance with ADLs?: Yes Independently performs ADLs?: Yes (appropriate for developmental age)  Prior Inpatient Therapy Prior Inpatient Therapy: No  Prior Outpatient Therapy Prior Outpatient Therapy: Yes Prior Therapy Dates: (current) Prior Therapy Facilty/Provider(s): (Bobby Barns- [facility] at [facility]) Reason for Treatment: (med management-Zoloft (recent increase last week)) Does patient have an ACCT team?: No Does patient have Intensive In-House  Services?  : No Does  patient have [facility] services? : No Does patient have P4CC services?: No  ADL Screening (condition at time of admission) Patient's cognitive ability adequate to safely complete daily activities?: Yes Is the patient deaf or have difficulty hearing?: No Does the patient have difficulty seeing, even when wearing glasses/contacts?: No Does the patient have difficulty concentrating, remembering, or making decisions?: No Patient able to express need for assistance with ADLs?: Yes Does the patient have difficulty dressing or bathing?: No Independently performs ADLs?: Yes (appropriate for developmental age) Does the patient have difficulty walking or climbing stairs?: No Weakness of Legs: None Weakness of Arms/Hands: None  Home Assistive Devices/Equipment Home Assistive Devices/Equipment: None  Therapy Consults (therapy consults require a physician order) PT Evaluation Needed: No OT Evalulation Needed: No SLP Evaluation Needed: No Abuse/Neglect Assessment (Assessment to be complete while patient is alone) Abuse/Neglect Assessment Can Be Completed: Yes Physical Abuse: Denies Verbal Abuse: Yes, present (Comment), Yes, past (Comment) Sexual Abuse: Denies Exploitation of patient/patient's resources: Denies Self-Neglect: Denies Values / Beliefs Spiritual Requests During Hospitalization: None Consults Spiritual Care Consult Needed: No Transition of Care Team Consult Needed: No Advance Directives (For Healthcare) Does Patient Have a Medical Advance Directive?: No Would patient like information on creating a medical advance directive?: No - Patient declined Nutrition Screen- [facility] Patient's home diet: Regular Has the patient recently lost weight without trying?: No Has the patient been eating poorly because of a decreased appetite?: No Malnutrition Screening Tool Score: 0        Disposition: Per Shuvon Rankin, NP, patient meets criteria for inpatient treatment.   Disposition Initial Assessment Completed for this Encounter: Yes  On Site Evaluation by:   Reviewed with Physician:    Waldon Merl 02/07/2020 7:53 AM

## 2020-02-07 NOTE — ED Provider Notes (Signed)
[facility]-EMERGENCY DEPT Provider Note   CSN: [id number] Arrival date & time: 02/06/20  2104     History Chief Complaint  Patient presents with  . Suicidal    Michael [name] is a 29 y.o. transgender male (preferred pronouns she/her), ADHD, generalized anxiety disorder presenting for evaluation of acute onset, progressively worsening suicidal ideations.  Reports that these began earlier this month, states she had a plan to hang herself but states "my friends talk me out of it".  She reports intrusive voices which are repetitive and state things such as "you are better off dead" and "you are a predator".  She denies homicidal ideation or visual hallucinations.  She is a non-smoker, denies recreational drug use or excessive alcohol use.  Today had a plan to wound herself with a knife.  Denies any medical complaints at this time.  The history is provided by the patient.       Past Medical History:  Diagnosis Date  . ADHD (attention deficit hyperactivity disorder)     Patient Active Problem List   Diagnosis Date Noted  . 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 ADHD (attention deficit hyperactivity disorder), combined type 01/25/2016  . Generalized anxiety disorder 01/25/2016  . Dysgraphia 01/25/2016    History reviewed. No pertinent surgical history.     Family History  Problem Relation Age of Onset  . Asthma Mother   . Depression Mother   . Learning disabilities Mother   . ADD / ADHD Father   . Other Father        low testosterone  . Speech disorder Brother     Social History   Tobacco Use  . Smoking status: Never Smoker  . Smokeless tobacco: Never Used  Substance Use Topics  . Alcohol use: Not on file  . Drug use: Not on file    Home Medications Prior to Admission medications   Medication Sig Start Date End Date Taking? Authorizing Provider  Amphetamine Sulfate (EVEKEO) 10 MG TABS Take 10 mg by mouth daily. 01/27/20  Yes  Paretta-Leahey, Miachel Roux, NP  busPIRone (BUSPAR) 30 MG tablet TAKE 1 TABLET (30 MG TOTAL) BY MOUTH 2 (TWO) TIMES DAILY. 07/05/19  Yes Crump, Bobi A, NP  estradiol (ESTRACE) 1 MG tablet TAKE 1 TABLET BY MOUTH EVERY DAY 01/08/20  Yes Romero Belling, MD  norethindrone (MICRONOR) 0.35 MG tablet Take 1 tablet (0.35 mg total) by mouth daily. 09/01/19  Yes Romero Belling, MD  sertraline (ZOLOFT) 100 MG tablet Take 1 tablet (100 mg total) by mouth daily. 3 month supply 01/27/20  Yes Paretta-Leahey, Miachel Roux, NP    Allergies    Patient has no known allergies.  Review of Systems   Review of Systems  Constitutional: Negative for chills and fever.  Respiratory: Negative for shortness of breath.   Cardiovascular: Negative for chest pain.  Gastrointestinal: Negative for abdominal pain, nausea and vomiting.  Psychiatric/Behavioral: Positive for hallucinations and suicidal ideas. The patient is nervous/anxious.   All other systems reviewed and are negative.   Physical Exam Updated Vital Signs BP (!) 132/93 (BP Location: Right Arm)   Pulse 70   Temp 98.4 F (36.9 C) (Oral)   Resp 17   SpO2 100%   Physical Exam Vitals and nursing note reviewed.  Constitutional:      General: He is not in acute distress.    Appearance: He is well-developed.     Comments: Dressed in purple scrubs, resting comfortably in bed  HENT:  Head: Normocephalic and atraumatic.  Eyes:     General:        Right eye: No discharge.        Left eye: No discharge.     Conjunctiva/sclera: Conjunctivae normal.  Neck:     Vascular: No JVD.     Trachea: No tracheal deviation.  Cardiovascular:     Rate and Rhythm: Normal rate and regular rhythm.  Pulmonary:     Effort: Pulmonary effort is normal.     Breath sounds: Normal breath sounds.  Abdominal:     General: Bowel sounds are normal. There is no distension.     Palpations: Abdomen is soft.     Tenderness: There is no abdominal tenderness. There is no guarding.  Skin:     Findings: No erythema.  Neurological:     Mental Status: He is alert.  Psychiatric:        Attention and Perception: He perceives auditory hallucinations.        Mood and Affect: Mood is depressed. Affect is blunt.        Speech: Speech normal.        Behavior: Behavior is withdrawn. Behavior is cooperative.        Thought Content: Thought content includes suicidal ideation. Thought content does not include homicidal ideation. Thought content includes suicidal plan. Thought content does not include homicidal plan.     Comments: Exhibits thought blocking     ED Results / Procedures / Treatments   Labs (all labs ordered are listed, but only abnormal results are displayed) Labs Reviewed  COMPREHENSIVE METABOLIC PANEL - Abnormal; Notable for the following components:      Result Value   Calcium 8.8 (*)    All other components within normal limits  SALICYLATE LEVEL - Abnormal; Notable for the following components:   Salicylate Lvl <4.1 (*)    All other components within normal limits  ACETAMINOPHEN LEVEL - Abnormal; Notable for the following components:   Acetaminophen (Tylenol), Serum <10 (*)    All other components within normal limits  RESPIRATORY PANEL BY RT PCR (FLU A&B, COVID)  ETHANOL  RAPID URINE DRUG SCREEN, HOSP PERFORMED  CBC WITH DIFFERENTIAL/PLATELET    EKG EKG Interpretation  Date/Time:  Tuesday February 07 2020 01:22:41 EDT Ventricular Rate:  73 PR Interval:    QRS Duration: 90 QT Interval:  375 QTC Calculation: 414 R Axis:   88 Text Interpretation: Sinus rhythm Right atrial enlargement When compared with ECG of 02/06/2006, No significant change was found Confirmed by Delora Fuel ([id number]) on 02/07/2020 1:31:26 AM   Radiology No results found.  Procedures Procedures (including critical care time)  Medications Ordered in ED Medications - No data to display  ED Course  I have reviewed the triage vital signs and the nursing notes.  Pertinent labs & imaging  results that were available during my care of the patient were reviewed by me and considered in my medical decision making (see chart for details).    MDM Rules/Calculators/A&P                      Patient presenting for psychiatric evaluation due to suicidal ideation with plan to stab herself.  Patient is afebrile, vital signs are stable.  Nontoxic in appearance.  Reassuring physical examination and screening labs reviewed by myself which show no leukocytosis, anemia, metabolic derangements or renal insufficiency.  Covid test is negative.  Patient is medically cleared for TTS evaluation at this time.  Of note patient is here voluntarily and may require IVC if she attempts to leave prior to psychiatric evaluation. Final Clinical Impression(s) / ED Diagnoses Final diagnoses:  Suicidal ideation    Rx / DC Orders ED Discharge Orders    None       Bennye Alm 02/07/20 0536    [name], MD 02/09/20 1442

## 2020-02-07 NOTE — ED Notes (Signed)
Patient wanded by security to move to [facility]

## 2020-02-07 NOTE — Discharge Summary (Signed)
  Stephanie Coup Kibbe, 29 y.o., male patient seen via tele psych by this provider, Dr. Lucianne Muss; and chart reviewed on 02/07/20.  On evaluation Janson Lamar Prinsen reports worsening depression with auditory hallucinations.  Unable to contract for safety.  Has Outpatient psychiatric services and recent changes in medication.  Recommended for overnight observation.   Added Abilify 2 mg as adjunct Zoloft for major depression recurrent sever with psychosis  Patient is to be transferred to [facility] Observation unit.

## 2020-02-07 NOTE — Plan of Care (Signed)
[facility] Observation Crisis Plan  Reason for Crisis Plan:  Crisis Stabilization   Plan of Care:  Referral for Telepsychiatry/Psychiatric Consult  Family Support:      Current Living Environment:  Living Arrangements: Parent  Insurance:   Hospital Account    Name Acct ID Class Status Primary Coverage   Michael [name], Michael [name] [id number] BEHAVIORAL HEALTH OBSERVATION Open BLUE CROSS BLUE SHIELD - BCBS COMM PPO        Guarantor Account (for Hospital Account [id number])    Name Relation to Pt Service Area Active? Acct Type   Michael [name] Self [facility] Yes Behavioral Health   Address Phone       [address] [place], [place] [address] [phone])          Coverage Information (for Hospital Account [id number])    F/O Payor/Plan Precert #   [facility] SHIELD/BCBS COMM PPO    Subscriber Subscriber #   Michael [name] [id number]   Address Phone   [address] [place], [place] [address] [phone]      Legal Guardian:     Primary Care Provider:  Georgann Housekeeper, MD  Current Outpatient Providers:  unknown  Psychiatrist:     Counselor/Therapist:     Compliant with Medications:  Yes  Additional Information:   [name] 4/27/20214:19 PM

## 2020-02-07 NOTE — ED Provider Notes (Signed)
Emergency Medicine Observation Re-evaluation Note  Michael [name] is a 29 y.o. male, seen on rounds today.  Pt initially presented to the ED for complaints of Suicidal Currently, the patient is awaiting placement.  Physical Exam  BP 117/67 (BP Location: Right Arm)   Pulse 71   Temp 98.2 F (36.8 C) (Oral)   Resp 17   SpO2 100%  Physical Exam  ED Course / MDM  EKG:EKG Interpretation  Date/Time:  Tuesday February 07 2020 01:22:41 EDT Ventricular Rate:  73 PR Interval:    QRS Duration: 90 QT Interval:  375 QTC Calculation: 414 R Axis:   88 Text Interpretation: Sinus rhythm Right atrial enlargement When compared with ECG of 02/06/2006, No significant change was found Confirmed by [name] ([id number]) on 02/07/2020 1:31:26 AM    I have reviewed the labs performed to date as well as medications administered while in observation.  Recent changes in the last 24 hours include none. Plan  Current plan is for psychiatric placement when bed available. Patient is not under full IVC at this time.   Lorre Nick, MD 02/07/20 1139

## 2020-02-08 ENCOUNTER — Inpatient Hospital Stay
Admission: AD | Admit: 2020-02-08 | Discharge: 2020-02-11 | DRG: 885 | Disposition: A | Discharge status: Home / Self Care | Payer: BC Managed Care – PPO | Source: Intra-hospital | Attending: Psychiatry | Admitting: Psychiatry

## 2020-02-08 ENCOUNTER — Other Ambulatory Visit: Payer: Self-pay

## 2020-02-08 ENCOUNTER — Encounter: Payer: Self-pay | Admitting: Family

## 2020-02-08 DIAGNOSIS — F902 Attention-deficit hyperactivity disorder, combined type: Secondary | ICD-10-CM | POA: Diagnosis present

## 2020-02-08 DIAGNOSIS — Z818 Family history of other mental and behavioral disorders: Secondary | ICD-10-CM

## 2020-02-08 DIAGNOSIS — F333 Major depressive disorder, recurrent, severe with psychotic symptoms: Secondary | ICD-10-CM | POA: Diagnosis not present

## 2020-02-08 DIAGNOSIS — Z789 Other specified health status: Secondary | ICD-10-CM | POA: Diagnosis present

## 2020-02-08 DIAGNOSIS — Z79899 Other long term (current) drug therapy: Secondary | ICD-10-CM | POA: Diagnosis not present

## 2020-02-08 DIAGNOSIS — F649 Gender identity disorder, unspecified: Secondary | ICD-10-CM | POA: Diagnosis present

## 2020-02-08 DIAGNOSIS — F332 Major depressive disorder, recurrent severe without psychotic features: Principal | ICD-10-CM | POA: Diagnosis present

## 2020-02-08 DIAGNOSIS — F411 Generalized anxiety disorder: Secondary | ICD-10-CM | POA: Diagnosis present

## 2020-02-08 DIAGNOSIS — G4700 Insomnia, unspecified: Secondary | ICD-10-CM | POA: Diagnosis present

## 2020-02-08 DIAGNOSIS — F840 Autistic disorder: Secondary | ICD-10-CM | POA: Diagnosis present

## 2020-02-08 DIAGNOSIS — Z825 Family history of asthma and other chronic lower respiratory diseases: Secondary | ICD-10-CM | POA: Diagnosis not present

## 2020-02-08 DIAGNOSIS — R45851 Suicidal ideations: Secondary | ICD-10-CM | POA: Diagnosis present

## 2020-02-08 DIAGNOSIS — Z91013 Allergy to seafood: Secondary | ICD-10-CM

## 2020-02-08 DIAGNOSIS — F329 Major depressive disorder, single episode, unspecified: Secondary | ICD-10-CM | POA: Diagnosis not present

## 2020-02-08 DIAGNOSIS — F640 Transsexualism: Secondary | ICD-10-CM | POA: Diagnosis present

## 2020-02-08 MED ORDER — BUSPIRONE HCL 5 MG PO TABS
30.0000 mg | ORAL_TABLET | Freq: Two times a day (BID) | ORAL | Status: DC
  Administered 2020-02-08 – 2020-02-11 (×6): 30 mg via ORAL
  Filled 2020-02-08 (×6): qty 6

## 2020-02-08 MED ORDER — SERTRALINE HCL 100 MG PO TABS
100.0000 mg | ORAL_TABLET | Freq: Every day | ORAL | Status: DC
  Administered 2020-02-09: 100 mg via ORAL
  Filled 2020-02-08: qty 1

## 2020-02-08 MED ORDER — SERTRALINE HCL 100 MG PO TABS: 100.0000 mg | ORAL_TABLET | Freq: Every day | ORAL | 0 refills | Status: DC

## 2020-02-08 MED ORDER — NORETHINDRONE 0.35 MG PO TABS: 1.0000 | ORAL_TABLET | Freq: Every day | ORAL | Status: DC

## 2020-02-08 MED ORDER — ACETAMINOPHEN 325 MG PO TABS: 650.0000 mg | ORAL_TABLET | Freq: Four times a day (QID) | ORAL | Status: DC | PRN

## 2020-02-08 MED ORDER — MAGNESIUM HYDROXIDE 400 MG/5ML PO SUSP: 30.0000 mL | Freq: Every day | ORAL | Status: DC | PRN

## 2020-02-08 MED ORDER — ARIPIPRAZOLE 2 MG PO TABS
2.0000 mg | ORAL_TABLET | Freq: Every day | ORAL | Status: DC
  Administered 2020-02-09: 2 mg via ORAL
  Filled 2020-02-08: qty 1

## 2020-02-08 MED ORDER — ARIPIPRAZOLE 2 MG PO TABS: 2.0000 mg | ORAL_TABLET | Freq: Every day | ORAL | 0 refills | Status: DC

## 2020-02-08 MED ORDER — ESTRADIOL 1 MG PO TABS
1.0000 mg | ORAL_TABLET | Freq: Every day | ORAL | Status: DC
  Administered 2020-02-09 – 2020-02-11 (×3): 1 mg via ORAL
  Filled 2020-02-08 (×3): qty 1

## 2020-02-08 MED ORDER — ALUM & MAG HYDROXIDE-SIMETH 200-200-20 MG/5ML PO SUSP: 30.0000 mL | ORAL | Status: DC | PRN

## 2020-02-08 NOTE — Progress Notes (Signed)
Patient ID: Michael [name], adult   DOB: November 10, 1990, 29 y.o.   MRN: [id number]  D: Pt alert and oriented on the unit.   A: Education, support, and encouragement provided. Discharge summary, medications and follow up appointments reviewed with pt. Suicide prevention resources provided. Pt's belongings in locker # 59 returned and belongings sheet signed.  R: Pt denies SI/HI, A/VH, pain, or any concerns at this time. Pt ambulatory on and off unit. Pt discharged to [facility] in lobby for trasport to [facility].

## 2020-02-08 NOTE — Progress Notes (Signed)
Pt resting in bed through night with with eyes closed unlabored respirations. Short periods of wakefulness/restlessness noted. Safety maintained with q15 min rounds. Monitoring continues.

## 2020-02-08 NOTE — Progress Notes (Signed)
   02/08/20 0910  Psych Admission Type (Psych Patients Only)  Admission Status Voluntary  Psychosocial Assessment  Patient Complaints Anxiety;Worrying  Eye Contact Fair  Facial Expression Flat  Affect Blunted;Depressed  Speech Pressured;Logical/coherent  Interaction Cautious;Assertive  Motor Activity Other (Comment) (WNL)  Appearance/Hygiene In scrubs  Behavior Characteristics Cooperative  Mood Anxious;Depressed  Thought Process  Coherency WDL  Content WDL  Delusions None reported or observed  Perception Hallucinations  Hallucination Auditory  Judgment Impaired  Confusion None  Danger to Self  Current suicidal ideation? Denies  Self-Injurious Behavior No self-injurious ideation or behavior indicators observed or expressed   Agreement Not to Harm Self Yes  Description of Agreement verbally contracts for safety  Danger to Others  Danger to Others None reported or observed

## 2020-02-08 NOTE — Progress Notes (Signed)
Patient ID: Michael [name], adult   DOB: 11/20/1990, 29 y.o.   MRN: [id number]  02/08/2020 Observation unit progress note   29 year old transgender male, prefers name" Michael [name]".  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  Presented for depression, suicidal ideations, auditory hallucinations. Reports she has been experiencing suicidal/self-injurious thoughts of stabbing herself over the last few weeks.  Describes auditory hallucinations telling her that she is "evil", "a predator" and that " she should die".  Describes some neurovegetative symptoms to include poor sleep, decreased appetite, low energy, some anhedonia.  Also describes increased anxiety recently. Attributes worsening symptoms to feeling rejected by some friends.  Reports history of depression, ADHDdenies prior psychiatric admissions.  Denies history of prior suicide attempts. Was being managed with Zoloft, BuSpar, Abilify, Evekeo ( Amphetamine) .  Unsure of doses.  Denies alcohol or drug use.  Denies medical illnesses.  Reports currently on hormonal therapy.  With her expressed consent I attempted to contact her mother for collateral information-mother reports patient has been experiencing increased anxiety, depression, and auditory hallucinations. Mother states patient was feeling a little better last week but worsened again over the last few days, reporting worsening hallucinations and SI.   MSE-presents alert, attentive, cooperative, no psychomotor agitation, appears somewhat guarded.  Mood is described as depressed and anxious.  Affect guarded and vaguely anxious, improved partially during session.  No thought disorder noted.  Reports suicidal ideations with thoughts of stabbing self.  No homicidal ideations.  Reports auditory hallucinations of a demeaning nature as above.  Currently does not appear internally preoccupied.  No delusions are expressed.  Assessment and plan-  29 year old transgender male presenting for  worsening depression and suicidal ideations as well as auditory hallucinations consisting of assaulting/hypercritical voices. DX- Consider MDD with psychotic features   Inpatient psychiatric admission is warranted.  Patient currently agrees with psychiatric admission and states she would sign in voluntarily. Will discontinue stimulant as may be contributing to psychotic symptoms.  Sallyanne Havers , MD.

## 2020-02-08 NOTE — Progress Notes (Signed)
Pt in bed through evening. Denied needs. Med compliant. Reports no SI/HI and that AH is lessened from earlier in day. Pt flat and minimally interactive during med pass. Med compliant. Q15 min safety monitoring continues.

## 2020-02-08 NOTE — Plan of Care (Signed)
The patient is cooperative and pleasant.  Problem: Education: Goal: Verbalization of understanding the information provided will improve Outcome: Progressing

## 2020-02-08 NOTE — Consult Note (Signed)
  Patient has been accepted to [id number]. Accepting physician is Dr. Toni Amend. Patient may arrive this evening. Patient with negative Covid-test.

## 2020-02-08 NOTE — Progress Notes (Signed)
Patient ID: Michael [name], adult   DOB: 06/08/1991, 29 y.o.   MRN: [id number]  Report called to Greig Castilla, RN at [facility]. Voluntary paperwork signed by pt, and [facility] called.

## 2020-02-08 NOTE — Progress Notes (Signed)
Report received from [place], [place].  The patient is Covid- and being prepared for transport.  Orders placed by Dr. Toni Amend.

## 2020-02-08 NOTE — Discharge Summary (Signed)
  Patient to be transferred to [facility] for inpatient psychiatric treatment

## 2020-02-08 NOTE — BH Assessment (Signed)
[facility] Assessment Progress Note  Per Nehemiah Massed, MD, this pt requires psychiatric hospitalization.  Per EPIC,  pt has been accepted to [facility] by Dr Toni Amend to Rm 307.  Pt's nurse, Ethelene Browns, has been notified.  He understands that pt must sign Voluntary Admission and Consent for Treatment before [facility] can move pt.  Please call report to [phone].     Doylene Canning, [place] Behavioral Health Coordinator [phone]

## 2020-02-08 NOTE — Tx Team (Signed)
Initial Treatment Plan 02/08/2020 6:08 PM Guerry Covington Sigg FHQ:[id number]    PATIENT STRESSORS: Educational concerns Health problems   PATIENT STRENGTHS: Ability for insight Average or above average intelligence Communication skills Motivation for treatment/growth Supportive family/friends   PATIENT IDENTIFIED PROBLEMS: Rejection  Depression                   DISCHARGE CRITERIA:  Ability to meet basic life and health needs Medical problems require only outpatient monitoring Motivation to continue treatment in a less acute level of care Verbal commitment to aftercare and medication compliance  PRELIMINARY DISCHARGE PLAN: Attend aftercare/continuing care group Return to previous living arrangement Return to previous work or school arrangements  PATIENT/FAMILY INVOLVEMENT: This treatment plan has been presented to and reviewed with the patient, CIRILO CANNER, and/or family member,.  The patient and family have been given the opportunity to ask questions and make suggestions.  Leonarda Salon, RN 02/08/2020, 6:08 PM

## 2020-02-08 NOTE — Progress Notes (Signed)
Admission Note:  The patient arrived to the unit accompanied by security.  Registration was completed, documents were signed, and belongings were secured.  A skin search was conducted with no significant findings.  The patient requested to be referred to as a "she" with the name Michael [name].  Medication containers were provided and recorded.  The patient denied current suicidal thoughts, stating that he was interested in a voluntary admission hoping to "attend some groups" and "get therapy."  Michael [name] stated that he has been depressed since April 1st, 2021.  He was provided a meal, a tour of the unit, and books to read.  Mood was anxious.  15-minute safety checks initiated.

## 2020-02-09 DIAGNOSIS — F333 Major depressive disorder, recurrent, severe with psychotic symptoms: Secondary | ICD-10-CM

## 2020-02-09 LAB — LIPID PANEL
Cholesterol: 163 mg/dL (ref 0–200)
HDL: 59 mg/dL (ref >40)
LDL Cholesterol: 87 mg/dL (ref 0–99)
Total CHOL/HDL Ratio: 2.8 RATIO
Triglycerides: 87 mg/dL (ref <150)
VLDL: 17 mg/dL (ref 0–40)

## 2020-02-09 LAB — TSH: TSH: 0.658 u[IU]/mL (ref 0.350–4.500)

## 2020-02-09 LAB — HEMOGLOBIN A1C
Hgb A1c MFr Bld: 5.4 % (ref 4.8–5.6)
Mean Plasma Glucose: 108.28 mg/dL

## 2020-02-09 MED ORDER — AMPHETAMINE-DEXTROAMPHETAMINE 5 MG PO TABS
10.0000 mg | ORAL_TABLET | Freq: Every day | ORAL | Status: DC
  Administered 2020-02-10 – 2020-02-11 (×2): 10 mg via ORAL
  Filled 2020-02-09 (×2): qty 2

## 2020-02-09 MED ORDER — SERTRALINE HCL 100 MG PO TABS
150.0000 mg | ORAL_TABLET | Freq: Every day | ORAL | Status: DC
  Administered 2020-02-10 – 2020-02-11 (×2): 150 mg via ORAL
  Filled 2020-02-09 (×2): qty 2

## 2020-02-09 MED ORDER — ARIPIPRAZOLE 5 MG PO TABS
5.0000 mg | ORAL_TABLET | Freq: Every day | ORAL | Status: DC
  Administered 2020-02-10 – 2020-02-11 (×2): 5 mg via ORAL
  Filled 2020-02-09 (×2): qty 1

## 2020-02-09 NOTE — BHH Suicide Risk Assessment (Signed)
[facility] Admission Suicide Risk Assessment   Nursing information obtained from:  Patient Demographic factors:  Adolescent or young adult, Cardell Peach, lesbian, or bisexual orientation Current Mental Status:  NA Loss Factors:  Loss of significant relationship Historical Factors:  NA Risk Reduction Factors:  Sense of responsibility to family, Religious beliefs about death  Total Time spent with patient: 1 hour Principal Problem: MDD (major depressive disorder), recurrent episode, severe (HCC) Diagnosis:  Principal Problem:   MDD (major depressive disorder), recurrent episode, severe (HCC) Active Problems:   ADHD (attention deficit hyperactivity disorder), combined type   Generalized anxiety disorder   Male-to-male transgender person  Subjective Data: Patient seen and chart reviewed.  29 year old patient with a history of anxiety and depression came to the hospital with reports of auditory hallucinations and suicidal ideation.  On interview today she states that she is no longer feeling acutely suicidal.  Hallucinations have diminished somewhat.  Patient is already seeing a therapist and on medication and is agreeable and insightful about treatment.  Continued Clinical Symptoms:    The "Alcohol Use Disorders Identification Test", Guidelines for Use in Primary Care, Second Edition.  World Science writer [facility]). Score between 0-7:  no or low risk or alcohol related problems. Score between 8-15:  moderate risk of alcohol related problems. Score between 16-19:  high risk of alcohol related problems. Score 20 or above:  warrants further diagnostic evaluation for alcohol dependence and treatment.   CLINICAL FACTORS:   Depression:   Hopelessness Impulsivity   Musculoskeletal: Strength & Muscle Tone: within normal limits Gait & Station: normal Patient leans: N/A  Psychiatric Specialty Exam: Physical Exam  Nursing note and vitals reviewed. Constitutional: She appears well-developed and  well-nourished.  HENT:  Head: Normocephalic and atraumatic.  Eyes: Pupils are equal, round, and reactive to light. Conjunctivae are normal.  Cardiovascular: Regular rhythm and normal heart sounds.  Respiratory: Effort normal.  GI: Soft.  Musculoskeletal:        General: Normal range of motion.     Cervical back: Normal range of motion.  Neurological: She is alert.  Skin: Skin is warm and dry.  Psychiatric: Judgment normal. Her affect is blunt. Her speech is delayed. She is slowed. Cognition and memory are normal. She exhibits a depressed mood. She expresses no suicidal ideation. She expresses no suicidal plans.    Review of Systems  Constitutional: Negative.   HENT: Negative.   Eyes: Negative.   Respiratory: Negative.   Cardiovascular: Negative.   Gastrointestinal: Negative.   Musculoskeletal: Negative.   Skin: Negative.   Neurological: Negative.   Psychiatric/Behavioral: Positive for dysphoric mood and suicidal ideas.    Blood pressure 115/79, pulse 79, temperature 98.7 F (37.1 C), temperature source Oral, resp. rate 17, height 5\' 8"  (1.727 m), weight 50.8 kg, SpO2 99 %.Body mass index is 17.03 kg/m.  General Appearance: Casual  Eye Contact:  Minimal  Speech:  Slow  Volume:  Decreased  Mood:  Depressed  Affect:  Congruent  Thought Process:  Coherent  Orientation:  Full (Time, Place, and Person)  Thought Content:  Logical  Suicidal Thoughts:  No  Homicidal Thoughts:  No  Memory:  Immediate;   Fair Recent;   Fair Remote;   Fair  Judgement:  Fair  Insight:  Fair  Psychomotor Activity:  Decreased  Concentration:  Concentration: Fair  Recall:  of Knowledge:  Fair  Language:  Fair  Akathisia:  No  Handed:  Right  AIMS (if indicated):  Assets:  Desire for Improvement Housing Physical Health  ADL's:  Intact  Cognition:  WNL  Sleep:  Number of Hours: 7.3      COGNITIVE FEATURES THAT CONTRIBUTE TO RISK:  None    SUICIDE RISK:   Mild:  Suicidal  ideation of limited frequency, intensity, duration, and specificity.  There are no identifiable plans, no associated intent, mild dysphoria and related symptoms, good self-control (both objective and subjective assessment), few other risk factors, and identifiable protective factors, including available and accessible social support.  PLAN OF CARE: Continue medication management and therapy.  Continue 15-minute checks.  Engage in individual and group counseling.  Assure safety prior to discharge planning  I certify that inpatient services furnished can reasonably be expected to improve the patient's condition.   Alethia Berthold, MD 02/09/2020, 11:35 AM

## 2020-02-09 NOTE — BHH Group Notes (Signed)
Balance In Life 02/09/2020 1PM  Type of Therapy/Topic:  Group Therapy:  Balance in Life  Participation Level:  Active  Description of Group:   This group will address the concept of balance and how it feels and looks when one is unbalanced. Patients will be encouraged to process areas in their lives that are out of balance and identify reasons for remaining unbalanced. Facilitators will guide patients in utilizing problem-solving interventions to address and correct the stressor making their life unbalanced. Understanding and applying boundaries will be explored and addressed for obtaining and maintaining a balanced life. Patients will be encouraged to explore ways to assertively make their unbalanced needs known to significant others in their lives, using other group members and facilitator for support and feedback.  Therapeutic Goals: 1. Patient will identify two or more emotions or situations they have that consume much of in their lives. 2. Patient will identify signs/triggers that life has become out of balance:  3. Patient will identify two ways to set boundaries in order to achieve balance in their lives:  4. Patient will demonstrate ability to communicate their needs through discussion and/or role plays  Summary of Patient Progress: Actively and appropriately participated in group session. Pt discussed with group his plans to focus on his transition and better communicate his feelings to others. Pt demonstrated insight and respected boundaries during session.   Therapeutic Modalities:   Cognitive Behavioral Therapy Solution-Focused Therapy Assertiveness Training  Romario Tith Philip Aspen, LCSW

## 2020-02-09 NOTE — Plan of Care (Signed)
Patient is in & out of room.Minimal interactions with peers.Patient stated his anxiety and depression is getting better with medications.Denies SI,HI and AVH.Compliant with medications.Attended groups.Appetite and energy level good.Support and encouragement given.

## 2020-02-09 NOTE — Tx Team (Addendum)
Interdisciplinary Treatment and Diagnostic Plan Update  02/09/2020 Time of Session: 9am Michael [name] MRN: [id number]  Principal Diagnosis: MDD (major depressive disorder), recurrent episode, severe ([place])  Secondary Diagnoses: Principal Problem:   MDD (major depressive disorder), recurrent episode, severe ([place]) Active Problems:   ADHD (attention deficit hyperactivity disorder), combined type   Generalized anxiety disorder   Male-to-male transgender person   Current Medications:  Current Facility-Administered Medications  Medication Dose Route Frequency Provider Last Rate Last Admin  . acetaminophen (TYLENOL) tablet 650 mg  650 mg Oral Q6H PRN Suella Broad, FNP      . alum & mag hydroxide-simeth (MAALOX/MYLANTA) 200-200-20 MG/5ML suspension 30 mL  30 mL Oral Q4H PRN Burt Ek, Gayland Curry, FNP      . [START ON 02/10/2020] amphetamine-dextroamphetamine (ADDERALL) tablet 10 mg  10 mg Oral Q breakfast Clapacs, John T, MD      . Derrill Memo ON 02/10/2020] ARIPiprazole (ABILIFY) tablet 5 mg  5 mg Oral Daily Clapacs, John T, MD      . busPIRone (BUSPAR) tablet 30 mg  30 mg Oral BID Suella Broad, FNP   30 mg at 02/09/20 0809  . estradiol (ESTRACE) tablet 1 mg  1 mg Oral Daily Suella Broad, FNP   1 mg at 02/09/20 0810  . magnesium hydroxide (MILK OF MAGNESIA) suspension 30 mL  30 mL Oral Daily PRN Starkes-Perry, Gayland Curry, FNP      . norethindrone (MICRONOR) 0.35 MG tablet 0.35 mg  1 tablet Oral Daily Suella Broad, FNP      . [START ON 02/10/2020] sertraline (ZOLOFT) tablet 150 mg  150 mg Oral Daily Clapacs, John T, MD       PTA Medications: Medications Prior to Admission  Medication Sig Dispense Refill Last Dose  . amphetamine-dextroamphetamine (ADDERALL) 10 MG tablet Take 10 mg by mouth daily with breakfast.   02/08/2020 at Unknown time  . ARIPiprazole (ABILIFY) 2 MG tablet Take 1 tablet (2 mg total) by mouth daily. 30 tablet 0 02/08/2020 at Unknown time   . busPIRone (BUSPAR) 30 MG tablet Take 30 mg by mouth 2 (two) times daily.   02/08/2020 at Unknown time  . estradiol (ESTRACE) 1 MG tablet Take 1 mg by mouth daily.   02/08/2020 at Unknown time  . sertraline (ZOLOFT) 100 MG tablet Take 1 tablet (100 mg total) by mouth daily. 30 tablet 0 02/08/2020 at Unknown time    Patient Stressors: Educational concerns Health problems  Patient Strengths: Ability for insight Average or above average intelligence Communication skills Motivation for treatment/growth Supportive family/friends  Treatment Modalities: Medication Management, Group therapy, Case management,  1 to 1 session with clinician, Psychoeducation, Recreational therapy.   Physician Treatment Plan for Primary Diagnosis: MDD (major depressive disorder), recurrent episode, severe ([place]) Long Term Goal(s): Improvement in symptoms so as ready for discharge Improvement in symptoms so as ready for discharge   Short Term Goals: Ability to verbalize feelings will improve Ability to disclose and discuss suicidal ideas Ability to demonstrate self-control will improve Ability to maintain clinical measurements within normal limits will improve Compliance with prescribed medications will improve  Medication Management: Evaluate patient's response, side effects, and tolerance of medication regimen.  Therapeutic Interventions: 1 to 1 sessions, Unit Group sessions and Medication administration.  Evaluation of Outcomes: Not Met  Physician Treatment Plan for Secondary Diagnosis: Principal Problem:   MDD (major depressive disorder), recurrent episode, severe ([place]) Active Problems:   ADHD (attention deficit hyperactivity disorder), combined type  Generalized anxiety disorder   Male-to-male transgender person  Long Term Goal(s): Improvement in symptoms so as ready for discharge Improvement in symptoms so as ready for discharge   Short Term Goals: Ability to verbalize feelings will  improve Ability to disclose and discuss suicidal ideas Ability to demonstrate self-control will improve Ability to maintain clinical measurements within normal limits will improve Compliance with prescribed medications will improve     Medication Management: Evaluate patient's response, side effects, and tolerance of medication regimen.  Therapeutic Interventions: 1 to 1 sessions, Unit Group sessions and Medication administration.  Evaluation of Outcomes: Not Met   RN Treatment Plan for Primary Diagnosis: MDD (major depressive disorder), recurrent episode, severe ([place]) Long Term Goal(s): Knowledge of disease and therapeutic regimen to maintain health will improve  Short Term Goals: Ability to demonstrate self-control, Ability to participate in decision making will improve, Ability to verbalize feelings will improve, Ability to disclose and discuss suicidal ideas, Ability to identify and develop effective coping behaviors will improve and Compliance with prescribed medications will improve  Medication Management: RN will administer medications as ordered by provider, will assess and evaluate patient's response and provide education to patient for prescribed medication. RN will report any adverse and/or side effects to prescribing provider.  Therapeutic Interventions: 1 on 1 counseling sessions, Psychoeducation, Medication administration, Evaluate responses to treatment, Monitor vital signs and CBGs as ordered, Perform/monitor CIWA, COWS, AIMS and Fall Risk screenings as ordered, Perform wound care treatments as ordered.  Evaluation of Outcomes: Not Met   LCSW Treatment Plan for Primary Diagnosis: MDD (major depressive disorder), recurrent episode, severe ([place]) Long Term Goal(s): Safe transition to appropriate next level of care at discharge, Engage patient in therapeutic group addressing interpersonal concerns.  Short Term Goals: Engage patient in aftercare planning with referrals and  resources  Therapeutic Interventions: Assess for all discharge needs, 1 to 1 time with Social worker, Explore available resources and support systems, Assess for adequacy in community support network, Educate family and significant other(s) on suicide prevention, Complete Psychosocial Assessment, Interpersonal group therapy.  Evaluation of Outcomes: Not Met   Progress in Treatment: Attending groups: No. Participating in groups: No. Taking medication as prescribed: Yes. Toleration medication: Yes. Family/Significant other contact made: No, will contact:  when pt gives consent Patient understands diagnosis: Yes. Discussing patient identified problems/goals with staff: Yes. Medical problems stabilized or resolved: No. Denies suicidal/homicidal ideation: No. Issues/concerns per patient self-inventory: No. Other: NA  New problem(s) identified: No, Describe:  None reported  New Short Term/Long Term Goal(s):Attend outpatient treatment, take medication as prescribed, develop and implement healthy coping methods  Patient Goals: "Therapy and the voices going away"    Discharge Plan or Barriers: Pt will return home and follow up with [place] [facility], where she is an existing patient  Reason for Continuation of Hospitalization: Depression Medication stabilization Suicidal ideation  Estimated Length of Stay:1-7 days  Recreation Therapy- Goal:  Patient will engage in groups without prompting or encouragement from LRT x3 group sessions within 5 recreation therapy group sessions  Attendees: Patient: Previti 02/09/2020 12:54 PM  Physician: Alethia Berthold 02/09/2020 12:54 PM  Nursing: Junita Push [place] 02/09/2020 12:54 PM  RN Care Manager: 02/09/2020 12:54 PM  Social Worker: Anise Salvo 02/09/2020 12:54 PM  Recreational Therapist: Roanna Epley 02/09/2020 12:54 PM  Other:  02/09/2020 12:54 PM  Other:  02/09/2020 12:54 PM  Other: 02/09/2020 12:54  PM    Scribe for Treatment Team: Yvette Rack, LCSW 02/09/2020 12:54 PM

## 2020-02-09 NOTE — BHH Group Notes (Signed)
BHH Group Notes:  (Nursing/MHT/Case Management/Adjunct)  Date:  02/09/2020  Time:  8:58 PM  Type of Therapy:  Group Therapy  Participation Level:  Active  Participation Quality:  Appropriate  Affect:  Appropriate  Cognitive:  Alert  Insight:  Good  Engagement in Group:  Engaged  Modes of Intervention:  Activity  Summary of Progress/Problems:  Michael [name] 02/09/2020, 8:58 PM
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
## 2020-02-09 NOTE — Progress Notes (Signed)
Recreation Therapy Notes  INPATIENT RECREATION THERAPY ASSESSMENT  Patient Details Name: Michael [name] MRN: [id number] DOB: 1991-01-16 Today's Date: 02/09/2020       Information Obtained From: Patient  Able to Participate in Assessment/Interview: Yes  Patient Presentation: Responsive  Reason for Admission (Per Patient): Active Symptoms, Suicidal Ideation  Patient Stressors: Friends  Coping Skills:   Talk, Read  Leisure Interests (2+):  Games - Video games, Individual - Reading, Social - Friends  Frequency of Recreation/Participation: Weekly  Awareness of Community Resources:  Yes  Community Resources:  Park  Current Use: Yes  If no, Barriers?: Transportation  Expressed Interest in [employer] Information:    [place] of Residence:  [place]  Patient Main Form of Transportation: Other (Comment)(Parents)  Patient Strengths:  caring  Patient Identified Areas of Improvement:  Getting past my trauma and the voices  Patient Goal for Hospitalization:  Get some therapy and voices to go away  Current SI (including self-harm):  No  Current HI:  No  Current AVH: Yes(Voices)  Staff Intervention Plan: Group Attendance, Collaborate with Interdisciplinary Treatment Team  Consent to Intern Participation: N/A  Michael [name] 02/09/2020, 1:43 PM

## 2020-02-09 NOTE — H&P (Signed)
Psychiatric Admission Assessment Adult  Patient Identification: Michael [name] MRN:  [id number] Date of Evaluation:  02/09/2020 Chief Complaint:  MDD (major depressive disorder), recurrent episode, severe (HCC) [F33.2] Principal Diagnosis: MDD (major depressive disorder), recurrent episode, severe (HCC) Diagnosis:  Principal Problem:   MDD (major depressive disorder), recurrent episode, severe (HCC) Active Problems:   ADHD (attention deficit hyperactivity disorder), combined type   Generalized anxiety disorder   Male-to-male transgender person  History of Present Illness: This is a 29 year old transgender woman who presented voluntarily for treatment of depression with hallucinations and suicidal ideation.  On interview today she reports that she has been feeling more depressed for several months.  Back around the beginning of the year started to have intermittent spells of hearing screaming at times when she was under a lot of stress.  This past month had some traumatic relationship issues.  Several friends and she had some kind of break-up.  This triggered suicidal thoughts and she had serious thoughts of both hanging and cutting.  Patient has been seeing a therapist recently but only just started.  Has been on psychiatric medicine including Zoloft and BuSpar and low-dose amphetamine probably for years.  Has been in the process of transitioning for a few years.  Major stresses the recent conflict with friends.  Reports that family relationship is good.  Denies alcohol or drug use.  She denies that she is having any active suicidal thoughts today. Associated Signs/Symptoms: Depression Symptoms:  depressed mood, anhedonia, insomnia, psychomotor retardation, feelings of worthlessness/guilt, difficulty concentrating, hopelessness, suicidal thoughts with specific plan, (Hypo) Manic Symptoms:  Impulsivity, Anxiety Symptoms:  Excessive Worry, Psychotic Symptoms:  Hallucinations:  Auditory PTSD Symptoms: Negative Total Time spent with patient: 1 hour  Past Psychiatric History: Patient has no previous hospitalizations.  Other than to near attempts in April no previous suicide attempts.  Very occasional self injury in the past.  Previous diagnosis with autistic spectrum disorder as a child.  Diagnosed with ADHD but only on low-dose medication.  Is the patient at risk to self? Yes.    Has the patient been a risk to self in the past 6 months? Yes.    Has the patient been a risk to self within the distant past? No.  Is the patient a risk to others? No.  Has the patient been a risk to others in the past 6 months? No.  Has the patient been a risk to others within the distant past? No.   Prior Inpatient Therapy:   Prior Outpatient Therapy:    Alcohol Screening: 1. How often do you have a drink containing alcohol?: Never 2. How many drinks containing alcohol do you have on a typical day when you are drinking?: 1 or 2 3. How often do you have six or more drinks on one occasion?: Never AUDIT-C Score: 0 Alcohol Brief Interventions/Follow-up: Alcohol Education, AUDIT Score <7 follow-up not indicated Substance Abuse History in the last 12 months:  No. Consequences of Substance Abuse: Negative Previous Psychotropic Medications: Yes  Psychological Evaluations: Yes  Past Medical History:  Past Medical History:  Diagnosis Date  . ADHD (attention deficit hyperactivity disorder)    History reviewed. No pertinent surgical history. Family History:  Family History  Problem Relation Age of Onset  . Asthma Mother   . Depression Mother   . Learning disabilities Mother   . ADD / ADHD Father   . Other Father        low testosterone  . Speech disorder Brother  Family Psychiatric  History: Reports several family members with depression but no family history of suicide Tobacco Screening:   Social History:  Social History   Substance and Sexual Activity  Alcohol Use None      Social History   Substance and Sexual Activity  Drug Use Not on file    Additional Social History:                           Allergies:   Allergies  Allergen Reactions  . Shellfish Allergy    Lab Results:  Results for orders placed or performed during the hospital encounter of 02/08/20 (from the past 48 hour(s))  Hemoglobin A1c     Status: None   Collection Time: 02/09/20  6:37 AM  Result Value Ref Range   Hgb A1c MFr Bld 5.4 4.8 - 5.6 %    Comment: (NOTE) Pre diabetes:          5.7%-6.4% Diabetes:              >6.4% Glycemic control for   <7.0% adults with diabetes    Mean Plasma Glucose 108.28 mg/dL    Comment: Performed at [facility] [address] [place], [place] [address]  Lipid panel     Status: None   Collection Time: 02/09/20  6:37 AM  Result Value Ref Range   Cholesterol 163 0 - 200 mg/dL   Triglycerides 87 <150 mg/dL   HDL 59 >40 mg/dL   Total CHOL/HDL Ratio 2.8 RATIO   VLDL 17 0 - 40 mg/dL   LDL Cholesterol 87 0 - 99 mg/dL    Comment:        Total Cholesterol/HDL:CHD Risk Coronary Heart Disease Risk Table                     Men   Women  1/2 Average Risk   3.4   3.3  Average Risk       5.0   4.4  2 X Average Risk   9.6   7.1  3 X Average Risk  23.4   11.0        Use the calculated Patient Ratio above and the CHD Risk Table to determine the patient's CHD Risk.        ATP III CLASSIFICATION (LDL):  <100     mg/dL   Optimal  100-129  mg/dL   Near or Above                    Optimal  130-159  mg/dL   Borderline  160-189  mg/dL   High  >190     mg/dL   Very High Performed at [facility], [address] [place], [place] [address]   TSH     Status: None   Collection Time: 02/09/20  6:37 AM  Result Value Ref Range   TSH 0.658 0.350 - 4.500 uIU/mL    Comment: Performed by a 3rd Generation assay with a functional sensitivity of <=0.01 uIU/mL. Performed at [facility], [address] [place], [place]  [address]     Blood Alcohol level:  Lab Results  Component Value Date   [facility] <10 23/30/0762    Metabolic Disorder Labs:  Lab Results  Component Value Date   HGBA1C 5.4 02/09/2020   MPG 108.28 02/09/2020   No results found for: PROLACTIN Lab Results  Component Value Date  CHOL 163 02/09/2020   TRIG 87 02/09/2020   HDL 59 02/09/2020   CHOLHDL 2.8 02/09/2020   VLDL 17 02/09/2020   LDLCALC 87 02/09/2020    Current Medications: Current Facility-Administered Medications  Medication Dose Route Frequency Provider Last Rate Last Admin  . acetaminophen (TYLENOL) tablet 650 mg  650 mg Oral Q6H PRN Maryagnes Amos, FNP      . alum & mag hydroxide-simeth (MAALOX/MYLANTA) 200-200-20 MG/5ML suspension 30 mL  30 mL Oral Q4H PRN Rosario Adie, Juel Burrow, FNP      . [START ON 02/10/2020] amphetamine-dextroamphetamine (ADDERALL) tablet 10 mg  10 mg Oral Q breakfast Cristen Bredeson T, MD      . Melene Muller ON 02/10/2020] ARIPiprazole (ABILIFY) tablet 5 mg  5 mg Oral Daily Arianis Bowditch T, MD      . busPIRone (BUSPAR) tablet 30 mg  30 mg Oral BID Maryagnes Amos, FNP   30 mg at 02/09/20 0809  . estradiol (ESTRACE) tablet 1 mg  1 mg Oral Daily Maryagnes Amos, FNP   1 mg at 02/09/20 0810  . magnesium hydroxide (MILK OF MAGNESIA) suspension 30 mL  30 mL Oral Daily PRN Starkes-Perry, Juel Burrow, FNP      . norethindrone (MICRONOR) 0.35 MG tablet 0.35 mg  1 tablet Oral Daily Maryagnes Amos, FNP      . [START ON 02/10/2020] sertraline (ZOLOFT) tablet 150 mg  150 mg Oral Daily Jael Waldorf T, MD       PTA Medications: Medications Prior to Admission  Medication Sig Dispense Refill Last Dose  . amphetamine-dextroamphetamine (ADDERALL) 10 MG tablet Take 10 mg by mouth daily with breakfast.   02/08/2020 at Unknown time  . ARIPiprazole (ABILIFY) 2 MG tablet Take 1 tablet (2 mg total) by mouth daily. 30 tablet 0 02/08/2020 at Unknown time  . busPIRone (BUSPAR) 30 MG tablet Take 30 mg by mouth 2  (two) times daily.   02/08/2020 at Unknown time  . estradiol (ESTRACE) 1 MG tablet Take 1 mg by mouth daily.   02/08/2020 at Unknown time  . sertraline (ZOLOFT) 100 MG tablet Take 1 tablet (100 mg total) by mouth daily. 30 tablet 0 02/08/2020 at Unknown time    Musculoskeletal: Strength & Muscle Tone: within normal limits Gait & Station: normal Patient leans: N/A  Psychiatric Specialty Exam: Physical Exam  Nursing note and vitals reviewed. Constitutional: She appears well-developed and well-nourished.  HENT:  Head: Normocephalic and atraumatic.  Eyes: Pupils are equal, round, and reactive to light. Conjunctivae are normal.  Cardiovascular: Regular rhythm and normal heart sounds.  Respiratory: Effort normal. No respiratory distress.  GI: Soft.  Musculoskeletal:        General: Normal range of motion.     Cervical back: Normal range of motion.  Neurological: She is alert.  Skin: Skin is warm and dry.  Psychiatric: Judgment normal. Her affect is blunt. Her speech is delayed. She is slowed. Cognition and memory are normal. She exhibits a depressed mood. She expresses no suicidal ideation.    Review of Systems  Constitutional: Negative.   HENT: Negative.   Eyes: Negative.   Respiratory: Negative.   Cardiovascular: Negative.   Gastrointestinal: Negative.   Musculoskeletal: Negative.   Skin: Negative.   Neurological: Negative.   Psychiatric/Behavioral: Positive for dysphoric mood and suicidal ideas.    Blood pressure 115/79, pulse 79, temperature 98.7 F (37.1 C), temperature source Oral, resp. rate 17, height 5\' 8"  (1.727 m), weight 50.8 kg, SpO2 99 %.Body  mass index is 17.03 kg/m.  General Appearance: Disheveled  Eye Contact:  Minimal  Speech:  Slow  Volume:  Decreased  Mood:  Depressed and Dysphoric  Affect:  Congruent  Thought Process:  Coherent  Orientation:  Full (Time, Place, and Person)  Thought Content:  Logical and Hallucinations: Auditory  Suicidal Thoughts:  No   Homicidal Thoughts:  No  Memory:  Immediate;   Fair Recent;   Fair Remote;   Fair  Judgement:  Fair  Insight:  Fair  Psychomotor Activity:  Normal  Concentration:  Concentration: Fair  Recall:  [employer] of Knowledge:  Fair  Language:  Fair  Akathisia:  No  Handed:  Right  AIMS (if indicated):     Assets:  Desire for Improvement Housing Resilience Social Support  ADL's:  Intact  Cognition:  WNL  Sleep:  Number of Hours: 7.3    Treatment Plan Summary: Daily contact with patient to assess and evaluate symptoms and progress in treatment, Medication management and Plan Patient was just recently within the last couple days started on low-dose Abilify in addition to the Zoloft and BuSpar.  Reviewed medication with her and suggested that we increase the Abilify to 5 mg.  Side effects discussed and she is agreeable.  Also recommended increasing sertraline to 150 mg which she is also agreeable with.  Continue 15-minute checks and engagement in individual and group counseling.  Sure we have outpatient follow-up arranged and reassess dangerousness prior to discharge.  Observation Level/Precautions:  15 minute checks  Laboratory:  Chemistry Profile  Psychotherapy:    Medications:    Consultations:    Discharge Concerns:    Estimated LOS:  Other:     Physician Treatment Plan for Primary Diagnosis: MDD (major depressive disorder), recurrent episode, severe (HCC) Long Term Goal(s): Improvement in symptoms so as ready for discharge  Short Term Goals: Ability to verbalize feelings will improve, Ability to disclose and discuss suicidal ideas and Ability to demonstrate self-control will improve  Physician Treatment Plan for Secondary Diagnosis: Principal Problem:   MDD (major depressive disorder), recurrent episode, severe (HCC) Active Problems:   ADHD (attention deficit hyperactivity disorder), combined type   Generalized anxiety disorder   Male-to-male transgender person  Long Term  Goal(s): Improvement in symptoms so as ready for discharge  Short Term Goals: Ability to maintain clinical measurements within normal limits will improve and Compliance with prescribed medications will improve  I certify that inpatient services furnished can reasonably be expected to improve the patient's condition.    Mordecai Rasmussen, MD 4/29/202111:47 AM

## 2020-02-09 NOTE — Progress Notes (Signed)
Took over care of patient at 11pm. Patient slept all of shift. In no distress.

## 2020-02-09 NOTE — Progress Notes (Signed)
Recreation Therapy Notes  Date: 02/09/2020  Time: 9:30 am  Location: Outside  Behavioral response: N/A  Group Type: Leisure  Participation level: N/A  Communication: N/A  Comments: N/A  Kota Ciancio LRT/CTRS        Corie Allis 02/09/2020 12:45 PM

## 2020-02-10 NOTE — Progress Notes (Signed)
   02/10/20 1015  Clinical Encounter Type  Visited With Patient  Visit Type Initial;Behavioral Health  Referral From Nurse  Consult/Referral To Chaplain  While rounding patient was referred to chaplain by nurse. Chaplain visited with patient while he sat in the dayroom. Patient said taking his meds has helped. Med adjustment has helped a lot. Patient said he would like to get the facial hair off of his face. Chaplain told him to ask staff if he could shave. Chaplain made patient aware of chaplains availability and left.

## 2020-02-10 NOTE — [facility] Suicide Risk Assessment (Signed)
[facility] INPATIENT:  Family/Significant Other Suicide Prevention Education  Suicide Prevention Education:  Education Completed; Michael [name], father [phone] has been identified by the patient as the family member/significant other with whom the patient will be residing, and identified as the person(s) who will aid the patient in the event of a mental health crisis (suicidal ideations/suicide attempt).  With written consent from the patient, the family member/significant other has been provided the following suicide prevention education, prior to the and/or following the discharge of the patient.  The suicide prevention education provided includes the following:  Suicide risk factors  Suicide prevention and interventions  National Suicide Hotline telephone number  [facility] assessment telephone number  [facility] Emergency Assistance 911  [facility] and/or Residential Mobile Crisis Unit telephone number  Request made of family/significant other to:  Remove weapons (e.g., guns, rifles, knives), all items previously/currently identified as safety concern.    Remove drugs/medications (over-the-counter, prescriptions, illicit drugs), all items previously/currently identified as a safety concern.  The family member/significant other verbalizes understanding of the suicide prevention education information provided.  The family member/significant other agrees to remove the items of safety concern listed above. Mr. Rod reports believing the patient admission may have been triggered by Covid 19 and having to  attend school remotely. He states the patient is a part of an online transgender support group and says "there was a fall out with the support group." He says "there was a lot of shaming by group members." [name] says the patient had been hearing voices "telling him profanities and obscenities." He denies the patient having access to a firearm and says they have  removed all sharp objects.   Michael [name] T Mylisa Brunson 02/10/2020, 11:47 AM

## 2020-02-10 NOTE — Plan of Care (Signed)
Pt rates depression 4/10, hopelessness 2/10 and anxiety 3/10. Pt was educated on care plan and verbalizes understanding. Torrie Mayers RN Problem: Education: Goal: Knowledge of [facility] Education information/materials will improve Outcome: Progressing Goal: Mental status will improve Outcome: Progressing Goal: Verbalization of understanding the information provided will improve Outcome: Progressing   Problem: Coping: Goal: Ability to verbalize frustrations and anger appropriately will improve Outcome: Progressing Goal: Ability to demonstrate self-control will improve Outcome: Progressing   Problem: Health Behavior/Discharge Planning: Goal: Identification of resources available to assist in meeting health care needs will improve Outcome: Progressing Goal: Compliance with treatment plan for underlying cause of condition will improve Outcome: Progressing   Problem: Health Behavior/Discharge Planning: Goal: Identification of resources available to assist in meeting health care needs will improve Outcome: Progressing Goal: Compliance with treatment plan for underlying cause of condition will improve Outcome: Progressing  Pt rates depression, Pt rates depressio

## 2020-02-10 NOTE — Progress Notes (Signed)
Pt pleasant during assessment denying SI/HI/AVH and pain. Patient endorses anxiety/depression stating it was from people in her life not accepting her transitioning to a male. Patient given education, support and encouragement to be active in her treatment plan. Patient didn't have any scheduled medications this evening and didn't request anything PRN. Patient being monitored Q 15 minutes for safety per unit protocol. Patient remains safe on the unit.

## 2020-02-10 NOTE — [facility] Group Notes (Signed)
LCSW Group Therapy Note   02/10/2020 1:00 PM  Type of Therapy and Topic:  Group Therapy:  Overcoming Obstacles   Participation Level:  Active   Description of Group:    In this group patients will be encouraged to explore what they see as obstacles to their own wellness and recovery. They will be guided to discuss their thoughts, feelings, and behaviors related to these obstacles. The group will process together ways to cope with barriers, with attention given to specific choices patients can make. Each patient will be challenged to identify changes they are motivated to make in order to overcome their obstacles. This group will be process-oriented, with patients participating in exploration of their own experiences as well as giving and receiving support and challenge from other group members.   Therapeutic Goals: 1. Patient will identify personal and current obstacles as they relate to admission. 2. Patient will identify barriers that currently interfere with their wellness or overcoming obstacles.  3. Patient will identify feelings, thought process and behaviors related to these barriers. 4. Patient will identify two changes they are willing to make to overcome these obstacles:      Summary of Patient Progress Patient was present in group.  She was active and supportive of other group members.  She reports that the biggest obstacle she is currently facing is trangender rights and access to surgeries/medications that are needed for his transition not being accessible through insurance.   Therapeutic Modalities:   Cognitive Behavioral Therapy Solution Focused Therapy Motivational Interviewing Relapse Prevention Therapy  Penni Homans, MSW, LCSW 02/10/2020 12:43 PM

## 2020-02-10 NOTE — Progress Notes (Signed)
[facility] MD Progress Note  02/10/2020 5:54 PM Michael [name]  MRN:  [id number] Subjective: Follow-up for this 29 year old transgender woman who presented with anxiety and depression.  On interview today she states that she is feeling significantly better.  Denies not only suicidal thought but also denies any hallucinations or sounds of screaming.  Patient has no complaints about medicine.  Patient has been compliant with treatment.  Patient gave consent that I should return phone call to her father.  Atteberry good conversation with the father who has I think good insight and understanding of the multiple issues at play. Principal Problem: MDD (major depressive disorder), recurrent episode, severe (HCC) Diagnosis: Principal Problem:   MDD (major depressive disorder), recurrent episode, severe (HCC) Active Problems:   ADHD (attention deficit hyperactivity disorder), combined type   Generalized anxiety disorder   Male-to-male transgender person  Total Time spent with patient: 30 minutes  Past Psychiatric History: Patient has a history of anxiety and depression and ADHD as well as a diagnosis of autistic spectrum disorder  Past Medical History:  Past Medical History:  Diagnosis Date  . ADHD (attention deficit hyperactivity disorder)    History reviewed. No pertinent surgical history. Family History:  Family History  Problem Relation Age of Onset  . Asthma Mother   . Depression Mother   . Learning disabilities Mother   . ADD / ADHD Father   . Other Father        low testosterone  . Speech disorder Brother    Family Psychiatric  History: See previous Social History:  Social History   Substance and Sexual Activity  Alcohol Use None     Social History   Substance and Sexual Activity  Drug Use Not on file    Social History   Socioeconomic History  . Marital status: Single    Spouse name: Not on file  . Number of children: Not on file  . Years of education: Not on file   . Highest education level: Not on file  Occupational History  . Not on file  Tobacco Use  . Smoking status: Never Smoker  . Smokeless tobacco: Never Used  Substance and Sexual Activity  . Alcohol use: Not on file  . Drug use: Not on file  . Sexual activity: Not on file  Other Topics Concern  . Not on file  Social History Narrative  . Not on file   Social Determinants of Health   Financial Resource Strain:   . Difficulty of Paying Living Expenses:   Food Insecurity:   . Worried About Programme researcher, broadcasting/film/video in the Last Year:   . Barista in the Last Year:   Transportation Needs:   . Freight forwarder (Medical):   Marland Kitchen Lack of Transportation (Non-Medical):   Physical Activity:   . Days of Exercise per Week:   . Minutes of Exercise per Session:   Stress:   . Feeling of Stress :   Social Connections:   . Frequency of Communication with Friends and Family:   . Frequency of Social Gatherings with Friends and Family:   . Attends Religious Services:   . Active Member of Clubs or Organizations:   . Attends Banker Meetings:   Marland Kitchen Marital Status:    Additional Social History:                         Sleep: Fair  Appetite:  Fair  Current Medications:  Current Facility-Administered Medications  Medication Dose Route Frequency Provider Last Rate Last Admin  . acetaminophen (TYLENOL) tablet 650 mg  650 mg Oral Q6H PRN Suella Broad, FNP      . alum & mag hydroxide-simeth (MAALOX/MYLANTA) 200-200-20 MG/5ML suspension 30 mL  30 mL Oral Q4H PRN Burt Ek, Gayland Curry, FNP      . amphetamine-dextroamphetamine (ADDERALL) tablet 10 mg  10 mg Oral Q breakfast Terril Amaro, Madie Reno, MD   10 mg at 02/10/20 6578  . ARIPiprazole (ABILIFY) tablet 5 mg  5 mg Oral Daily Fumie Fiallo, Madie Reno, MD   5 mg at 02/10/20 0811  . busPIRone (BUSPAR) tablet 30 mg  30 mg Oral BID Suella Broad, FNP   30 mg at 02/10/20 1658  . estradiol (ESTRACE) tablet 1 mg  1 mg Oral  Daily Suella Broad, FNP   1 mg at 02/10/20 4696  . magnesium hydroxide (MILK OF MAGNESIA) suspension 30 mL  30 mL Oral Daily PRN Starkes-Perry, Gayland Curry, FNP      . norethindrone (MICRONOR) 0.35 MG tablet 0.35 mg  1 tablet Oral Daily Suella Broad, FNP      . sertraline (ZOLOFT) tablet 150 mg  150 mg Oral Daily Tayvien Kane, Madie Reno, MD   150 mg at 02/10/20 2952    Lab Results:  Results for orders placed or performed during the hospital encounter of 02/08/20 (from the past 48 hour(s))  Hemoglobin A1c     Status: None   Collection Time: 02/09/20  6:37 AM  Result Value Ref Range   Hgb A1c MFr Bld 5.4 4.8 - 5.6 %    Comment: (NOTE) Pre diabetes:          5.7%-6.4% Diabetes:              >6.4% Glycemic control for   <7.0% adults with diabetes    Mean Plasma Glucose 108.28 mg/dL    Comment: Performed at [facility], [place] [address] [place], [place] [address]  Lipid panel     Status: None   Collection Time: 02/09/20  6:37 AM  Result Value Ref Range   Cholesterol 163 0 - 200 mg/dL   Triglycerides 87 <150 mg/dL   HDL 59 >40 mg/dL   Total CHOL/HDL Ratio 2.8 RATIO   VLDL 17 0 - 40 mg/dL   LDL Cholesterol 87 0 - 99 mg/dL    Comment:        Total Cholesterol/HDL:CHD Risk Coronary Heart Disease Risk Table                     Men   Women  1/2 Average Risk   3.4   3.3  Average Risk       5.0   4.4  2 X Average Risk   9.6   7.1  3 X Average Risk  23.4   11.0        Use the calculated Patient Ratio above and the CHD Risk Table to determine the patient's CHD Risk.        ATP III CLASSIFICATION (LDL):  <100     mg/dL   Optimal  100-129  mg/dL   Near or Above                    Optimal  130-159  mg/dL   Borderline  160-189  mg/dL   High  >190     mg/dL   Very High Performed at [facility]  [facility], [address] [place], [place] [address]   TSH     Status: None   Collection Time: 02/09/20  6:37 AM  Result Value Ref Range   TSH 0.658 0.350 - 4.500 uIU/mL     Comment: Performed by a 3rd Generation assay with a functional sensitivity of <=0.01 uIU/mL. Performed at [facility], [address] [place], [place] [address]     Blood Alcohol level:  Lab Results  Component Value Date   [facility] <10 02/07/2020    Metabolic Disorder Labs: Lab Results  Component Value Date   HGBA1C 5.4 02/09/2020   MPG 108.28 02/09/2020   No results found for: PROLACTIN Lab Results  Component Value Date   CHOL 163 02/09/2020   TRIG 87 02/09/2020   HDL 59 02/09/2020   CHOLHDL 2.8 02/09/2020   VLDL 17 02/09/2020   LDLCALC 87 02/09/2020    Physical Findings: AIMS:  , ,  ,  ,    CIWA:    COWS:     Musculoskeletal: Strength & Muscle Tone: within normal limits Gait & Station: normal Patient leans: N/A  Psychiatric Specialty Exam: Physical Exam  Nursing note and vitals reviewed. Constitutional: She appears well-developed and well-nourished.  HENT:  Head: Normocephalic and atraumatic.  Eyes: Pupils are equal, round, and reactive to light. Conjunctivae are normal.  Cardiovascular: Regular rhythm and normal heart sounds.  Respiratory: Effort normal. No respiratory distress.  GI: Soft.  Musculoskeletal:        General: Normal range of motion.     Cervical back: Normal range of motion.  Neurological: She is alert.  Skin: Skin is warm and dry.  Psychiatric: Her speech is normal and behavior is normal. Judgment and thought content normal. Her mood appears anxious. She is not agitated. Cognition and memory are normal.    Review of Systems  Constitutional: Negative.   HENT: Negative.   Eyes: Negative.   Respiratory: Negative.   Cardiovascular: Negative.   Gastrointestinal: Negative.   Musculoskeletal: Negative.   Skin: Negative.   Neurological: Negative.   Psychiatric/Behavioral: The patient is nervous/anxious.     Blood pressure 115/79, pulse 79, temperature 98.7 F (37.1 C), temperature source Oral, resp. rate 17, height 5\' 8"  (1.727 m),  weight 50.8 kg, SpO2 99 %.Body mass index is 17.03 kg/m.  General Appearance: Casual  Eye Contact:  Fair  Speech:  Clear and Coherent  Volume:  Decreased  Mood:  Euthymic  Affect:  Constricted  Thought Process:  Coherent  Orientation:  Full (Time, Place, and Person)  Thought Content:  Logical  Suicidal Thoughts:  No  Homicidal Thoughts:  No  Memory:  Immediate;   Fair Recent;   Fair Remote;   Fair  Judgement:  Fair  Insight:  Fair  Psychomotor Activity:  Normal  Concentration:  Concentration: Fair  Recall:  of Knowledge:  Fair  Language:  Fair  Akathisia:  No  Handed:  Right  AIMS (if indicated):     Assets:  Desire for Improvement Housing Resilience Social Support  ADL's:  Intact  Cognition:  Impaired,  Mild  Sleep:  Number of Hours: 5.75     Treatment Plan Summary: Daily contact with patient to assess and evaluate symptoms and progress in treatment, Medication management and Plan Patient is reporting improved symptoms and appears more relaxed and clearheaded today.  No evidence of acute suicidality or psychosis.  Tolerating medicine well.  Patient seems to be stabilizing and very motivated for  treatment.  Likely ready for discharge by tomorrow.  Mordecai Rasmussen, MD 02/10/2020, 5:54 PM

## 2020-02-10 NOTE — Plan of Care (Signed)
Patient stated she was feeling better today   Problem: Education: Goal: Mental status will improve Outcome: Progressing

## 2020-02-10 NOTE — Progress Notes (Signed)
Pt has been calm and cooperative today. Pt has attended groups and been social in the day room. Torrie Mayers RN

## 2020-02-11 DIAGNOSIS — F840 Autistic disorder: Secondary | ICD-10-CM

## 2020-02-11 MED ORDER — ARIPIPRAZOLE 5 MG PO TABS: 5.0000 mg | ORAL_TABLET | Freq: Every day | ORAL | 1 refills | Status: DC

## 2020-02-11 MED ORDER — NORETHINDRONE 0.35 MG PO TABS: 1.0000 | ORAL_TABLET | Freq: Every day | ORAL | 0 refills | Status: DC

## 2020-02-11 MED ORDER — SERTRALINE HCL 100 MG PO TABS: 150.0000 mg | ORAL_TABLET | Freq: Every day | ORAL | 1 refills | Status: DC

## 2020-02-11 MED ORDER — AMPHETAMINE-DEXTROAMPHETAMINE 5 MG PO TABS: 10.0000 mg | ORAL_TABLET | Freq: Every day | ORAL | 0 refills | Status: DC

## 2020-02-11 MED ORDER — BUSPIRONE HCL 30 MG PO TABS: 30.0000 mg | ORAL_TABLET | Freq: Two times a day (BID) | ORAL | 1 refills | Status: DC

## 2020-02-11 NOTE — BHH Suicide Risk Assessment (Signed)
[facility] Discharge Suicide Risk Assessment   Principal Problem: MDD (major depressive disorder), recurrent episode, severe (HCC) Discharge Diagnoses: Principal Problem:   MDD (major depressive disorder), recurrent episode, severe (HCC) Active Problems:   ADHD (attention deficit hyperactivity disorder), combined type   Generalized anxiety disorder   Male-to-male transgender person   Autistic spectrum disorder   Total Time spent with patient: 30 minutes  Musculoskeletal: Strength & Muscle Tone: within normal limits Gait & Station: normal Patient leans: N/A  Psychiatric Specialty Exam: Review of Systems  Constitutional: Negative.   HENT: Negative.   Eyes: Negative.   Respiratory: Negative.   Cardiovascular: Negative.   Gastrointestinal: Negative.   Musculoskeletal: Negative.   Skin: Negative.   Neurological: Negative.   Psychiatric/Behavioral: Negative for dysphoric mood and suicidal ideas.    Blood pressure 108/73, pulse 93, temperature 98.1 F (36.7 C), temperature source Oral, resp. rate 17, height 5\' 8"  (1.727 m), weight 50.8 kg, SpO2 95 %.Body mass index is 17.03 kg/m.  General Appearance: Casual  Eye Contact::  Good  Speech:  Clear and Coherent409  Volume:  Normal  Mood:  Euthymic  Affect:  Constricted  Thought Process:  Coherent  Orientation:  Full (Time, Place, and Person)  Thought Content:  Logical  Suicidal Thoughts:  No  Homicidal Thoughts:  No  Memory:  Immediate;   Fair Recent;   Fair Remote;   Fair  Judgement:  Fair  Insight:  Fair  Psychomotor Activity:  Normal  Concentration:  Fair  Recall:  [id number] of Knowledge:Fair  Language: Fair  Akathisia:  No  Handed:  Right  AIMS (if indicated):     Assets:  Desire for Improvement Housing Physical Health Resilience Social Support  Sleep:  Number of Hours: 7  Cognition: WNL  ADL's:  Intact   Mental Status Per Nursing Assessment::   On Admission:  NA  Demographic Factors:  Male, Caucasian and  Gay, lesbian, or bisexual orientation  Loss Factors: Loss of significant relationship  Historical Factors: NA  Risk Reduction Factors:   Living with another person, especially a relative, Positive social support and Positive therapeutic relationship  Continued Clinical Symptoms:  Depression:   Impulsivity  Cognitive Features That Contribute To Risk:  None    Suicide Risk:  Minimal: No identifiable suicidal ideation.  Patients presenting with no risk factors but with morbid ruminations; may be classified as minimal risk based on the severity of the depressive symptoms  Follow-up Information    [facility] Follow up.   Contact information: [address]  [place], [place] [phone] [phone]          Plan Of Care/Follow-up recommendations:  Activity:  Activity as tolerated Diet:  Regular diet Other:  Follow-up with psychiatric providers and medication and therapy as recommended  [phone], MD 02/11/2020, 11:31 AM

## 2020-02-11 NOTE — Progress Notes (Signed)
Patient was provided with discharge summary, Transition packet, and Suicide Risk Assessment. Verbalized discharge summary, transition packet and suicide risk assessment, patient made aware of any change in medications, and all upcoming appointments.   Patient belongings/money returned. Personal Medications returned.   Patient denied any SI, denies plans for self harm or the harm of others. Patient is calm, with appropriate affect and eye contact. Patient reports no complaints at this time.   Patient will be discharged to self.

## 2020-02-11 NOTE — Progress Notes (Signed)
  [facility] Case Management Discharge Plan :  Will you be returning to the same living situation after discharge:  Yes,  with parents At discharge, do you have transportation home?: Yes,  father Do you have the ability to pay for your medications: Yes,  BCBS  Release of information consent forms completed and in the chart;  Patient's signature needed at discharge.  Patient to Follow up at: Follow-up Information    [facility]. Call.   Why: A request was made for your appointment.  Please contact the office to confirm the date and time.   Contact information: [address] [place]  [place], [place] [address] [phone] Fax:[phone]          Next level of care provider has access to [facility] Link:yes  Safety Planning and Suicide Prevention discussed: Yes,  with father     Has patient been referred to the Quitline?: N/A patient is not a smoker  Patient has been referred for addiction treatment: N/A  Lorri Frederick, LCSW 02/11/2020, 12:28 PM

## 2020-02-11 NOTE — Discharge Summary (Signed)
Physician Discharge Summary Note  Patient:  Michael [name] is an 29 y.o., adult MRN:  [id number] DOB:  08/05/1991 Patient phone:  [phone] (home)  Patient address:   [address] [place] [address],  Total Time spent with patient: 30 minutes  Date of Admission:  02/08/2020 Date of Discharge: Feb 11, 2020  Reason for Admission: Patient was admitted because of worsening depression and anxiety with suicidal ideation and auditory hallucinations  Principal Problem: MDD (major depressive disorder), recurrent episode, severe (HCC) Discharge Diagnoses: Principal Problem:   MDD (major depressive disorder), recurrent episode, severe (HCC) Active Problems:   ADHD (attention deficit hyperactivity disorder), combined type   Generalized anxiety disorder   Male-to-male transgender person   Autistic spectrum disorder   Past Psychiatric History: Patient has a history of anxiety and depression on top of autistic spectrum disorder and possible ADHD.  Ongoing life stress related to transgender status.  Patient has therapy and medication management already  Past Medical History:  Past Medical History:  Diagnosis Date  . ADHD (attention deficit hyperactivity disorder)    History reviewed. No pertinent surgical history. Family History:  Family History  Problem Relation Age of Onset  . Asthma Mother   . Depression Mother   . Learning disabilities Mother   . ADD / ADHD Father   . Other Father        low testosterone  . Speech disorder Brother    Family Psychiatric  History: See previous notes Social History:  Social History   Substance and Sexual Activity  Alcohol Use None     Social History   Substance and Sexual Activity  Drug Use Not on file    Social History   Socioeconomic History  . Marital status: Single    Spouse name: Not on file  . Number of children: Not on file  . Years of education: Not on file  . Highest education level: Not on file  Occupational  History  . Not on file  Tobacco Use  . Smoking status: Never Smoker  . Smokeless tobacco: Never Used  Substance and Sexual Activity  . Alcohol use: Not on file  . Drug use: Not on file  . Sexual activity: Not on file  Other Topics Concern  . Not on file  Social History Narrative  . Not on file   Social Determinants of Health   Financial Resource Strain:   . Difficulty of Paying Living Expenses:   Food Insecurity:   . Worried About Programme researcher, broadcasting/film/video in the Last Year:   . Barista in the Last Year:   Transportation Needs:   . Freight forwarder (Medical):   Marland Kitchen Lack of Transportation (Non-Medical):   Physical Activity:   . Days of Exercise per Week:   . Minutes of Exercise per Session:   Stress:   . Feeling of Stress :   Social Connections:   . Frequency of Communication with Friends and Family:   . Frequency of Social Gatherings with Friends and Family:   . Attends Religious Services:   . Active Member of Clubs or Organizations:   . Attends Banker Meetings:   Marland Kitchen Marital Status:     Hospital Course: Patient was kept on 15-minute checks.  She showed no dangerous or aggressive behavior in the hospital.  She was cooperative and forthcoming with history and treatment planning.  Patient was treated with medication including a slight increase in sertraline dose and addition of Abilify.  Symptoms improved considerably during the hospital stay.  At discharge she denies suicidal ideation and denies any hallucinations.  She shows good insight and positive optimism towards the future and agrees to focus on safety and to notify family or other providers if symptoms worsen.  Patient will be discharged with prescriptions and follow-up with usual providers although she can look into alternative therapy or psychiatric treatment if desired.  Physical Findings: AIMS:  , ,  ,  ,    CIWA:    COWS:     Musculoskeletal: Strength & Muscle Tone: within normal limits Gait  & Station: normal Patient leans: N/A  Psychiatric Specialty Exam: Physical Exam  Nursing note and vitals reviewed. Constitutional: She appears well-developed and well-nourished.  HENT:  Head: Normocephalic and atraumatic.  Eyes: Pupils are equal, round, and reactive to light. Conjunctivae are normal.  Cardiovascular: Regular rhythm and normal heart sounds.  Respiratory: Effort normal. No respiratory distress.  GI: Soft.  Musculoskeletal:        General: Normal range of motion.     Cervical back: Normal range of motion.  Neurological: She is alert.  Skin: Skin is warm and dry.  Psychiatric: She has a normal mood and affect. Her speech is normal and behavior is normal. Judgment and thought content normal. Cognition and memory are normal.    Review of Systems  Constitutional: Negative.   HENT: Negative.   Eyes: Negative.   Respiratory: Negative.   Cardiovascular: Negative.   Gastrointestinal: Negative.   Musculoskeletal: Negative.   Skin: Negative.   Neurological: Negative.   Psychiatric/Behavioral: Negative.     Blood pressure 108/73, pulse 93, temperature 98.1 F (36.7 C), temperature source Oral, resp. rate 17, height 5\' 8"  (1.727 m), weight 50.8 kg, SpO2 95 %.Body mass index is 17.03 kg/m.  General Appearance: Casual  Eye Contact:  Good  Speech:  Clear and Coherent  Volume:  Normal  Mood:  Euthymic  Affect:  Congruent  Thought Process:  Goal Directed  Orientation:  Full (Time, Place, and Person)  Thought Content:  Logical  Suicidal Thoughts:  No  Homicidal Thoughts:  No  Memory:  Immediate;   Fair Recent;   Fair Remote;   Fair  Judgement:  Fair  Insight:  Fair  Psychomotor Activity:  Normal  Concentration:  Concentration: Fair  Recall:  Fair  Fund of Knowledge:  Fair  Language:  Fair  Akathisia:  No  Handed:  Right  AIMS (if indicated):     Assets:  Desire for Improvement Housing Physical Health Resilience Social Support  ADL's:  Intact  Cognition:   WNL  Sleep:  Number of Hours: 7        Has this patient used any form of tobacco in the last 30 days? (Cigarettes, Smokeless Tobacco, Cigars, and/or Pipes) Yes, No  Blood Alcohol level:  Lab Results  Component Value Date   ETH <10 02/07/2020    Metabolic Disorder Labs:  Lab Results  Component Value Date   HGBA1C 5.4 02/09/2020   MPG 108.28 02/09/2020   No results found for: PROLACTIN Lab Results  Component Value Date   CHOL 163 02/09/2020   TRIG 87 02/09/2020   HDL 59 02/09/2020   CHOLHDL 2.8 02/09/2020   VLDL 17 02/09/2020   LDLCALC 87 02/09/2020    See Psychiatric Specialty Exam and Suicide Risk Assessment completed by Attending Physician prior to discharge.  Discharge destination:  Home  Is patient on multiple antipsychotic therapies at discharge:  No  Has Patient had three or more failed trials of antipsychotic monotherapy by history:  No  Recommended Plan for Multiple Antipsychotic Therapies: NA  Discharge Instructions    Diet - low sodium heart healthy   Complete by: As directed    Increase activity slowly   Complete by: As directed      Allergies as of 02/11/2020      Reactions   Shellfish Allergy       Medication List    STOP taking these medications   amphetamine-dextroamphetamine 10 MG tablet Commonly known as: ADDERALL Replaced by: amphetamine-dextroamphetamine 5 MG tablet     TAKE these medications     Indication  amphetamine-dextroamphetamine 5 MG tablet Commonly known as: ADDERALL Take 2 tablets (10 mg total) by mouth daily with breakfast. Start taking on: Feb 12, 2020 Replaces: amphetamine-dextroamphetamine 10 MG tablet  Indication: Attention Deficit Hyperactivity Disorder   ARIPiprazole 5 MG tablet Commonly known as: ABILIFY Take 1 tablet (5 mg total) by mouth daily. Start taking on: Feb 12, 2020 What changed:   medication strength  how much to take  Indication: Major Depressive Disorder   busPIRone 30 MG tablet Commonly  known as: BUSPAR Take 1 tablet (30 mg total) by mouth 2 (two) times daily.  Indication: Anxiety Disorder, Major Depressive Disorder   estradiol 1 MG tablet Commonly known as: ESTRACE Take 1 mg by mouth daily.  Indication: Transgender therapy   norethindrone 0.35 MG tablet Commonly known as: MICRONOR Take 1 tablet (0.35 mg total) by mouth daily.  Indication: Continue progesterone for transgender therapy as prescribed at home   sertraline 100 MG tablet Commonly known as: ZOLOFT Take 1.5 tablets (150 mg total) by mouth daily. Start taking on: Feb 12, 2020 What changed: how much to take  Indication: Generalized Anxiety Disorder, Major Depressive Disorder      Follow-up [facility] Follow up.   Contact information: [address] [place]  [place], [place] [address] [phone]          Follow-up recommendations:  Activity:  Activity as tolerated Diet:  Regular diet Other:  Follow-up outpatient treatment as recommended  Comments: Prescriptions for mental health medications provided at discharge  Signed: Alethia Berthold, MD 02/11/2020, 11:37 AM

## 2020-02-11 NOTE — Progress Notes (Signed)
Pt pleasant during assessment denying SI/HI/AVH and pain. Patient endorses anxiety/depression stating it was from people in her life not accepting her transitioning to a male. Patient given education, support and encouragement to be active in her treatment plan. Patient didn't have any scheduled medications this evening and didn't request anything PRN. Patient being monitored Q 15 minutes for safety per unit protocol. Patient remains safe on the unit.  

## 2020-02-11 NOTE — Plan of Care (Signed)
Patient pleasant, compliant on the unit. Patient stated she was feeling better today.   Problem: Self-Concept: Goal: Level of anxiety will decrease Outcome: Progressing

## 2020-02-11 NOTE — Plan of Care (Signed)
Discharge Ready  Problem: Education: Goal: Knowledge of [facility] Education information/materials will improve Outcome: Adequate for Discharge Goal: Mental status will improve Outcome: Adequate for Discharge Goal: Verbalization of understanding the information provided will improve Outcome: Adequate for Discharge   Problem: Coping: Goal: Ability to verbalize frustrations and anger appropriately will improve Outcome: Adequate for Discharge Goal: Ability to demonstrate self-control will improve Outcome: Adequate for Discharge   Problem: Health Behavior/Discharge Planning: Goal: Identification of resources available to assist in meeting health care needs will improve Outcome: Adequate for Discharge Goal: Compliance with treatment plan for underlying cause of condition will improve Outcome: Adequate for Discharge   Problem: Education: Goal: Utilization of techniques to improve thought processes will improve Outcome: Adequate for Discharge Goal: Knowledge of the prescribed therapeutic regimen will improve Outcome: Adequate for Discharge   Problem: Coping: Goal: Coping ability will improve Outcome: Adequate for Discharge Goal: Will verbalize feelings Outcome: Adequate for Discharge   Problem: Health Behavior/Discharge Planning: Goal: Ability to make decisions will improve Outcome: Adequate for Discharge Goal: Compliance with therapeutic regimen will improve Outcome: Adequate for Discharge   Problem: Role Relationship: Goal: Will demonstrate positive changes in social behaviors and relationships Outcome: Adequate for Discharge   Problem: Self-Concept: Goal: Will verbalize positive feelings about self Outcome: Adequate for Discharge Goal: Level of anxiety will decrease Outcome: Adequate for Discharge

## 2020-02-14 NOTE — BHH Counselor (Signed)
At request of CSW Supervisor, CSW in entering this note despite patient's discharge from the hospital on 02/11/2020.   CSW called to confirm that patient was scheduled for her aftercare appointment. CSW was informed that patient was scheduled for 02/21/2020 at 9AM.    CSW initially called the patient's father to inform of the date and time of the appointment.  CSW was unable to locate the pt's contact information and consent was on file for father.  CSW notes that father appeared upset that pt wished to continue with this provider and that patient has requested the use of male pronouns when spoken to.  CSW was able to confirm the number for the patient.    CSW called the patient and confirmed that she was aware of the appointment date and time.  Patient reports that MH office had called and confirmed with her as well.  Penni Homans, MSW, LCSW 02/14/2020 3:02 PM

## 2020-02-21 ENCOUNTER — Encounter: Payer: Self-pay | Admitting: Family

## 2020-02-21 ENCOUNTER — Other Ambulatory Visit: Payer: Self-pay

## 2020-02-21 ENCOUNTER — Ambulatory Visit (INDEPENDENT_AMBULATORY_CARE_PROVIDER_SITE_OTHER): Payer: BC Managed Care – PPO | Admitting: Family

## 2020-02-21 VITALS — BP 110/64 | HR 78 | Resp 16 | Ht 65.35 in | Wt 117.6 lb

## 2020-02-21 DIAGNOSIS — F840 Autistic disorder: Secondary | ICD-10-CM

## 2020-02-21 DIAGNOSIS — R278 Other lack of coordination: Secondary | ICD-10-CM | POA: Diagnosis not present

## 2020-02-21 DIAGNOSIS — F640 Transsexualism: Secondary | ICD-10-CM | POA: Diagnosis not present

## 2020-02-21 DIAGNOSIS — F332 Major depressive disorder, recurrent severe without psychotic features: Secondary | ICD-10-CM

## 2020-02-21 DIAGNOSIS — Z719 Counseling, unspecified: Secondary | ICD-10-CM

## 2020-02-21 DIAGNOSIS — Z789 Other specified health status: Secondary | ICD-10-CM

## 2020-02-21 DIAGNOSIS — F411 Generalized anxiety disorder: Secondary | ICD-10-CM

## 2020-02-21 DIAGNOSIS — Z79899 Other long term (current) drug therapy: Secondary | ICD-10-CM

## 2020-02-21 DIAGNOSIS — F902 Attention-deficit hyperactivity disorder, combined type: Secondary | ICD-10-CM | POA: Diagnosis not present

## 2020-02-21 MED ORDER — AMPHETAMINE SULFATE 10 MG PO TABS
10.0000 mg | ORAL_TABLET | Freq: Every day | ORAL | 0 refills | Status: DC
Start: 2020-02-21 — End: 2020-05-11

## 2020-02-21 NOTE — Progress Notes (Signed)
[facility] [address] [place] [address] Dept: [phone] Dept Fax: [phone] Loc: [phone] Loc Fax: [phone]  Medication Check  Patient ID: Michael [name], adult  DOB: May 25, 1991, 29 y.o.  MRN: [id number]  Date of Evaluation: 02/21/2020  PCP: Wenda Low, MD  Accompanied by: self Patient Lives with: parents  HISTORY/CURRENT STATUS: HPI Patient here by himself for the visit today. Patient feeling better on Abilify with less suicidal ideations. Was recently admitted to [facility] with anxiety and suicidal ideations. Patient has also increased her Zoloft 100 mg daily to 1 1/2 tablets. No current reports of suicidal ideations, but still having some 'soft' voices and not sure if that is anxious thoughts. No side effects from medication changes and no adverse effects per patient. To look at reconnecting with counseling services at [facility] and meeting with school regarding academics for medical exception for Spring Semester with possible graduation pending this summer.   EDUCATION/WORK: School: [employer]  Year/Grade: college  Performance/ Grades: not sure about grades Services: IEP/504 Plan and Other: disability services Activities/ Exercise: rarely  MEDICAL HISTORY: Appetite: No changes reported by patient Getting a variety of food, no increased noted as of yet per patient.   Sleep: The same as previous, no changes reported by patient since last visit.  Concerns: Initiation/Maintenance/Other: None  Individual Medical History/ Review of Systems: Changes? :Yes, [place] for increased issues with suicidal ideations. Now taking Abilify 5 mg on a daily basis for suicidal thoughts and psychosis.   Allergies: Shellfish allergy  Current Medications  Current Outpatient Medications  Medication Instructions  . Amphetamine Sulfate (EVEKEO) 10 mg,  Oral, Daily  . ARIPiprazole (ABILIFY) 5 mg, Oral, Daily  . busPIRone (BUSPAR) 30 mg, Oral, 2 times daily  . estradiol (ESTRACE) 1 mg, Oral, Daily  . norethindrone (MICRONOR) 0.35 mg, Oral, Daily  . sertraline (ZOLOFT) 150 mg, Oral, Daily   Medication Side Effects: None   Family Medical/ Social History: Changes? None reported recently  MENTAL HEALTH: Mental Health Issues: Depression and Anxiety-now with change in counseling, insurance wont pay for recent counselor, so changing to [facility].   PHYSICAL EXAM; Vitals: Vitals:   02/21/20 0904  BP: 110/64  Pulse: 78  Resp: 16  Height: 5' 5.35" (1.66 m)  Weight: 117 lb 9.6 oz (53.3 kg)  BMI (Calculated): 19.36   General Physical Exam: Unchanged from previous exam, date:last f/u visit Changed:none today  Testing/Developmental Screens:  not completed today and discussed patient concerns  DIAGNOSES:    ICD-10-CM   1. ADHD (attention deficit hyperactivity disorder), combined type  F90.2   2. Generalized anxiety disorder  F41.1   3. Dysgraphia  R27.8   4. Male-to-male transgender person  F64.0   5. Severe episode of recurrent major depressive disorder, without psychotic features ([place])  F33.2   6. Autistic spectrum disorder  F84.0   7. Medication management  Z79.899   8. Patient counseled  Z71.9     RECOMMENDATIONS:  Counseling at this visit included the review of old records and/or current chart with the patient for recent admission to [facility], changes in medications, counseling, school updates with graduation and f/u with endocrine.  Discussed recent history and today's examination with patient with no changes since last examination.   Counseled regarding health and changes. Facility age limit for growth percentiles is 20 years.  Will continue to monitor.   Recommended a high protein, low sugar diet,  avoid sugary snacks and drinks, drink more water, eat more fruits and vegetables, increase daily exercise.  Encourage calorie dense  foods when hungry. Encourage snacks in the afternoon/evening. Add calories to food being consumed like switching to whole milk products, using instant breakfast type powders, increasing calories of foods with butter, sour cream, mayonnaise, cheese or ranch dressing. Can add potato flakes or powdered milk.   Discussed school academic and behavioral progress and advocated for appropriate accommodations as needed for learning environment.   Discussed importance of maintaining structure, routine, organization, reward, motivation and consequences with consistency with school and home settings.   Counseled medication pharmacokinetics, options, dosage, administration, desired effects, and possible side effects.   Zoloft 100 mg 1 1/2 tablets daily, no Rx today Discontinued Adderall prescribed by [facility], changed back to Evekeo 10 mg daily, # 30 with no RF's Buspar 30 mg BID, no Rx today Abilify as started by [facility], no Rx today-may need increase to 1 1/2-2 tablets RX for above e-scribed and sent to pharmacy on record  [id number] [name], [place] - [address] [place] [address] Phone: [phone] Fax: [phone]  Advised importance of:  Good sleep hygiene (8- 10 hours per night, no TV or video games for 1 hour before bedtime) Limited screen time (none on school nights, no more than 2 hours/day on weekends, use of screen time for motivation) Regular exercise(outside and active play) Healthy eating (drink water or milk, no sodas/sweet tea, limit portions and no seconds).  NEXT APPOINTMENT: Return in about 2 months (around 04/22/2020) for f/u visit.  Medical Decision-making: More than 50% of the appointment was spent counseling and discussing diagnosis and management of symptoms with the patient and family.  Carron Curie, NP Counseling Time: 40 mins Total Contact Time: 45 mins

## 2020-02-21 NOTE — Patient Instructions (Signed)
[facility] [address] [place], [place] [address] All Evaluations, please email [email or web address] or call [phone]

## 2020-02-22 ENCOUNTER — Other Ambulatory Visit: Payer: Self-pay

## 2020-02-24 ENCOUNTER — Other Ambulatory Visit: Payer: Self-pay

## 2020-02-24 ENCOUNTER — Ambulatory Visit (INDEPENDENT_AMBULATORY_CARE_PROVIDER_SITE_OTHER): Payer: BC Managed Care – PPO | Admitting: Endocrinology

## 2020-02-24 ENCOUNTER — Encounter: Payer: Self-pay | Admitting: Endocrinology

## 2020-02-24 VITALS — BP 104/62 | HR 95 | Ht 65.35 in | Wt 123.0 lb

## 2020-02-24 DIAGNOSIS — F640 Transsexualism: Secondary | ICD-10-CM

## 2020-02-24 DIAGNOSIS — Z789 Other specified health status: Secondary | ICD-10-CM

## 2020-02-24 MED ORDER — ELAGOLIX SODIUM 150 MG PO TABS: 150.0000 mg | ORAL_TABLET | Freq: Every day | ORAL | 11 refills | Status: DC

## 2020-02-24 NOTE — Progress Notes (Signed)
Subjective:    Patient ID: Michael Krise Treadwell, adult    DOB: 11-09-1990, 29 y.o.   MRN: [id number]  HPI Pt returns for f/u of transgender state (M to F).   Surgery: never Medication: E2 (since 2020), and norethindrone (since 2021) Counseling: goes to [facility] Other: She has no risk factors for E2 rx SDOH: pt's father has questioned transgender dx.   Interval hx: pt says since on 2 meds, she reports some breast growth.  She feels as though this helps dysphoria.   Past Medical History:  Diagnosis Date  . ADHD (attention deficit hyperactivity disorder)     No past surgical history on file.  Social History   Socioeconomic History  . Marital status: Single    Spouse name: Not on file  . Number of children: Not on file  . Years of education: Not on file  . Highest education level: Not on file  Occupational History  . Not on file  Tobacco Use  . Smoking status: Never Smoker  . Smokeless tobacco: Never Used  Substance and Sexual Activity  . Alcohol use: Not on file  . Drug use: Not on file  . Sexual activity: Not on file  Other Topics Concern  . Not on file  Social History Narrative  . Not on file   Social Determinants of Health   Financial Resource Strain:   . Difficulty of Paying Living Expenses:   Food Insecurity:   . Worried About Charity fundraiser in the Last Year:   . Arboriculturist in the Last Year:   Transportation Needs:   . Film/video editor (Medical):   Marland Kitchen Lack of Transportation (Non-Medical):   Physical Activity:   . Days of Exercise per Week:   . Minutes of Exercise per Session:   Stress:   . Feeling of Stress :   Social Connections:   . Frequency of Communication with Friends and Family:   . Frequency of Social Gatherings with Friends and Family:   . Attends Religious Services:   . Active Member of Clubs or Organizations:   . Attends Archivist Meetings:   Marland Kitchen Marital Status:   Intimate Partner Violence:   . Fear of Current or  Ex-Partner:   . Emotionally Abused:   Marland Kitchen Physically Abused:   . Sexually Abused:     Current Outpatient Medications on File Prior to Visit  Medication Sig Dispense Refill  . Amphetamine Sulfate (EVEKEO) 10 MG TABS Take 10 mg by mouth daily. 30 tablet 0  . ARIPiprazole (ABILIFY) 5 MG tablet Take 1 tablet (5 mg total) by mouth daily. 30 tablet 1  . busPIRone (BUSPAR) 30 MG tablet Take 1 tablet (30 mg total) by mouth 2 (two) times daily. 60 tablet 1  . estradiol (ESTRACE) 1 MG tablet Take 1 mg by mouth daily.    . norethindrone (MICRONOR) 0.35 MG tablet Take 1 tablet (0.35 mg total) by mouth daily. 3 Package 0  . sertraline (ZOLOFT) 100 MG tablet Take 1.5 tablets (150 mg total) by mouth daily. 45 tablet 1   No current facility-administered medications on file prior to visit.    Allergies  Allergen Reactions  . Shellfish Allergy     Family History  Problem Relation Age of Onset  . Asthma Mother   . Depression Mother   . Learning disabilities Mother   . ADD / ADHD Father   . Other Father        low testosterone  .  Speech disorder Brother     BP 104/62   Pulse 95   Ht 5' 5.35" (1.66 m)   Wt 123 lb (55.8 kg)   SpO2 98%   BMI 20.25 kg/m    Review of Systems Denies calf pain.      Objective:   Physical Exam VITAL SIGNS:  See vs page GENERAL: no distress Ext: no leg edema.        Assessment & Plan:  Transgender state: I told pt we'll increase E2 if we can, based on lab result  Patient Instructions  Blood tests are requested for you today.  We'll let you know about the results.   Based on the results, we'll increase the estrogen if we can.  Also, I have sent a prescription to your pharmacy, for another pill.   Please see a transgender specialist, at [facility].  you will receive a phone call, about a day and time for an appointment Please come back for a follow-up appointment in 6 months.

## 2020-02-24 NOTE — Patient Instructions (Addendum)
Blood tests are requested for you today.  We'll let you know about the results.   Based on the results, we'll increase the estrogen if we can.  Also, I have sent a prescription to your pharmacy, for another pill.   Please see a transgender specialist, at [facility].  you will receive a phone call, about a day and time for an appointment Please come back for a follow-up appointment in 6 months.

## 2020-02-27 ENCOUNTER — Telehealth (HOSPITAL_COMMUNITY): Payer: Self-pay | Admitting: Professional

## 2020-02-27 ENCOUNTER — Telehealth (HOSPITAL_COMMUNITY): Payer: Self-pay | Admitting: Psychiatry

## 2020-02-27 NOTE — Telephone Encounter (Signed)
D:  Pt phoned inquiring about MH-IOP.  Admits to recent discharge from [facility].  A:  Discussed PHP with pt.  Inform PHP staff to give pt a call.  R:  Pt receptive.

## 2020-02-28 ENCOUNTER — Telehealth: Payer: Self-pay

## 2020-02-28 ENCOUNTER — Other Ambulatory Visit: Payer: Self-pay | Admitting: Endocrinology

## 2020-02-28 NOTE — Telephone Encounter (Signed)
Please review and advise.

## 2020-02-28 NOTE — Telephone Encounter (Signed)
Patient calling stating she needs estradiol refill called [facility] the [facility]  [facility] [address] [name], [place] - [address] [phone] (Phone) [phone] (Fax)

## 2020-02-28 NOTE — Telephone Encounter (Signed)
This request remains on hold pending the results of his hormone levels. Pt will receive a call about his labs as well as any new orders once reviewed and addressed by Dr. Everardo All. Will continue to await results and orders. For now, no action required.

## 2020-02-28 NOTE — Telephone Encounter (Signed)
Still waiting for lab result

## 2020-02-28 NOTE — Telephone Encounter (Signed)
PRIOR AUTHORIZATION  PA initiation date: 02/28/20  Medication: Michael [name] 150mg  Insurance Company: completed electronically through [employer] My Meds: Yes  Will await insurance response re: approval/denial.  Ashely Zupko (Key: [id number])  Your information has been submitted to [employer] [place]. [employer] [place] will review the request and notify you of the determination decision directly, typically within 72 hours of receiving all information.  You will also receive your request decision electronically. To check for an update later, open this request again from your dashboard.  If [employer] [place] has not responded within the specified timeframe or if you have any questions about your PA submission, contact Blue Cross [place] directly at [phone].  Stephfon Raatz [id number] - Rx #Fredrik [place] Need help? Call : [phone] at [phone] Status Sent to [facility] [phone] 150MG  tablets Form Blue Cross [place] Commercial Electronic Request Form (CB) Original Claim Info 75

## 2020-02-28 NOTE — Telephone Encounter (Signed)
Please advise 

## 2020-02-28 NOTE — Telephone Encounter (Signed)
DENIAL  Medication: [employer]: [employer] PA response: Denied Rationale: Does not meet the definition of Medical Necessity in the member's benefit booklet  Document has been given to Dr. Everardo All for him to review and address. Will await his response.

## 2020-02-29 ENCOUNTER — Telehealth (HOSPITAL_COMMUNITY): Payer: Self-pay | Admitting: Professional

## 2020-02-29 LAB — TESTOSTERONE,FREE AND TOTAL
Testosterone, Free: 4.3 pg/mL — ABNORMAL LOW (ref 9.3–26.5)
Testosterone: 289 ng/dL (ref 264–916)

## 2020-02-29 NOTE — Telephone Encounter (Signed)
You could buy with [facility], but I believe it is expensive. If I was you, I would ask at [facility] when you go there

## 2020-02-29 NOTE — Telephone Encounter (Signed)
Denial letter returned with Dr. George Hugh signature. No further instructions received. Routing this message to Dr. Everardo All to determine if pt is to purchase WITHOUT insurance and WITH use of [facility]. Will await his response.

## 2020-02-29 NOTE — Telephone Encounter (Signed)
Called pt and informed about Dr. George Hugh response re: Michael [name]. Also informed that we are not able to refill Estradiol WITHOUT first knowing the lab results. Advised a call will be placed once we have received the results, they have been reviewed and about any new orders associated with the results. Verbalized acceptance and understanding.

## 2020-03-01 ENCOUNTER — Other Ambulatory Visit: Payer: Self-pay

## 2020-03-01 DIAGNOSIS — Z789 Other specified health status: Secondary | ICD-10-CM

## 2020-03-01 LAB — ESTRADIOL, FREE
Estradiol, Free: 1.45 pg/mL — ABNORMAL HIGH
Estradiol: 58 pg/mL — ABNORMAL HIGH

## 2020-03-01 MED ORDER — ESTRADIOL 1 MG PO TABS: 1.0000 mg | ORAL_TABLET | Freq: Every day | ORAL | 0 refills | Status: DC

## 2020-03-01 NOTE — Telephone Encounter (Signed)
please refill x 1  

## 2020-03-01 NOTE — Telephone Encounter (Signed)
Please review pt request and advise 

## 2020-03-01 NOTE — Telephone Encounter (Signed)
Patient called and is requesting a temporary RX of [facility]adiol [facility] [address] J[place] tuntil the new lab results are received.  Please call patient to advise [phone]

## 2020-03-01 NOTE — Telephone Encounter (Signed)
Outpatient Medication Detail   Disp Refills Start End   estradiol (ESTRACE) 1 MG tablet 30 tablet 0 03/01/2020    Sig - Route: Take 1 tablet (1 mg total) by mouth daily. - Oral   Sent to pharmacy as: estradiol (ESTRACE) 1 MG tablet   E-Prescribing Status: Receipt confirmed by pharmacy (03/01/2020  3:29 PM EDT)    Above orders sent per Dr. George Hugh request

## 2020-03-02 ENCOUNTER — Other Ambulatory Visit: Payer: Self-pay | Admitting: Endocrinology

## 2020-03-02 ENCOUNTER — Telehealth: Payer: Self-pay

## 2020-03-02 MED ORDER — ESTRADIOL 2 MG PO TABS: 2.0000 mg | ORAL_TABLET | Freq: Every day | ORAL | 3 refills | Status: DC

## 2020-03-02 NOTE — Telephone Encounter (Signed)
-----   Message from Romero Belling, MD sent at 03/02/2020  7:14 AM EDT ----- please contact patient: Testosterone is too high, and estrogen too low.  I have sent a prescription to your pharmacy, to double the estradiol.  I hope this helps.

## 2020-03-02 NOTE — Telephone Encounter (Signed)
LAB RESULTS  Lab results were reviewed by Dr. Ellison. Called pt to inform about lab results as well as new orders. Using closed-loop communication, pt verbalized complete acceptance and understanding of all information provided. No further questions nor concerns were voiced at this time. 

## 2020-03-07 ENCOUNTER — Telehealth (HOSPITAL_COMMUNITY): Payer: Self-pay | Admitting: Professional

## 2020-03-07 ENCOUNTER — Other Ambulatory Visit (HOSPITAL_COMMUNITY): Payer: BC Managed Care – PPO

## 2020-03-07 ENCOUNTER — Other Ambulatory Visit: Payer: Self-pay

## 2020-03-08 ENCOUNTER — Other Ambulatory Visit: Payer: Self-pay

## 2020-03-08 ENCOUNTER — Other Ambulatory Visit (HOSPITAL_COMMUNITY): Payer: BC Managed Care – PPO | Attending: Psychiatry | Admitting: Licensed Clinical Social Worker

## 2020-03-08 DIAGNOSIS — F902 Attention-deficit hyperactivity disorder, combined type: Secondary | ICD-10-CM | POA: Insufficient documentation

## 2020-03-08 DIAGNOSIS — F840 Autistic disorder: Secondary | ICD-10-CM | POA: Diagnosis not present

## 2020-03-08 DIAGNOSIS — Z789 Other specified health status: Secondary | ICD-10-CM

## 2020-03-08 DIAGNOSIS — F332 Major depressive disorder, recurrent severe without psychotic features: Secondary | ICD-10-CM | POA: Insufficient documentation

## 2020-03-08 DIAGNOSIS — F411 Generalized anxiety disorder: Secondary | ICD-10-CM | POA: Insufficient documentation

## 2020-03-08 DIAGNOSIS — F640 Transsexualism: Secondary | ICD-10-CM | POA: Diagnosis not present

## 2020-03-11 ENCOUNTER — Encounter (HOSPITAL_COMMUNITY): Payer: Self-pay

## 2020-03-11 DIAGNOSIS — F332 Major depressive disorder, recurrent severe without psychotic features: Secondary | ICD-10-CM

## 2020-03-13 ENCOUNTER — Other Ambulatory Visit (HOSPITAL_COMMUNITY): Payer: BC Managed Care – PPO | Admitting: Occupational Therapy

## 2020-03-13 ENCOUNTER — Other Ambulatory Visit (HOSPITAL_COMMUNITY): Payer: BC Managed Care – PPO | Attending: Psychiatry | Admitting: Licensed Clinical Social Worker

## 2020-03-13 ENCOUNTER — Encounter (HOSPITAL_COMMUNITY): Payer: Self-pay

## 2020-03-13 ENCOUNTER — Other Ambulatory Visit: Payer: Self-pay

## 2020-03-13 DIAGNOSIS — Z818 Family history of other mental and behavioral disorders: Secondary | ICD-10-CM | POA: Insufficient documentation

## 2020-03-13 DIAGNOSIS — F411 Generalized anxiety disorder: Secondary | ICD-10-CM

## 2020-03-13 DIAGNOSIS — R440 Auditory hallucinations: Secondary | ICD-10-CM | POA: Diagnosis not present

## 2020-03-13 DIAGNOSIS — Z91013 Allergy to seafood: Secondary | ICD-10-CM | POA: Insufficient documentation

## 2020-03-13 DIAGNOSIS — F909 Attention-deficit hyperactivity disorder, unspecified type: Secondary | ICD-10-CM | POA: Insufficient documentation

## 2020-03-13 DIAGNOSIS — F332 Major depressive disorder, recurrent severe without psychotic features: Secondary | ICD-10-CM

## 2020-03-13 DIAGNOSIS — Z79899 Other long term (current) drug therapy: Secondary | ICD-10-CM | POA: Insufficient documentation

## 2020-03-13 DIAGNOSIS — F840 Autistic disorder: Secondary | ICD-10-CM | POA: Diagnosis not present

## 2020-03-13 DIAGNOSIS — F640 Transsexualism: Secondary | ICD-10-CM | POA: Insufficient documentation

## 2020-03-13 DIAGNOSIS — Z825 Family history of asthma and other chronic lower respiratory diseases: Secondary | ICD-10-CM | POA: Diagnosis not present

## 2020-03-13 DIAGNOSIS — R4589 Other symptoms and signs involving emotional state: Secondary | ICD-10-CM

## 2020-03-13 DIAGNOSIS — R41844 Frontal lobe and executive function deficit: Secondary | ICD-10-CM

## 2020-03-13 NOTE — BH Assessment (Signed)
Q-actual 2-wk PHQ 2-9   PHQ 2 is 3 PHQ 9 is 2  Participant reports that she has been forgetting to complete the [employer].  Writer reminded the patient of the feature on the app to send her reminders.    The last time a Wellness Check was completed was on 02-13-2020.
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
## 2020-03-13 NOTE — Therapy (Addendum)
[facility] PARTIAL HOSPITALIZATION PROGRAM [address] [place], [place], [address] Phone: [phone]   Fax:  [phone] Virtual Visit via Video Note  I connected with Michael [name] on 03/13/20 at  11:00 AM EDT by a video enabled telemedicine application and verified that I am speaking with the correct person using two identifiers.   I discussed the limitations of evaluation and management by telemedicine and the availability of in person appointments. The patient expressed understanding and agreed to proceed.   I discussed the assessment and treatment plan with the patient. The patient was provided an opportunity to ask questions and all were answered. The patient agreed with the plan and demonstrated an understanding of the instructions.   The patient was advised to call back or seek an in-person evaluation if the symptoms worsen or if the condition fails to improve as anticipated.  I provided 90 minutes of non-face-to-face time during this encounter.    Occupational Therapy Evaluation  Patient Details  Name: Michael [name] MRN: [id number] Date of Birth: 1990/11/17 Referring Provider (OT): Hillery Jacks   Encounter Date: 03/13/2020  OT End of Session - 03/13/20 1421    Visit Number  1    Number of Visits  20    Date for OT Re-Evaluation  04/10/20    Authorization Type  [employer]    OT Start Time  1105    OT Stop Time  1205   9-930 OT Eval   OT Time Calculation (min)  60 min    Activity Tolerance  Patient tolerated treatment well    Behavior During Therapy  [facility] for tasks assessed/performed       Past Medical History:  Diagnosis Date  . ADHD (attention deficit hyperactivity disorder)     History reviewed. No pertinent surgical history.  There were no vitals filed for this visit.  Subjective Assessment - 03/13/20 1418    Currently in Pain?  No/denies    Pain Score  0-No pain        [facility] OT Assessment - 03/13/20 0001      Assessment   Medical Diagnosis  Major Depression    Referring Provider (OT)  Hillery Jacks      Precautions   Precautions  None      Balance Screen   Has the patient fallen in the past 6 months  No    Has the patient had a decrease in activity level because of a fear of falling?   No    Is the patient reluctant to leave their home because of a fear of falling?   No          OT Education - 03/13/20 1418    Education Details  Educated on OT within [facility] programming and sleep hygiene and strategies to improve overall sleep quality    Person(s) Educated  Patient    Methods  Explanation;Handout    Comprehension  Verbalized understanding       OT Short Term Goals - 03/13/20 1425      OT SHORT TERM GOAL #1   Title  Pt will actively engage in OT group sessions throughout duration of PHP programming, in order to promote daily structure, social engagement, and opportunities to develop and utilize adaptive strategies to maximize functional performance in preparation for safe transition and integration back into school, work, and the community.    Time  4    Period  Weeks    Status  New  Target Date  04/10/20      OT SHORT TERM GOAL #2   Title  Pt will identify 1-3 strategies to increase social participation, in order to promote healthy socialization and community reintegration, in preparation for discharge.    Status  New      OT SHORT TERM GOAL #3   Title  Pt will practice and identify 1-3 adaptive coping strategies she can utilize, in order to safely manage increased depression/anxiety, with min cues, in preparation for safe and healthy reintegration back into the community at discharge.    Status  New      OT SHORT TERM GOAL #4   Title  Pt will identify 1-3 sleep hygiene strategies he can utilize, in order to improve sleep quality/ADL performance, in preparation for safe and healthy reintegration back into the community at discharge.    Status  New     Group Session:  S: "I get  up really early. I'm stuck on ward time."  O: Today's group discussion focused on topic of Sleep Hygiene. Patients reflected on the quality of sleep they typically receive and identified areas that need improvement. Group was given background information on sleep and sleep hygiene, including common sleep disorders. Group members also received information on how to improve one's sleep and introduced a sleep diary as a tool that can be utilized to track sleep quality over a length of time. Group session ended with patients identifying one or more strategies they could utilize or implement into their sleep routine in order to improve overall sleep quality.    A: [name] was active in her participation of discussion. Identified being stuck on "ward time" and shared that when she was inpatient she would get woken up at 5 am and has still not gotten into her own routine since coming home. Pt shared that she has difficulty getting up really early and identified interest in "coming up with a routine" as a sleep hygiene strategy she would like to try and implement. Appeared both interested and receptive to information received on sleep hygiene.  P: Continue to attend PHP OT group sessions 5x week for 4 weeks to promote daily structure, social engagement, and opportunities to develop and utilize adaptive strategies to maximize functional performance in preparation for safe transition and integration back into school, work, and the community. Plan to address topic of communication styles in next OT group session.   OT Assessment:  Diagnosis: Major Depression Past medical history/referral information: Pt has a dx of major depression and autism spectrum disorder (ASD). Referral from [facility]  Living situation: Pt lives at home with her parents and brother. ADLs: Pt appears relatively clean and groomed, though identified difficulty with ADL performance in the past Work: Pt does not currently work, though recently completed  and graduated college. Pt is looking to continue her education and pursue a Master's degree in criminology and sociology.  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  Social support: Seeks support from him online community, though did identify a friend whom he spends time out with in the physical community. Struggles: Pt identified his biggest current struggle is "managing my RSD" which he identified as "rejection sensitive dysphoria" and making friends/being part of a community.  OT Goals: See below  OCAIRS Mental Health Interview Summary of Client Scores:  Facilitates participation in occupation Allows participation in occupation Inhibits participation in occupation Restricts participation in  occupation Comments:  Roles   X  Friend, daughter, student, Marketing executive, Teacher, early years/pre  Habits   X  Wakes up, takes hormones, dog for a walk, engages in leisure interests online and communicates with online community  Personal Causation   X  Recently graduated college and can learn multiple different languages quickly  [employer], compassion, knowledge  Interests   X  Linguistics, Norse, Theme park manager, Musician   X    Short-Term Goals   X  Identifies benefit from "If then" statements as short-term goals to structure the day  Long-term Goals    X Does not identify any LTG  Interpretation of Past Experiences   X  Identifies having SI as worst period of her life, though notes being "privileged" growing up as a "white male". Pt now identifies as male she/her.   Physical Environment  X     Social Environment   X  Pt lives with parents and brother. Identifies strong support from online community, though notes recently being excluded  Readiness for Change   X      Need for Occupational Therapy:  4 Shows positive occupational participation, no need for OT.   3 Need for  minimal intervention/consultative participation  X 2 Need for OT intervention indicated to restore/improve participation   1 Need for extensive OT intervention indicated to improve participation.  Referral for follow up services also recommended.    Assessment:  Patient demonstrates behavior that inhibits participation in occupation. Pt will benefit from skilled occupational therapy services in order to address current difficulties with emotion regulation, socialization, stress management, time management, job readiness, financial wellness, health and nutrition, sleep hygiene, and leisure participation, in preparation for reintegration and return to community at discharge.   Plan:  Patient will participate in skilled occupational therapy sessions (group or individual) to promote daily structure, social engagement, and opportunities to develop and utilize adaptive strategies to maximize functional performance in preparation for safe transition and integration back into school, work, and/or the community. OT sessions with occur 4-5 x per week for 3-4 weeks.    Plan - 03/13/20 1423    OT Occupational Profile and History  Problem Focused Assessment - Including review of records relating to presenting problem    Occupational performance deficits (Please refer to evaluation for details):  ADL's;IADL's;Rest and Sleep;Education;Leisure;Social Participation    Body Structure / Function / Physical Skills  ADL    Cognitive Skills  Attention;Emotional;Energy/Drive;Problem Solve;Safety Awareness;Thought    Psychosocial Skills  Coping Strategies;Habits;Routines and Behaviors;Interpersonal Interaction    Rehab Potential  Good    Clinical Decision Making  Limited treatment options, no task modification necessary    Comorbidities Affecting Occupational Performance:  May have comorbidities impacting occupational performance    Modification or Assistance to Complete Evaluation   No modification of tasks or assist  necessary to complete eval    OT Frequency  5x / week    OT Duration  4 weeks    OT Treatment/Interventions  Self-care/ADL training;Coping strategies training;Psychosocial skills training    Consulted and Agree with Plan of Care  Patient       Patient will benefit from skilled therapeutic intervention in order to improve the following deficits and impairments:   Body Structure / Function / Physical Skills: ADL Cognitive Skills: Attention, Emotional, Energy/Drive, Problem Solve, Safety Awareness, Thought Psychosocial Skills: Coping Strategies, Habits, Routines and Behaviors, Interpersonal Interaction   Visit Diagnosis: Difficulty coping  Frontal lobe and executive  function deficit  Severe episode of recurrent major depressive disorder, without psychotic features [facility])    Problem List Patient Active Problem List   Diagnosis Date Noted  . Autistic spectrum disorder 02/11/2020  . MDD (major depressive disorder), recurrent episode, severe ([place]) 02/08/2020  . MDD (major depressive disorder), recurrent, severe, with psychosis ([place]) 02/07/2020  . 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 ADHD (attention deficit hyperactivity disorder), combined type 01/25/2016  . Generalized anxiety disorder 01/25/2016  . Dysgraphia 01/25/2016    Ponciano Ort, MOT, OTR/L  03/13/2020, 2:31 PM  [facility] HOSPITALIZATION PROGRAM [place], [place], [address] Phone: [phone]   Fax:  [phone]  Name: SADRAC ZEOLI MRN: [id number] Date of Birth: [birth year]

## 2020-03-13 NOTE — Psych (Signed)
Virtual Visit via Video Note  I connected with Michael [name] on 03/13/20 at  9:00 AM EDT by a video enabled telemedicine application and verified that I am speaking with the correct person using two identifiers.   I discussed the limitations of evaluation and management by telemedicine and the availability of in person appointments. The patient expressed understanding and agreed to proceed.  Location: Patient: Patient Home Provider: Clinical Home Office   Follow Up Instructions:    I discussed the assessment and treatment plan with the patient. The patient was provided an opportunity to ask questions and all were answered. The patient agreed with the plan and demonstrated an understanding of the instructions.   The patient was advised to call back or seek an in-person evaluation if the symptoms worsen or if the condition fails to improve as anticipated.  I provided 10 minutes of non-face-to-face time during this encounter.  Patient verbally agrees to treatment plan  [name], [facility]

## 2020-03-14 ENCOUNTER — Other Ambulatory Visit: Payer: Self-pay

## 2020-03-14 ENCOUNTER — Encounter (HOSPITAL_COMMUNITY): Payer: Self-pay

## 2020-03-14 ENCOUNTER — Other Ambulatory Visit (HOSPITAL_COMMUNITY): Payer: BC Managed Care – PPO | Admitting: Licensed Clinical Social Worker

## 2020-03-14 ENCOUNTER — Other Ambulatory Visit (HOSPITAL_COMMUNITY): Payer: BC Managed Care – PPO | Admitting: Occupational Therapy

## 2020-03-14 DIAGNOSIS — F332 Major depressive disorder, recurrent severe without psychotic features: Secondary | ICD-10-CM | POA: Diagnosis not present

## 2020-03-14 DIAGNOSIS — R4589 Other symptoms and signs involving emotional state: Secondary | ICD-10-CM

## 2020-03-14 DIAGNOSIS — R41844 Frontal lobe and executive function deficit: Secondary | ICD-10-CM

## 2020-03-14 NOTE — Therapy (Signed)
[place], [place], [address] Phone: [phone]   Fax:  [phone] Virtual Visit via Video Note  I connected with Michael [name] on 03/14/20 at  10:00 AM EDT by a video enabled telemedicine application and verified that I am speaking with the correct person using two identifiers.   I discussed the limitations of evaluation and management by telemedicine and the availability of in person appointments. The patient expressed understanding and agreed to proceed.   I discussed the assessment and treatment plan with the patient. The patient was provided an opportunity to ask questions and all were answered. The patient agreed with the plan and demonstrated an understanding of the instructions.   The patient was advised to call back or seek an in-person evaluation if the symptoms worsen or if the condition fails to improve as anticipated.  I provided 60 minutes of non-face-to-face time during this encounter.    Occupational Therapy Treatment  Patient Details  Name: Michael [name] MRN: [id number] Date of Birth: [birth year] Referring Provider (OT): Ricky Ala   Encounter Date: 03/14/2020  OT End of Session - 03/14/20 1425    Visit Number  2    Number of Visits  20    Date for OT Re-Evaluation  04/10/20    Authorization Type  [employer]BS    OT Start Time  1000    OT Stop Time  1100    OT Time Calculation (min)  60 min    Activity Tolerance  Patient tolerated treatment well    Behavior During Therapy  [facility] for tasks assessed/performed       Past Medical History:  Diagnosis Date  . ADHD (attention deficit hyperactivity disorder)     History reviewed. No pertinent surgical history.  There were no vitals filed for this visit.  Subjective Assessment - 03/14/20 1425    Currently in Pain?  No/denies    Pain Score  0-No pain     Group Session:  S: "I am pretty direct when I  communicate. It's mainly because of the autism."  O: Group began with a reflection from previous OT session on sleep hygiene. Group members shared their reflections and progress on whether or not they implemented a new sleep hygiene tip or strategy into their routine. Today's group focused on topic of Communication Styles. Group members were educated on the different styles including passive, aggressive, and assertive communication. Members shared and reflected on which style they most often find themselves communicating in and how to transition to a more assertive approach. Benefits and drawbacks of each communication style were discussed and use of the XYZ assertive communication tool was introduced. The XYZ communication tool states: I feel X when you do Y in situation Z and I would like _________. X is the emotion, Y is the specific behavior, and Z is the specific situation. Group members each formulated their own XYZ statement and shared with the group to discuss and offer feedback.    A: [name] was moderately engaged in group discussion and appeared receptive to min cues for active engagement. She shared that she does not have difficulty with her sleep, therefore has not changed or implemented any sleep hygiene strategies. She did mention desire to get back into a routine later than 5 am, however has not yet done so. Pt identified having a "direct" communication style and attributed this to her diagnosis of autism. Pt identified an XYZ statement "I feel  annoyed when you come home drunk late at night and I would like you to stop." Pt encouraged to reflect on identifying additional needs or suggestions to implement in place of "stop". Appeared receptive to education and feedback received.  P: Continue to attend PHP OT group sessions 5x week for 4 weeks to promote daily structure, social engagement, and opportunities to develop and utilize adaptive strategies to maximize functional performance in preparation  for safe transition and integration back into school, work, and the community. Plan to address topic of assertive communication and active listening in next OT group session.     OT Education - 03/14/20 1425    Education Details  Educated on communication styles and use of XYZ assertive communication tool    Person(s) Educated  Patient    Methods  Explanation    Comprehension  Verbalized understanding       OT Short Term Goals - 03/14/20 1426      OT SHORT TERM GOAL #1   Status  On-going      OT SHORT TERM GOAL #2   Status  On-going      OT SHORT TERM GOAL #3   Status  On-going      OT SHORT TERM GOAL #4   Status  On-going               Plan - 03/14/20 1426    Occupational performance deficits (Please refer to evaluation for details):  ADL's;IADL's;Rest and Sleep;Education;Leisure;Social Participation    Body Structure / Function / Physical Skills  ADL    Cognitive Skills  Attention;Emotional;Energy/Drive;Problem Solve;Safety Awareness;Thought    Psychosocial Skills  Coping Strategies;Habits;Routines and Behaviors;Interpersonal Interaction       Patient will benefit from skilled therapeutic intervention in order to improve the following deficits and impairments:   Body Structure / Function / Physical Skills: ADL Cognitive Skills: Attention, Emotional, Energy/Drive, Problem Solve, Safety Awareness, Thought Psychosocial Skills: Coping Strategies, Habits, Routines and Behaviors, Interpersonal Interaction   Visit Diagnosis: Difficulty coping  Frontal lobe and executive function deficit  Severe episode of recurrent major depressive disorder, without psychotic features [facility])    Problem List Patient Active Problem List   Diagnosis Date Noted  . Autistic spectrum disorder 02/11/2020  . MDD (major depressive disorder), recurrent episode, severe ([facility]) 02/08/2020  . MDD (major depressive disorder), recurrent, severe, with psychosis ([facility]) 02/07/2020  .  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 ADHD (attention deficit hyperactivity disorder), combined type 01/25/2016  . Generalized anxiety disorder 01/25/2016  . Dysgraphia 01/25/2016    Michael [name], MOT, OTR/L  03/14/2020, 2:28 PM  [facility] HOSPITALIZATION PROGRAM [address] [place], [place], [address] Phone: [phone]   Fax:  [phone]  Name: KHYLE GOODELL MRN: [id number] Date of Birth: [birth year]

## 2020-03-14 NOTE — Progress Notes (Signed)
Virtual Visit via Video Note  I connected with Michael [name] on 03/14/20 at  9:00 AM EDT by a video enabled telemedicine application and verified that I am speaking with the correct person using two identifiers.   I discussed the limitations of evaluation and management by telemedicine and the availability of in person appointments. The patient expressed understanding and agreed to proceed.   I discussed the assessment and treatment plan with the patient. The patient was provided an opportunity to ask questions and all were answered. The patient agreed with the plan and demonstrated an understanding of the instructions.   The patient was advised to call back or seek an in-person evaluation if the symptoms worsen or if the condition fails to improve as anticipated.  I provided 15 minutes of non-face-to-face time during this encounter.   Oneta Rack, NP    Behavioral Health Partial Program Assessment Note  Date: 03/14/2020 Name: Michael [name] MRN: [id number]   HPI: Michael [name] is a 29 y.o. Caucasian adult presents with worsening depression and anxiety after recent inpatient admission at [facility]. (Transgender- male pronouns).  Reports struggling with multiple stressors in past chronic ideations of thoughts of death.  Denies plan or intent.  Reports intermittent auditory hallucinations.  Currently denying hearing voices or sounds at this time.  Denies suicidal or homicidal ideations.  Denies auditory or visual hallucinations.  Patient reports he is currently followed by therapist and a psychiatrist Florence Canner where he is prescribed Zoloft, BuSpar for his ADHD and depression.  Denies history of substance abuse with alcohol or illicit drug use.  Patient was enrolled in partial psychiatric program on 03/14/20.  Per discharge summary notes from [facility] :  Patient seen and chart reviewed.  29 year old patient with a history of anxiety and  depression came to the hospital with reports of auditory hallucinations and suicidal ideation.  On interview today she states that she is no longer feeling acutely suicidal. Hallucinations have diminished somewhat.  Patient is already seeing a therapist and on medication and is agreeable and insightful about treatment.  Primary complaints include: agitation, depression worse and feeling depressed.  Onset of symptoms was gradual with gradually worsening course since that time. Psychosocial Stressors include the following: family and financial.  Reported family history of mental illness.  As he states his mother and brother both diagnosed with dyslexia,depression and dysgraphia.  Patient reported his brother has epilepsy.  Denies history of sexual or verbal abuse.  I have reviewed the following documentation dated 03/15/2020 past psychiatric history, past medical history and past social and family history  Complaints of Pain: nonear Past Psychiatric History:  Past psychiatric hospitalizations  and Past medication trials  Currently in treatment with Buspar, Zoloft and Abilify    Substance Abuse History: none Use of Alcohol: denied Use of Caffeine: denies use Use of over the counter:   No past surgical history on file.  Past Medical History:  Diagnosis Date  . ADHD (attention deficit hyperactivity disorder)    Outpatient Encounter Medications as of 03/14/2020  Medication Sig  . Amphetamine Sulfate (EVEKEO) 10 MG TABS Take 10 mg by mouth daily.  . ARIPiprazole (ABILIFY) 5 MG tablet Take 1 tablet (5 mg total) by mouth daily.  . busPIRone (BUSPAR) 30 MG tablet Take 1 tablet (30 mg total) by mouth 2 (two) times daily.  . Elagolix Sodium 150 MG TABS Take 150 mg by mouth daily.  Marland Kitchen estradiol (ESTRACE) 2 MG tablet Take 1  tablet (2 mg total) by mouth daily.  . norethindrone (MICRONOR) 0.35 MG tablet TAKE 1 TABLET BY MOUTH EVERY DAY  . sertraline (ZOLOFT) 100 MG tablet Take 1.5 tablets (150 mg total)  by mouth daily.   No facility-administered encounter medications on file as of 03/14/2020.   Allergies  Allergen Reactions  . Shellfish Allergy     Social History   Tobacco Use  . Smoking status: Never Smoker  . Smokeless tobacco: Never Used  Substance Use Topics  . Alcohol use: Not on file   Functioning Relationships: good support system Education: College       Please specify degree:  [employer] Other Pertinent History: None Family History  Problem Relation Age of Onset  . Asthma Mother   . Depression Mother   . Learning disabilities Mother   . ADD / ADHD Father   . Other Father        low testosterone  . Speech disorder Brother      Review of Systems Constitutional: negative  Objective:  There were no vitals filed for this visit.  Physical Exam:   Mental Status Exam: Appearance:  Well groomed Psychomotor::  Within Normal Limits Attention span and concentration: Normal Behavior: adequate rapport can be established Speech:  normal volume Mood:  depressed and anxious Affect:  normal Thought Process:  Coherent Thought Content:  WDL Orientation:  person, place and time/date Cognition:  grossly intact Insight:  Fair Judgment:  Fair Estimate of Intelligence: Average Fund of knowledge: Aware of current events and Intact Memory: Recent and remote intact Abnormal movements: None Gait and station: Normal  Assessment:  Diagnosis: No primary diagnosis found. No diagnosis found.  Indications for admission: inpatient care required if not in partial hospital program  Plan: Orders placed for Occupational Therapy  patient enrolled in Partial Hospitalization Program, patient's current medications are to be continued, a comprehensive treatment plan will be developed and side effects of medications have been reviewed with patient  Treatment options and alternatives reviewed with patient and patient understands the above plan. Treatment plan was reviewed and agreed upon by  NP T.Bobby Rumpf and patient Michael [name] need for group services    Derrill Center, NP

## 2020-03-15 ENCOUNTER — Other Ambulatory Visit (HOSPITAL_COMMUNITY): Payer: BC Managed Care – PPO | Admitting: Licensed Clinical Social Worker

## 2020-03-15 ENCOUNTER — Other Ambulatory Visit: Payer: Self-pay

## 2020-03-15 ENCOUNTER — Encounter (HOSPITAL_COMMUNITY): Payer: Self-pay

## 2020-03-15 ENCOUNTER — Other Ambulatory Visit (HOSPITAL_COMMUNITY): Payer: BC Managed Care – PPO | Admitting: Occupational Therapy

## 2020-03-15 DIAGNOSIS — F332 Major depressive disorder, recurrent severe without psychotic features: Secondary | ICD-10-CM | POA: Diagnosis not present

## 2020-03-15 DIAGNOSIS — F411 Generalized anxiety disorder: Secondary | ICD-10-CM

## 2020-03-15 DIAGNOSIS — F840 Autistic disorder: Secondary | ICD-10-CM

## 2020-03-15 DIAGNOSIS — R4589 Other symptoms and signs involving emotional state: Secondary | ICD-10-CM

## 2020-03-15 DIAGNOSIS — R41844 Frontal lobe and executive function deficit: Secondary | ICD-10-CM

## 2020-03-15 NOTE — Therapy (Signed)
[place], [place], [address] Phone: [phone]   Fax:  [phone] Virtual Visit via Video Note  I connected with Michael [name] on 03/15/20 at  11:00 AM EDT by a video enabled telemedicine application and verified that I am speaking with the correct person using two identifiers.   I discussed the limitations of evaluation and management by telemedicine and the availability of in person appointments. The patient expressed understanding and agreed to proceed.  I discussed the assessment and treatment plan with the patient. The patient was provided an opportunity to ask questions and all were answered. The patient agreed with the plan and demonstrated an understanding of the instructions.   The patient was advised to call back or seek an in-person evaluation if the symptoms worsen or if the condition fails to improve as anticipated.  I provided 60 minutes of non-face-to-face time during this encounter.   Occupational Therapy Treatment  Patient Details  Name: Michael [name] MRN: [id number] Date of Birth: Dec 23, 1990 Referring Provider (OT): Ricky Ala   Encounter Date: 03/15/2020  OT End of Session - 03/15/20 8099    Visit Number  3    Number of Visits  20    Date for OT Re-Evaluation  04/10/20    Authorization Type  BCBS    OT Start Time  1100    OT Stop Time  1200    OT Time Calculation (min)  60 min    Activity Tolerance  Patient tolerated treatment well    Behavior During Therapy  [facility] for tasks assessed/performed       Past Medical History:  Diagnosis Date  . ADHD (attention deficit hyperactivity disorder)   . Autism     History reviewed. No pertinent surgical history.  There were no vitals filed for this visit.  Subjective Assessment - 03/15/20 1347    Currently in Pain?  No/denies          OT Education - 03/15/20 1347    Education Details  Educated on  assertive communication    Person(s) Educated  Patient    Methods  Explanation;Handout;Verbal cues    Comprehension  Verbalized understanding       OT Short Term Goals - 03/14/20 1426      OT SHORT TERM GOAL #1   Status  On-going      OT SHORT TERM GOAL #2   Status  On-going      OT SHORT TERM GOAL #3   Status  On-going      OT SHORT TERM GOAL #4   Status  On-going     Group Session:  S: "I use 'If, then' statements when I'm communicating."   O: Group began with a reflection from previous OT session on communication styles. Group members shared their take aways and reflections and identified if they practiced their assertiveness skills. Today's group continued with a discussion focused on communication, specifically discussing assertive communication with a focus on identifying specific strategies and tips one can utilize to become more assertive. Group members were given various scenarios and role-played passive, assertive, and aggressive responses. Group members then took those same scenarios and practiced identifying an assertive XYZ statement they could utilize. Members shared their own personal life scenarios and worked collaboratively to create an XYZ they could use in a real life situation.    A: Caelen required minimal cues to engage in discussion, at times appearing distracted and/or disinterested  in topics being discussed. Pt shared that she uses 'if, then' statements when communicating, in order to get her needs met/addressed. Aadit shared a scenario where she could be more assertive, in which a family member plays their music loud at night and disrupts patient's sleep. Pt identified XYZ statement: "I feel tired when you play loud music at night while I try to sleep. I would like you to turn it down." Pt also offered alternative of utilizing headphones as a request. Appeared receptive to feedback and support offered by OT and peers.   P: Continue to attend PHP OT group sessions  5x week for 4 weeks to promote daily structure, social engagement, and opportunities to develop and utilize adaptive strategies to maximize functional performance in preparation for safe transition and integration back into school, work, and the community. Plan to address topic of non-verbal communication in next OT group session.     Plan - 03/15/20 1348    Occupational performance deficits (Please refer to evaluation for details):  ADL's;IADL's;Rest and Sleep;Education;Leisure;Social Participation    Body Structure / Function / Physical Skills  ADL    Cognitive Skills  Attention;Emotional;Energy/Drive;Problem Solve;Safety Awareness;Thought    Psychosocial Skills  Coping Strategies;Habits;Routines and Behaviors;Interpersonal Interaction       Patient will benefit from skilled therapeutic intervention in order to improve the following deficits and impairments:   Body Structure / Function / Physical Skills: ADL Cognitive Skills: Attention, Emotional, Energy/Drive, Problem Solve, Safety Awareness, Thought Psychosocial Skills: Coping Strategies, Habits, Routines and Behaviors, Interpersonal Interaction   Visit Diagnosis: Difficulty coping  Frontal lobe and executive function deficit  Severe episode of recurrent major depressive disorder, without psychotic features [facility])    Problem List Patient Active Problem List   Diagnosis Date Noted  . Autistic spectrum disorder 02/11/2020  . MDD (major depressive disorder), recurrent episode, severe ([place]) 02/08/2020  . MDD (major depressive disorder), recurrent, severe, with psychosis ([place]) 02/07/2020  . 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 ADHD (attention deficit hyperactivity disorder), combined type 01/25/2016  . Generalized anxiety disorder 01/25/2016  . Dysgraphia 01/25/2016    Michael [name], MOT, OTR/L  03/15/2020, 1:48 PM  [facility] HOSPITALIZATION PROGRAM [place], [place], [address] Phone: [phone]   Fax:  [phone]  Name: Michael [name] MRN: [id number] Date of Birth: 1991/03/22

## 2020-03-15 NOTE — Progress Notes (Signed)
Spoke with patient via Webex video call, used 2 identifiers to correctly identify patient. States he is a transgender and prefers the pronouns she/her. Was recommeded for PHP after an inpatient stay at [facility] for suicidal thoughts.  Stressors include trying to get into grad school and losing a lot of his online community support due to being blocked after a miscommunication. She hears voices at times but has not recently. They were telling her she would be better off dead when she went inpatient but she does not hear that now. She increased her Abilify on her own this week stating that 5mg  wasn't working. No side effects noted. Denies SI/HI or AV hallucinations. On scale 1-10 as 10 being worst she rates depression at 4/5 and anxiety at 4/5. PHQ9=13. So far enjoying groups. No issues or complaints.

## 2020-03-15 NOTE — Progress Notes (Signed)
Spiritual care group   03/14/2020 11:00-12:00  Group met via web-ex due to COVID-19 precautions.  Group facilitated by Chaplain Elisse Pennick, MDiv, BCC   Group focused on topic of "self-care"  Patients engaged in facilitated discussion about topic.  Explored quotes related to self care and chose one which they agreed with and one which they disliked.  Engaged in discussion around quote choices and their experience / understanding of care for themselves.   

## 2020-03-16 ENCOUNTER — Other Ambulatory Visit (HOSPITAL_COMMUNITY): Payer: BC Managed Care – PPO | Admitting: Licensed Clinical Social Worker

## 2020-03-16 ENCOUNTER — Other Ambulatory Visit: Payer: Self-pay

## 2020-03-16 ENCOUNTER — Encounter (HOSPITAL_COMMUNITY): Payer: Self-pay

## 2020-03-16 ENCOUNTER — Other Ambulatory Visit (HOSPITAL_COMMUNITY): Payer: BC Managed Care – PPO | Admitting: Occupational Therapy

## 2020-03-16 DIAGNOSIS — F840 Autistic disorder: Secondary | ICD-10-CM

## 2020-03-16 DIAGNOSIS — F332 Major depressive disorder, recurrent severe without psychotic features: Secondary | ICD-10-CM

## 2020-03-16 DIAGNOSIS — F411 Generalized anxiety disorder: Secondary | ICD-10-CM

## 2020-03-16 DIAGNOSIS — R41844 Frontal lobe and executive function deficit: Secondary | ICD-10-CM

## 2020-03-16 DIAGNOSIS — R4589 Other symptoms and signs involving emotional state: Secondary | ICD-10-CM

## 2020-03-16 MED ORDER — ARIPIPRAZOLE 10 MG PO TABS: 10.0000 mg | ORAL_TABLET | Freq: Every day | ORAL | 1 refills | Status: DC

## 2020-03-16 NOTE — Telephone Encounter (Signed)
Abilify increased to 10 mg daily, # 30 with 1 RF's.RX for above e-scribed and sent to pharmacy on record  [facility] [phone] [facility], [place] - [address] [place] [address] Phone: [phone] Fax: [phone]

## 2020-03-16 NOTE — Telephone Encounter (Signed)
Patient called in for increase of Abilify from 5mg  to 10mg . Spoke with Provider and she is fine with increase. Last visit 02/21/2020 next visit 05/11/2020. [facility] escribe to [facility] [address] [place]

## 2020-03-16 NOTE — Therapy (Signed)
[place], [place], [address] Phone: [phone]   Fax:  [phone] Virtual Visit via Video Note  I connected with Michael [name] on 03/16/20 at  11:00 AM EDT by a video enabled telemedicine application and verified that I am speaking with the correct person using two identifiers.   I discussed the limitations of evaluation and management by telemedicine and the availability of in person appointments. The patient expressed understanding and agreed to proceed.   I discussed the assessment and treatment plan with the patient. The patient was provided an opportunity to ask questions and all were answered. The patient agreed with the plan and demonstrated an understanding of the instructions.   The patient was advised to call back or seek an in-person evaluation if the symptoms worsen or if the condition fails to improve as anticipated.  I provided 55 minutes of non-face-to-face time during this encounter.    Occupational Therapy Treatment  Patient Details  Name: Michael [name] MRN: [id number] Date of Birth: 06/03/1991 Referring Provider (OT): Ricky Ala   Encounter Date: 03/16/2020  OT End of Session - 03/16/20 1346    Visit Number  4    Number of Visits  20    Date for OT Re-Evaluation  04/10/20    Authorization Type  BCBS    Authorization - Number of Visits  30    OT Start Time  1100    OT Stop Time  1155    OT Time Calculation (min)  55 min    Activity Tolerance  Patient tolerated treatment well    Behavior During Therapy  WFL for tasks assessed/performed       Past Medical History:  Diagnosis Date  . ADHD (attention deficit hyperactivity disorder)   . Autism     History reviewed. No pertinent surgical history.  There were no vitals filed for this visit.  Subjective Assessment - 03/16/20 1345    Currently in Pain?  No/denies       OT Education - 03/16/20 1345     Education Details  Educated on fair fighting rules and tips to improve assertive communication    Person(s) Educated  Patient    Methods  Explanation;Handout    Comprehension  Verbalized understanding       OT Short Term Goals - 03/14/20 1426      OT SHORT TERM GOAL #1   Status  On-going      OT SHORT TERM GOAL #2   Status  On-going      OT SHORT TERM GOAL #3   Status  On-going      OT SHORT TERM GOAL #4   Status  On-going     Group Session:  S: "I have to take time and space to remember that not everything has to be solved in that specific moment."  O: Group began with a reflection from previous OT session on assertiveness skills and training. Group members shared an example of how they practiced being assertive post group and into their evening. Today's group was an expansion on assertive training and communication with a focus on [place]. Group members were provided with a set of 'rules' and tips on how to fight fairly, in order to come off as more assertive and less aggressive. Members identified one rule or tip they felt they struggled most with and were encouraged to focus on improving their communication within identified rule.  A: Michael [name] was receptive to min cues to increase participation in discussion. Pt shared that she did not have the chance to work on being assertive, though recognized need to walk away from situations that cannot be solved in the moment. Pt also spoke about the difficulty communicating virtually, sharing her experience with an online community that she is a member of - noting that the members communicate via text and it is hard to understand tone and feelings, because there is no voice or video to support the words being typed out. Pt shared that this can lead to a misinterpretation of the author's message when received. Michael [name] identified "walking away" as a fair fighting strategy she struggles most with, noting "With my autism, I hit a sensory  overload and just bolt out of the room sometimes." Pt recognized that when this occurs, she utilizes reading a book as a calming strategy to bring herself back to baseline.    P: Continue to attend PHP OT group sessions 5x week for 3 weeks to promote daily structure, social engagement, and opportunities to develop and utilize adaptive strategies to maximize functional performance in preparation for safe transition and integration back into school, work, and the community. Plan to address topic of self-care in next OT group session.     Plan - 03/16/20 1347    Occupational performance deficits (Please refer to evaluation for details):  ADL's;IADL's;Rest and Sleep;Education;Leisure;Social Participation    Body Structure / Function / Physical Skills  ADL    Cognitive Skills  Attention;Emotional;Energy/Drive;Problem Solve;Safety Awareness;Thought    Psychosocial Skills  Coping Strategies;Habits;Routines and Behaviors;Interpersonal Interaction       Patient will benefit from skilled therapeutic intervention in order to improve the following deficits and impairments:   Body Structure / Function / Physical Skills: ADL Cognitive Skills: Attention, Emotional, Energy/Drive, Problem Solve, Safety Awareness, Thought Psychosocial Skills: Coping Strategies, Habits, Routines and Behaviors, Interpersonal Interaction   Visit Diagnosis: Difficulty coping  Frontal lobe and executive function deficit  Severe episode of recurrent major depressive disorder, without psychotic features [facility])    Problem List Patient Active Problem List   Diagnosis Date Noted  . Autistic spectrum disorder 02/11/2020  . MDD (major depressive disorder), recurrent episode, severe ([facility]) 02/08/2020  . MDD (major depressive disorder), recurrent, severe, with psychosis ([facility]) 02/07/2020  . 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 ADHD (attention deficit hyperactivity disorder), combined type 01/25/2016  . Generalized  anxiety disorder 01/25/2016  . Dysgraphia 01/25/2016    [name], MOT, OTR/L  03/16/2020, 1:47 PM  [facility] HOSPITALIZATION PROGRAM [address] [place], [place], [address] Phone: [phone]   Fax:  [phone]  Name: Michael [name] MRN: [id number] Date of Birth: 07-05-1991

## 2020-03-18 ENCOUNTER — Encounter (HOSPITAL_COMMUNITY): Payer: Self-pay | Admitting: Family

## 2020-03-19 ENCOUNTER — Other Ambulatory Visit (HOSPITAL_COMMUNITY): Payer: BC Managed Care – PPO | Admitting: Occupational Therapy

## 2020-03-19 ENCOUNTER — Encounter (HOSPITAL_COMMUNITY): Payer: Self-pay

## 2020-03-19 ENCOUNTER — Other Ambulatory Visit: Payer: Self-pay

## 2020-03-19 ENCOUNTER — Other Ambulatory Visit (HOSPITAL_COMMUNITY): Payer: BC Managed Care – PPO | Admitting: Licensed Clinical Social Worker

## 2020-03-19 DIAGNOSIS — R41844 Frontal lobe and executive function deficit: Secondary | ICD-10-CM

## 2020-03-19 DIAGNOSIS — F332 Major depressive disorder, recurrent severe without psychotic features: Secondary | ICD-10-CM | POA: Diagnosis not present

## 2020-03-19 DIAGNOSIS — F411 Generalized anxiety disorder: Secondary | ICD-10-CM

## 2020-03-19 DIAGNOSIS — F840 Autistic disorder: Secondary | ICD-10-CM

## 2020-03-19 DIAGNOSIS — R4589 Other symptoms and signs involving emotional state: Secondary | ICD-10-CM

## 2020-03-19 NOTE — Psych (Signed)
Virtual Visit via Video Note  I connected with Michael [name] on 03/14/20 at  9:00 AM EDT by a video enabled telemedicine application and verified that I am speaking with the correct person using two identifiers.   I discussed the limitations of evaluation and management by telemedicine and the availability of in person appointments. The patient expressed understanding and agreed to proceed.  Location: Patient: Patient home Provider: Clinical Home Office   Follow Up Instructions:    I discussed the assessment and treatment plan with the patient. The patient was provided an opportunity to ask questions and all were answered. The patient agreed with the plan and demonstrated an understanding of the instructions.   The patient was advised to call back or seek an in-person evaluation if the symptoms worsen or if the condition fails to improve as anticipated.  I provided 240 minutes of non-face-to-face time during this encounter.   Michael [name], [facility]    [facility] PHP THERAPIST PROGRESS NOTE  Michael [name]  Session Time: 9-1  Participation Level: Minimal  Behavioral Response: CasualAlertDepressed  Type of Therapy: Group Therapy  Treatment Goals addressed: Coping  Interventions: CBT, DBT, Solution Focused, Strength-based, Supportive and Reframing  Summary:  Clinician led check-in regarding current stressors and situation, and review of patient completed daily inventory. Clinician utilized active listening and empathetic response and validated patient emotions. Clinician facilitated processing group on pertinent issues.   Therapist Response: Michael [name] is a 29 y.o. adult who presents with depression and anxiety symptoms. Patient arrived within time allowed and reports that she is feeling "pretty good." Patient rates her mood at a 7 on a scale of 1-10 with 10 being great. Pt reports picked up skirts and dresses from a friend yesterday which  helped mood.  Patient engaged minimally in discussion.      Session Time: 10:00- 11:00   Participation Level: Minimal   Behavioral Response: CasualAlertDepressed   Type of Therapy: Group Therapy, OT   Treatment Goals addressed: Coping   Interventions: Psychosocial skills training, Supportive   Summary: Occupational Therapy group   Therapist Response: Patient engaged in group. See OT note.      Session Time: 11:00 -12:00   Participation Level: Minimal   Behavioral Response: CasualAlertDepressed   Type of Therapy: Group Therapy, psychotherapy   Treatment Goals addressed: Coping   Interventions: Strengths based, reframing, Supportive,    Summary:  Spiritual Care group   Therapist Response: Patient engaged in group. See chaplain note.                  Session Time: 12:00 -1:00   Participation Level: Minimal   Behavioral Response: CasualAlertDepressed   Type of Therapy: Group therapy   Treatment Goals addressed: Coping   Interventions: CBT; Solution focused; Supportive; Reframing   Summary: 12:00 - 12:50: Cln introduced grounding and skills that can be used to ground self. 12:50 -1:00 Clinician led check-out. Clinician assessed for immediate needs, medication compliance and efficacy, and safety concerns   Therapist Response: 12:00 - 12:50: Pt engaged in activity and reports they can utilize 5-4-3-2-1 skill.  12:50 - 1:00: At check-out, patient rates her mood at a 7 on a scale of 1-10 with 10 being great. Pt states afternoon plans of resting. Patient demonstrates some progress as evidenced by increased mood. Patient denies SI/HI/self-harm at the end of group.     Suicidal/Homicidal: Nowithout intent/plan    Plan: Pt will continue in PHP while working to decrease  depression and anxiety symptoms, increase ability to manage symptoms in a healthy manner.  Diagnosis: Severe episode of recurrent major depressive disorder, without psychotic features ([place])  [F33.2]    1. Severe episode of recurrent major depressive disorder, without psychotic features [facility])       Michael [name], [name] 03/14/2020

## 2020-03-19 NOTE — Therapy (Signed)
[place], [place], [address] Phone: [phone]   Fax:  [phone]  Virtual Visit via Video Note  I connected with Michael [name] on 03/19/20 at  12:00 PM EDT by a video enabled telemedicine application and verified that I am speaking with the correct person using two identifiers.   I discussed the limitations of evaluation and management by telemedicine and the availability of in person appointments. The patient expressed understanding and agreed to proceed.   I discussed the assessment and treatment plan with the patient. The patient was provided an opportunity to ask questions and all were answered. The patient agreed with the plan and demonstrated an understanding of the instructions.   The patient was advised to call back or seek an in-person evaluation if the symptoms worsen or if the condition fails to improve as anticipated.  I provided 50 minutes of non-face-to-face time during this encounter.   Occupational Therapy Treatment  Patient Details  Name: Michael [name] MRN: [id number] Date of Birth: 12/01/1990 Referring Provider (OT): Ricky Ala   Encounter Date: 03/19/2020  OT End of Session - 03/19/20 1349    Visit Number  5    Number of Visits  20    Date for OT Re-Evaluation  04/10/20    Authorization Type  BC[employer]    Authorization - Number of Visits  30    OT Start Time  1200    OT Stop Time  1250    OT Time Calculation (min)  50 min    Activity Tolerance  Patient tolerated treatment well    Behavior During Therapy  [facility] for tasks assessed/performed       Past Medical History:  Diagnosis Date  . ADHD (attention deficit hyperactivity disorder)   . Autism     History reviewed. No pertinent surgical history.  There were no vitals filed for this visit.  Subjective Assessment - 03/19/20 1349    Currently in Pain?  No/denies        OT Education - 03/19/20 1349     Education Details  Educated on tips and strategies to improve overall self-care    Person(s) Educated  Patient    Methods  Explanation;Handout    Comprehension  Verbalized understanding       OT Short Term Goals - 03/14/20 1426      OT SHORT TERM GOAL #1   Status  On-going      OT SHORT TERM GOAL #2   Status  On-going      OT SHORT TERM GOAL #3   Status  On-going      OT SHORT TERM GOAL #4   Status  On-going     Group Session:  S: "Doing my makeup helps with like my gender dysphoria."  O: Group began with a reflection from last week's OT sessions focused on assertive communication skills. Group members shared their weekend events and identified one conversation or situation in which they practiced being assertive and shared the outcome. Today's group session focused on topic of Self-Care. Group began with members completing a self-care assessment that identified a specific category within self-care that they needed the most improvement in, including physical, emotional/psychological, social, spiritual, and professional self-care. Discussion focused on members sharing which areas they need the most work in and brainstormed strategies and tips on improving one's self-care with the thought that what someone identifies as their "best self" changes day-to-day contingent upon mood,  emotions, and situations. Members identified both one "big" and one "small" self-care activity they can and would like to engage in the near future.   A: Michael [name] was receptive to min cues to increase her participation in group discussion. Pt shared that she feels as though she does well overall with her self-care, though noted improved in physical self-care category. She reported that she often engages in her routine hygiene after group is over, though noted she could improve her self-care and self-esteem by practicing her makeup more often. Pt shared that when she wears makeup it helps boost her confidence,  self-esteem, and addresses her gender dysphoria. Pt identified "knitting" as one small self-care activity that she enjoys engaging in.   P: Continue to attend PHP OT group sessions 5x week for 2 weeks to promote daily structure, social engagement, and opportunities to develop and utilize adaptive strategies to maximize functional performance in preparation for safe transition and integration back into school, work, and the community. Plan to address topic of work/life balance in next OT group session.    Plan - 03/19/20 1350    Occupational performance deficits (Please refer to evaluation for details):  ADL's;IADL's;Rest and Sleep;Education;Leisure;Social Participation    Body Structure / Function / Physical Skills  ADL    Cognitive Skills  Attention;Emotional;Energy/Drive;Problem Solve;Safety Awareness;Thought    Psychosocial Skills  Coping Strategies;Habits;Routines and Behaviors;Interpersonal Interaction       Patient will benefit from skilled therapeutic intervention in order to improve the following deficits and impairments:   Body Structure / Function / Physical Skills: ADL Cognitive Skills: Attention, Emotional, Energy/Drive, Problem Solve, Safety Awareness, Thought Psychosocial Skills: Coping Strategies, Habits, Routines and Behaviors, Interpersonal Interaction   Visit Diagnosis: Difficulty coping  Frontal lobe and executive function deficit  Severe episode of recurrent major depressive disorder, without psychotic features [facility])    Problem List Patient Active Problem List   Diagnosis Date Noted  . Autistic spectrum disorder 02/11/2020  . MDD (major depressive disorder), recurrent episode, severe (HCC) 02/08/2020  . MDD (major depressive disorder), recurrent, severe, with psychosis (HCC) 02/07/2020  . 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 ADHD (attention deficit hyperactivity disorder), combined type 01/25/2016  . Generalized anxiety disorder 01/25/2016  .  Dysgraphia 01/25/2016    [name], MOT, OTR/L  03/19/2020, 1:50 PM  [facility] HOSPITALIZATION PROGRAM [address] [place], [place], [address] Phone: [phone]   Fax:  [phone]  Name: Michael [name] MRN: [id number] Date of Birth: Mar 17, 1991

## 2020-03-19 NOTE — Progress Notes (Signed)
Spoke with patient via Webex video call, used 2 identifiers to correctly identify patient. States that group is going well, it is helping with processing. Sleeping ok, has not heard any voices in 2 days since increasing Abilify to 2mg . Denies SI/HI or AV hallucinations. On scale 1-10 as 10 being worst, rates depression at 5 and anxiety at 4. No side effects from medication. No other issues or complaints.

## 2020-03-20 ENCOUNTER — Encounter (HOSPITAL_COMMUNITY): Payer: Self-pay

## 2020-03-20 ENCOUNTER — Other Ambulatory Visit (HOSPITAL_COMMUNITY): Payer: BC Managed Care – PPO | Admitting: Licensed Clinical Social Worker

## 2020-03-20 ENCOUNTER — Other Ambulatory Visit (HOSPITAL_COMMUNITY): Payer: BC Managed Care – PPO | Admitting: Occupational Therapy

## 2020-03-20 ENCOUNTER — Other Ambulatory Visit: Payer: Self-pay

## 2020-03-20 DIAGNOSIS — F332 Major depressive disorder, recurrent severe without psychotic features: Secondary | ICD-10-CM | POA: Diagnosis not present

## 2020-03-20 DIAGNOSIS — F411 Generalized anxiety disorder: Secondary | ICD-10-CM

## 2020-03-20 DIAGNOSIS — R4589 Other symptoms and signs involving emotional state: Secondary | ICD-10-CM

## 2020-03-20 DIAGNOSIS — R41844 Frontal lobe and executive function deficit: Secondary | ICD-10-CM

## 2020-03-20 NOTE — Progress Notes (Signed)
Virtual Visit via Video Note  I connected with Michael [name] on 03/20/20 at  9:00 AM EDT by a video enabled telemedicine application and verified that I am speaking with the correct person using two identifiers.   I discussed the limitations of evaluation and management by telemedicine and the availability of in person appointments. The patient expressed understanding and agreed to proceed.     I discussed the assessment and treatment plan with the patient. The patient was provided an opportunity to ask questions and all were answered. The patient agreed with the plan and demonstrated an understanding of the instructions.   The patient was advised to call back or seek an in-person evaluation if the symptoms worsen or if the condition fails to improve as anticipated.  I provided 15 minutes of non-face-to-face time during this encounter.   Oneta Rack, NP   BH MD/PA/NP OP Progress Note  03/20/2020 10:03 AM Michael [name]  MRN:  [id number]   Evaluation: "Michael [name]" was was seen and evaluated via teleassessment.  She presents flat, guarded but pleasant.  Denying suicidal or homicidal ideations.  Was reported during treatment team that patient self titrated medication however denied during this assessment.  Reported taking Abilify 10 mg p.o. daily. Medication was clarified during this assessment.   She stated that her auditory hallucinations have subsided.  Denies any medication side effects during this assessment.  Patient reports he continues to work on send prescription to help with her mood.  Continues to ruminate regarding community lack of support.  Reports a good appetite.  States she is resting well throughout the night.  Support, encouragement and reassurance was provided.   Visit Diagnosis: No diagnosis found.  Past Psychiatric History:   Past Medical History:  Past Medical History:  Diagnosis Date  . ADHD (attention deficit hyperactivity disorder)   . Autism     No past surgical history on file.  Family Psychiatric History:   Family History:  Family History  Problem Relation Age of Onset  . Asthma Mother   . Depression Mother   . Learning disabilities Mother   . ADD / ADHD Father   . Other Father        low testosterone  . Speech disorder Brother     Social History:  Social History   Socioeconomic History  . Marital status: Single    Spouse name: Not on file  . Number of children: Not on file  . Years of education: Not on file  . Highest education level: Not on file  Occupational History  . Not on file  Tobacco Use  . Smoking status: Never Smoker  . Smokeless tobacco: Never Used  Substance and Sexual Activity  . Alcohol use: Not on file  . Drug use: Not on file  . Sexual activity: Not on file  Other Topics Concern  . Not on file  Social History Narrative  . Not on file   Social Determinants of Health   Financial Resource Strain:   . Difficulty of Paying Living Expenses:   Food Insecurity:   . Worried About Programme researcher, broadcasting/film/video in the Last Year:   . Barista in the Last Year:   Transportation Needs:   . Freight forwarder (Medical):   Marland Kitchen Lack of Transportation (Non-Medical):   Physical Activity:   . Days of Exercise per Week:   . Minutes of Exercise per Session:   Stress:   . Feeling of Stress :  Social Connections:   . Frequency of Communication with Friends and Family:   . Frequency of Social Gatherings with Friends and Family:   . Attends Religious Services:   . Active Member of Clubs or Organizations:   . Attends Banker Meetings:   Marland Kitchen Marital Status:     Allergies:  Allergies  Allergen Reactions  . Shellfish Allergy     Metabolic Disorder Labs: Lab Results  Component Value Date   HGBA1C 5.4 02/09/2020   MPG 108.28 02/09/2020   No results found for: PROLACTIN Lab Results  Component Value Date   CHOL 163 02/09/2020   TRIG 87 02/09/2020   HDL 59 02/09/2020   CHOLHDL 2.8  02/09/2020   VLDL 17 02/09/2020   LDLCALC 87 02/09/2020   Lab Results  Component Value Date   TSH 0.658 02/09/2020   TSH 0.98 08/26/2019    Therapeutic Level Labs: No results found for: LITHIUM No results found for: VALPROATE No components found for:  CBMZ  Current Medications: Current Outpatient Medications  Medication Sig Dispense Refill  . Amphetamine Sulfate (EVEKEO) 10 MG TABS Take 10 mg by mouth daily. 30 tablet 0  . ARIPiprazole (ABILIFY) 10 MG tablet Take 1 tablet (10 mg total) by mouth daily. 30 tablet 1  . busPIRone (BUSPAR) 30 MG tablet Take 1 tablet (30 mg total) by mouth 2 (two) times daily. 60 tablet 1  . Elagolix Sodium 150 MG TABS Take 150 mg by mouth daily. (Patient not taking: Reported on 03/15/2020) 30 tablet 11  . estradiol (ESTRACE) 2 MG tablet Take 1 tablet (2 mg total) by mouth daily. 90 tablet 3  . norethindrone (MICRONOR) 0.35 MG tablet TAKE 1 TABLET BY MOUTH EVERY DAY 84 tablet 2  . sertraline (ZOLOFT) 100 MG tablet Take 1.5 tablets (150 mg total) by mouth daily. 45 tablet 1   No current facility-administered medications for this visit.     Musculoskeletal:   Psychiatric Specialty Exam: Review of Systems  Psychiatric/Behavioral: The patient is nervous/anxious.   All other systems reviewed and are negative.   There were no vitals taken for this visit.There is no height or weight on file to calculate BMI.  General Appearance: Casual  Eye Contact:  Fair  Speech:  Clear and Coherent  Volume:  Normal  Mood:  Anxious and Depressed  Affect:  Congruent  Thought Process:  Coherent  Orientation:  Full (Time, Place, and Person)  Thought Content: Logical   Suicidal Thoughts:  No  Homicidal Thoughts:  No  Memory:  Immediate;   Fair Recent;   Fair  Judgement:  Fair  Insight:  Fair  Psychomotor Activity:  Normal  Concentration:  Concentration: Fair  Recall:  [employer] of Knowledge: Fair  Language: Fair  Akathisia:  No  Handed:  Right  AIMS (if  indicated):   Assets:  Communication Skills Desire for Improvement Resilience Social Support  ADL's:  Intact  Cognition: WNL  Sleep:  Fair   Screenings: AUDIT     Admission (Discharged) from 02/08/2020 in [facility] INPATIENT BEHAVIORAL MEDICINE  Alcohol Use Disorder Identification Test Final Score (AUDIT)  0    PHQ2-9     Counselor from 03/15/2020 in BEHAVIORAL HEALTH PARTIAL HOSPITALIZATION PROGRAM  PHQ-2 Total Score  3  PHQ-9 Total Score  13       Assessment and Plan:  Continue Partial Hospitalization Programming  Patient to continue Abilify 10 mg, Buspar 30 mg and Zoloft 100 mg as directed   Treatment plan  was reviewed and agreed upon by NP T.Bobby Rumpf and patient Michael Littler" [name]  need for group services.    Derrill Center, NP 03/20/2020, 10:03 AM

## 2020-03-20 NOTE — Therapy (Signed)
[place], [place], [address] Phone: [phone]   Fax:  [phone] Virtual Visit via Video Note  I connected with Michael [name] on 03/20/20 at  11:00 AM EDT by a video enabled telemedicine application and verified that I am speaking with the correct person using two identifiers.   I discussed the limitations of evaluation and management by telemedicine and the availability of in person appointments. The patient expressed understanding and agreed to proceed.   I discussed the assessment and treatment plan with the patient. The patient was provided an opportunity to ask questions and all were answered. The patient agreed with the plan and demonstrated an understanding of the instructions.   The patient was advised to call back or seek an in-person evaluation if the symptoms worsen or if the condition fails to improve as anticipated.  I provided 60 minutes of non-face-to-face time during this encounter.    Occupational Therapy Treatment  Patient Details  Name: Michael [name] MRN: [id number] Date of Birth: 03/24/1991 Referring Provider (OT): Ricky Ala   Encounter Date: 03/20/2020  OT End of Session - 03/20/20 1316    Visit Number  6    Number of Visits  20    Date for OT Re-Evaluation  04/10/20    Authorization Type  BCBS    Authorization - Number of Visits  30    OT Start Time  1105    OT Stop Time  1205    OT Time Calculation (min)  60 min    Activity Tolerance  Patient tolerated treatment well    Behavior During Therapy  WFL for tasks assessed/performed       Past Medical History:  Diagnosis Date  . ADHD (attention deficit hyperactivity disorder)   . Autism     History reviewed. No pertinent surgical history.  There were no vitals filed for this visit.  Subjective Assessment - 03/20/20 1315    Currently in Pain?  No/denies         OT Education - 03/20/20  1315    Education Details  Educated on strategies to improve self-care and address work/life balance    Person(s) Educated  Patient    Methods  Explanation;Handout    Comprehension  Verbalized understanding       OT Short Term Goals - 03/14/20 1426      OT SHORT TERM GOAL #1   Status  On-going      OT SHORT TERM GOAL #2   Status  On-going      OT SHORT TERM GOAL #3   Status  On-going      OT SHORT TERM GOAL #4   Status  On-going     Group Session:  S: "I have difficulty saying 'no' especially when it comes to things my family asks of me."  O: Group began with a review from previous OT session focused on self-care and members shared one small self-care activity they were able to engage in yesterday afternoon. Today's group session continued discussion on self-care, along with addressing work-life balance. Group began with overview on self-care tips, including setting specific self-care goals and carving out time in one's schedule to engage in meaningful self-care or leisure interests. Discussion continued with a transition to address work-life balance, with patients sharing where they felt they needed more improvement - working too much or holding life's responsibilities at a higher stake? Tips on how to balance work and  life were discussed.   A: Michael [name] was receptive to min cues to increase participation in discussion, though appeared attentive and engaged for duration. Pt shared that she read up on and learned more sanskrit as her self-care activity yesterday and noted benefit of time spent. Pt shared that she thinks she does a good job managing her life and for her, school balance, though noted difficulty saying 'no' when others ask requests of her. Pt shared that this is more difficult with close friends and family and at times, has affected her overall self-care and ability to take time for herself. Appeared receptive to tips and suggestions offered by both peers and OT to improve upon  ability to be more assertive in saying 'no' in order to get self-care needs met.    P: Continue to attend PHP OT group sessions 5x week for 2 weeks to promote daily structure, social engagement, and opportunities to develop and utilize adaptive strategies to maximize functional performance in preparation for safe transition and integration back into school, work, and the community. Plan to address topic of physical wellness and exercise in next OT group session.   Plan - 03/20/20 1316    Occupational performance deficits (Please refer to evaluation for details):  ADL's;IADL's;Rest and Sleep;Education;Leisure;Social Participation    Body Structure / Function / Physical Skills  ADL    Cognitive Skills  Attention;Emotional;Energy/Drive;Problem Solve;Safety Awareness;Thought    Psychosocial Skills  Coping Strategies;Habits;Routines and Behaviors;Interpersonal Interaction       Patient will benefit from skilled therapeutic intervention in order to improve the following deficits and impairments:   Body Structure / Function / Physical Skills: ADL Cognitive Skills: Attention, Emotional, Energy/Drive, Problem Solve, Safety Awareness, Thought Psychosocial Skills: Coping Strategies, Habits, Routines and Behaviors, Interpersonal Interaction   Visit Diagnosis: Difficulty coping  Frontal lobe and executive function deficit  Severe episode of recurrent major depressive disorder, without psychotic features [facility])    Problem List Patient Active Problem List   Diagnosis Date Noted  . Autistic spectrum disorder 02/11/2020  . MDD (major depressive disorder), recurrent episode, severe ([place]) 02/08/2020  . MDD (major depressive disorder), recurrent, severe, with psychosis ([place]) 02/07/2020  . 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 ADHD (attention deficit hyperactivity disorder), combined type 01/25/2016  . Generalized anxiety disorder 01/25/2016  . Dysgraphia 01/25/2016    Ponciano Ort,  MOT, OTR/L  03/20/2020, 1:17 PM  [facility] HOSPITALIZATION PROGRAM [place], [place], [address] Phone: [phone]   Fax:  [phone]  Name: Michael [name] MRN: [id number] Date of Birth: Mar 11, 1991

## 2020-03-21 ENCOUNTER — Other Ambulatory Visit (HOSPITAL_COMMUNITY): Payer: BC Managed Care – PPO | Admitting: Occupational Therapy

## 2020-03-21 ENCOUNTER — Other Ambulatory Visit: Payer: Self-pay

## 2020-03-21 ENCOUNTER — Encounter (HOSPITAL_COMMUNITY): Payer: Self-pay

## 2020-03-21 ENCOUNTER — Other Ambulatory Visit (HOSPITAL_COMMUNITY): Payer: BC Managed Care – PPO | Admitting: Licensed Clinical Social Worker

## 2020-03-21 ENCOUNTER — Encounter (HOSPITAL_COMMUNITY): Payer: Self-pay | Admitting: Family

## 2020-03-21 DIAGNOSIS — R41844 Frontal lobe and executive function deficit: Secondary | ICD-10-CM

## 2020-03-21 DIAGNOSIS — F332 Major depressive disorder, recurrent severe without psychotic features: Secondary | ICD-10-CM

## 2020-03-21 DIAGNOSIS — F411 Generalized anxiety disorder: Secondary | ICD-10-CM

## 2020-03-21 DIAGNOSIS — F840 Autistic disorder: Secondary | ICD-10-CM

## 2020-03-21 DIAGNOSIS — R4589 Other symptoms and signs involving emotional state: Secondary | ICD-10-CM

## 2020-03-21 NOTE — Therapy (Signed)
[facility] PARTIAL HOSPITALIZATION PROGRAM [address] [place], [place], [address] Phone: [phone]   Fax:  [phone] Virtual Visit via Video Note  I connected with Michael [name] on 03/21/20 at  12:00 PM EDT by a video enabled telemedicine application and verified that I am speaking with the correct person using two identifiers.   I discussed the limitations of evaluation and management by telemedicine and the availability of in person appointments. The patient expressed understanding and agreed to proceed.  I discussed the assessment and treatment plan with the patient. The patient was provided an opportunity to ask questions and all were answered. The patient agreed with the plan and demonstrated an understanding of the instructions.   The patient was advised to call back or seek an in-person evaluation if the symptoms worsen or if the condition fails to improve as anticipated.  I provided 50 minutes of non-face-to-face time during this encounter.   Occupational Therapy Treatment  Patient Details  Name: Michael [name] MRN: [id number] Date of Birth: November 26, 1990 Referring Provider (OT): Hillery Jacks   Encounter Date: 03/21/2020  OT End of Session - 03/21/20 1558    Visit Number  7    Number of Visits  20    Date for OT Re-Evaluation  04/10/20    Authorization Type  BCBS    Authorization - Number of Visits  30    OT Start Time  1200    OT Stop Time  1250    OT Time Calculation (min)  50 min    Activity Tolerance  Patient tolerated treatment well    Behavior During Therapy  [facility] for tasks assessed/performed       Past Medical History:  Diagnosis Date  . ADHD (attention deficit hyperactivity disorder)   . Autism     History reviewed. No pertinent surgical history.  There were no vitals filed for this visit.  Subjective Assessment - 03/21/20 1558    Currently in Pain?  No/denies             OT Education -  03/21/20 1558    Education Details  Educated on mental health benefits of exercise while also engaging in active movement practice of chair yoga and brief meditation.    Person(s) Educated  Patient    Methods  Explanation;Handout;Demonstration    Comprehension  Verbalized understanding;Returned demonstration       OT Short Term Goals - 03/14/20 1426      OT SHORT TERM GOAL #1   Status  On-going      OT SHORT TERM GOAL #2   Status  On-going      OT SHORT TERM GOAL #3   Status  On-going      OT SHORT TERM GOAL #4   Status  On-going     Group Session:  S: I would like to exercise more.   O: Group session encouraged increased participation and engagement through discussion focused on mental health benefits of exercise. Group discussion also identified various tips and strategies to begin an exercise routine, by starting small, once a week, and building it into a consistent routine. Group members were also provided with a self-assessment identifying specific barriers to being active, with additional suggestions and strategies to address identified barriers. Following discussion, group members engaged in an active movement practice where the OT guided members through a variety of different stretches and chair yoga exercises. Patients were encouraged to engage in some form of active  movement, exercise, or practice at least 1x day in order to improve overall health and wellness.    A: [name] appeared somewhat withdrawn and distracted during group, however noted benefit of engaging in active movement. Pt shared goal of wanting to exercise more and recognized need to start in small increments in order to achieve a bigger goal. Identified intent to engage in an active movement practice this afternoon.  P: Continue to attend PHP OT group sessions 5x week for 2 weeks to promote daily structure, social engagement, and opportunities to develop and utilize adaptive strategies to maximize functional  performance in preparation for safe transition and integration back into school, work, and the community. Plan to address topic of work/life balance and time management in next OT group session.     Plan - 03/21/20 1558    Occupational performance deficits (Please refer to evaluation for details):  ADL's;IADL's;Rest and Sleep;Education;Leisure;Social Participation    Body Structure / Function / Physical Skills  ADL    Cognitive Skills  Attention;Emotional;Energy/Drive;Problem Solve;Safety Awareness;Thought    Psychosocial Skills  Coping Strategies;Habits;Routines and Behaviors;Interpersonal Interaction       Patient will benefit from skilled therapeutic intervention in order to improve the following deficits and impairments:   Body Structure / Function / Physical Skills: ADL Cognitive Skills: Attention, Emotional, Energy/Drive, Problem Solve, Safety Awareness, Thought Psychosocial Skills: Coping Strategies, Habits, Routines and Behaviors, Interpersonal Interaction   Visit Diagnosis: Difficulty coping  Frontal lobe and executive function deficit  Severe episode of recurrent major depressive disorder, without psychotic features [facility])    Problem List Patient Active Problem List   Diagnosis Date Noted  . Autistic spectrum disorder 02/11/2020  . MDD (major depressive disorder), recurrent episode, severe ([place]) 02/08/2020  . MDD (major depressive disorder), recurrent, severe, with psychosis ([place]) 02/07/2020  . 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 ADHD (attention deficit hyperactivity disorder), combined type 01/25/2016  . Generalized anxiety disorder 01/25/2016  . Dysgraphia 01/25/2016    Ponciano Ort, MOT, OTR/L  03/21/2020, 3:59 PM  [facility] HOSPITALIZATION PROGRAM [place], [place], [address] Phone: [phone]   Fax:  [phone]  Name: Michael [name] MRN: [id number] Date of Birth: 09-Jun-1991

## 2020-03-22 ENCOUNTER — Encounter (HOSPITAL_COMMUNITY): Payer: Self-pay

## 2020-03-22 ENCOUNTER — Other Ambulatory Visit: Payer: Self-pay

## 2020-03-22 ENCOUNTER — Other Ambulatory Visit (HOSPITAL_COMMUNITY): Payer: BC Managed Care – PPO | Admitting: Licensed Clinical Social Worker

## 2020-03-22 ENCOUNTER — Other Ambulatory Visit (HOSPITAL_COMMUNITY): Payer: BC Managed Care – PPO | Admitting: Occupational Therapy

## 2020-03-22 DIAGNOSIS — F332 Major depressive disorder, recurrent severe without psychotic features: Secondary | ICD-10-CM

## 2020-03-22 DIAGNOSIS — R4589 Other symptoms and signs involving emotional state: Secondary | ICD-10-CM

## 2020-03-22 DIAGNOSIS — R41844 Frontal lobe and executive function deficit: Secondary | ICD-10-CM

## 2020-03-22 DIAGNOSIS — F411 Generalized anxiety disorder: Secondary | ICD-10-CM

## 2020-03-22 DIAGNOSIS — F840 Autistic disorder: Secondary | ICD-10-CM

## 2020-03-22 NOTE — Therapy (Signed)
[place], [place], [address] Phone: [phone]   Fax:  [phone] Virtual Visit via Video Note  I connected with Michael [name] on 03/22/20 at  11:00 AM EDT by a video enabled telemedicine application and verified that I am speaking with the correct person using two identifiers.   I discussed the limitations of evaluation and management by telemedicine and the availability of in person appointments. The patient expressed understanding and agreed to proceed.   I discussed the assessment and treatment plan with the patient. The patient was provided an opportunity to ask questions and all were answered. The patient agreed with the plan and demonstrated an understanding of the instructions.   The patient was advised to call back or seek an in-person evaluation if the symptoms worsen or if the condition fails to improve as anticipated.  I provided 60 minutes of non-face-to-face time during this encounter.   Occupational Therapy Treatment  Patient Details  Name: Michael [name] MRN: [id number] Date of Birth: Mar 06, 1991 Referring Provider (OT): Ricky Ala   Encounter Date: 03/22/2020   OT End of Session - 03/22/20 1238    Visit Number 8    Number of Visits 20    Date for OT Re-Evaluation 04/10/20    Authorization Type BCBS    Authorization - Number of Visits 30    OT Start Time 1100    OT Stop Time 1200    OT Time Calculation (min) 60 min    Activity Tolerance Patient tolerated treatment well    Behavior During Therapy WFL for tasks assessed/performed           Past Medical History:  Diagnosis Date  . ADHD (attention deficit hyperactivity disorder)   . Autism     History reviewed. No pertinent surgical history.  There were no vitals filed for this visit.   Subjective Assessment - 03/22/20 1238    Currently in Pain? No/denies            OT Education - 03/22/20  1238    Education Details Educated and provided strategies to improve work/life balance responsibilities    Person(s) Educated Patient    Methods Explanation    Comprehension Verbalized understanding            OT Short Term Goals - 03/14/20 1426      OT SHORT TERM GOAL #1   Status On-going      OT SHORT TERM GOAL #2   Status On-going      OT SHORT TERM GOAL #3   Status On-going      OT SHORT TERM GOAL #4   Status On-going         Group Session:  S: "I use if, then statements throughout the day and in the moment to get things done."  O: Group began with a review from previous OT session focused on physical activity/exercise and patients shared what active movement they engaged in yesterday as a goal to incorporate more exercise into their daily routine. Today's group session wrapped up discussion from previous session focused on work-life balance. Discussion focused on identifying specific strategies to balance work responsibilities, including delegating tasks, creating short-term goals, prioritizing, asking for flexibility, and taking breaks. Group members identified one area in which they struggle most with and brainstormed an effective strategy to address this.    A: [name] was receptive to min cues to increase her participation in discussion. Pt shared  that she was able to go for a walk with a friend for her active movement practice and noted benefit of doing so. Pt also shared that when it comes to balancing school and life, she struggles to identify short and long-term goals, often doing so in the moment. She shared a strategy that works for her, in which she creates "if then" statements in her head to achieve a task and offer self a reward (ex, If I complete that school assignment, then I can go for a walk with my friend.) Pt shared this works for her, as it makes tasks more achievable and logical in her mind.    P: Continue to attend PHP OT group sessions 5x week for 2 weeks  to promote daily structure, social engagement, and opportunities to develop and utilize adaptive strategies to maximize functional performance in preparation for safe transition and integration back into school, work, and the community. Plan to address topic of circle of control in next OT group session.    Plan - 03/22/20 1238    Occupational performance deficits (Please refer to evaluation for details): ADL's;IADL's;Rest and Sleep;Education;Leisure;Social Participation    Body Structure / Function / Physical Skills ADL    Cognitive Skills Attention;Emotional;Energy/Drive;Problem Solve;Safety Awareness;Thought    Psychosocial Skills Coping Strategies;Habits;Routines and Behaviors;Interpersonal Interaction           Patient will benefit from skilled therapeutic intervention in order to improve the following deficits and impairments:   Body Structure / Function / Physical Skills: ADL Cognitive Skills: Attention, Emotional, Energy/Drive, Problem Solve, Safety Awareness, Thought Psychosocial Skills: Coping Strategies, Habits, Routines and Behaviors, Interpersonal Interaction   Visit Diagnosis: Difficulty coping  Frontal lobe and executive function deficit  Severe episode of recurrent major depressive disorder, without psychotic features [facility])    Problem List Patient Active Problem List   Diagnosis Date Noted  . Autistic spectrum disorder 02/11/2020  . MDD (major depressive disorder), recurrent episode, severe ([facility]) 02/08/2020  . MDD (major depressive disorder), recurrent, severe, with psychosis ([facility]) 02/07/2020  . 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 ADHD (attention deficit hyperactivity disorder), combined type 01/25/2016  . Generalized anxiety disorder 01/25/2016  . Dysgraphia 01/25/2016    Michael [name], MOT, OTR/L  03/22/2020, 12:39 PM  [facility] HOSPITALIZATION PROGRAM [address] [place], [place], [address] Phone:  [phone]   Fax:  [phone]  Name: Michael [name] MRN: [id number] Date of Birth: Jun 28, 1991

## 2020-03-23 ENCOUNTER — Other Ambulatory Visit (HOSPITAL_COMMUNITY): Payer: BC Managed Care – PPO | Admitting: Occupational Therapy

## 2020-03-23 ENCOUNTER — Other Ambulatory Visit: Payer: Self-pay

## 2020-03-23 ENCOUNTER — Encounter (HOSPITAL_COMMUNITY): Payer: Self-pay

## 2020-03-23 ENCOUNTER — Other Ambulatory Visit (HOSPITAL_COMMUNITY): Payer: BC Managed Care – PPO | Admitting: Licensed Clinical Social Worker

## 2020-03-23 DIAGNOSIS — F411 Generalized anxiety disorder: Secondary | ICD-10-CM

## 2020-03-23 DIAGNOSIS — R4589 Other symptoms and signs involving emotional state: Secondary | ICD-10-CM

## 2020-03-23 DIAGNOSIS — F332 Major depressive disorder, recurrent severe without psychotic features: Secondary | ICD-10-CM

## 2020-03-23 DIAGNOSIS — F840 Autistic disorder: Secondary | ICD-10-CM

## 2020-03-23 DIAGNOSIS — R41844 Frontal lobe and executive function deficit: Secondary | ICD-10-CM

## 2020-03-23 NOTE — Therapy (Signed)
[facility] PARTIAL HOSPITALIZATION PROGRAM [address] [place], [place], [address] Phone: [phone]   Fax:  [phone] Virtual Visit via Video Note  I connected with Michael [name] on 03/23/20 at  11:00 AM EDT by a video enabled telemedicine application and verified that I am speaking with the correct person using two identifiers.   I discussed the limitations of evaluation and management by telemedicine and the availability of in person appointments. The patient expressed understanding and agreed to proceed.    I discussed the assessment and treatment plan with the patient. The patient was provided an opportunity to ask questions and all were answered. The patient agreed with the plan and demonstrated an understanding of the instructions.   The patient was advised to call back or seek an in-person evaluation if the symptoms worsen or if the condition fails to improve as anticipated.  I provided 65 minutes of non-face-to-face time during this encounter.   Occupational Therapy Treatment  Patient Details  Name: Michael [name] MRN: [id number] Date of Birth: 09/08/1991 Referring Provider (OT): Hillery Jacks   Encounter Date: 03/23/2020   OT End of Session - 03/23/20 1235    Visit Number 9    Number of Visits 20    Date for OT Re-Evaluation 04/10/20    Authorization Type BCBS    Authorization - Number of Visits 30    OT Start Time 1100    OT Stop Time 1205    OT Time Calculation (min) 65 min    Activity Tolerance Patient tolerated treatment well    Behavior During Therapy WFL for tasks assessed/performed           Past Medical History:  Diagnosis Date  . ADHD (attention deficit hyperactivity disorder)   . Autism     History reviewed. No pertinent surgical history.  There were no vitals filed for this visit.   Subjective Assessment - 03/23/20 1235    Currently in Pain? No/denies             OT Education -  03/23/20 1235    Education Details Educated on identifying worry and utilized circle on control tool to categorize what we do and do not have control over    Person(s) Educated Patient    Methods Explanation;Handout    Comprehension Verbalized understanding            OT Short Term Goals - 03/14/20 1426      OT SHORT TERM GOAL #1   Status On-going      OT SHORT TERM GOAL #2   Status On-going      OT SHORT TERM GOAL #3   Status On-going      OT SHORT TERM GOAL #4   Status On-going         Group Session:  S: "I tend to worry about how other people view or perceive me."  O: Group session encouraged increased participation and engagement through discussion focused on worry and our circle of control. Group reviewed a powerpoint that discussed healthy vs unhealthy worry with specific examples provided. Discussion also focused on utilizing the circle of control outline to identify what is within our control, what we have influence on, and what is not in our control. Group members shared specific examples and worries and identified what categories they fell in within the circle of control.   A: Michael [name] was active in her participation of discussion and engaging with other group members. Pt  shared she often worries about how other people view and perceive her and noted she could influence their thoughts about her. OT challenged patient on this thought, and she was able to recognize what she can and cannot control in this situation. She CAN control her own behaviors and how she acts around these people and whether or not she wants to hang around with them. She CANNOT control the other people's behaviors or perceived thoughts. She appeared receptive to this education and clarity, recognizing the difference and identified she cannot control the thought's of others. Noted benefit of discussion.   P: Continue to attend PHP OT group sessions 5x week for 2 weeks to promote daily structure, social  engagement, and opportunities to develop and utilize adaptive strategies to maximize functional performance in preparation for safe transition and integration back into school, work, and the community. Plan to address topic of goal-setting in next OT group session.    Plan - 03/23/20 1235    Occupational performance deficits (Please refer to evaluation for details): ADL's;IADL's;Rest and Sleep;Education;Leisure;Social Participation    Body Structure / Function / Physical Skills ADL    Cognitive Skills Attention;Emotional;Energy/Drive;Problem Solve;Safety Awareness;Thought    Psychosocial Skills Coping Strategies;Habits;Routines and Behaviors;Interpersonal Interaction           Patient will benefit from skilled therapeutic intervention in order to improve the following deficits and impairments:   Body Structure / Function / Physical Skills: ADL Cognitive Skills: Attention, Emotional, Energy/Drive, Problem Solve, Safety Awareness, Thought Psychosocial Skills: Coping Strategies, Habits, Routines and Behaviors, Interpersonal Interaction   Visit Diagnosis: Difficulty coping  Frontal lobe and executive function deficit  Severe episode of recurrent major depressive disorder, without psychotic features [facility])    Problem List Patient Active Problem List   Diagnosis Date Noted  . Autistic spectrum disorder 02/11/2020  . MDD (major depressive disorder), recurrent episode, severe ([place]) 02/08/2020  . MDD (major depressive disorder), recurrent, severe, with psychosis ([place]) 02/07/2020  . 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 ADHD (attention deficit hyperactivity disorder), combined type 01/25/2016  . Generalized anxiety disorder 01/25/2016  . Dysgraphia 01/25/2016    Ponciano Ort, MOT, OTR/L  03/23/2020, 12:36 PM  [facility] HOSPITALIZATION PROGRAM [place], [place], [address] Phone: [phone]   Fax:  [phone]  Name:  Michael [name] MRN: [id number] Date of Birth: October 19, 1990

## 2020-03-25 ENCOUNTER — Other Ambulatory Visit: Payer: Self-pay | Admitting: Psychiatry

## 2020-03-26 ENCOUNTER — Other Ambulatory Visit (HOSPITAL_COMMUNITY): Payer: BC Managed Care – PPO | Admitting: Licensed Clinical Social Worker

## 2020-03-26 ENCOUNTER — Other Ambulatory Visit: Payer: Self-pay

## 2020-03-26 ENCOUNTER — Other Ambulatory Visit (HOSPITAL_COMMUNITY): Payer: BC Managed Care – PPO | Admitting: Occupational Therapy

## 2020-03-26 ENCOUNTER — Encounter (HOSPITAL_COMMUNITY): Payer: Self-pay

## 2020-03-26 DIAGNOSIS — F840 Autistic disorder: Secondary | ICD-10-CM

## 2020-03-26 DIAGNOSIS — F332 Major depressive disorder, recurrent severe without psychotic features: Secondary | ICD-10-CM

## 2020-03-26 DIAGNOSIS — R4589 Other symptoms and signs involving emotional state: Secondary | ICD-10-CM

## 2020-03-26 DIAGNOSIS — R41844 Frontal lobe and executive function deficit: Secondary | ICD-10-CM

## 2020-03-26 DIAGNOSIS — F411 Generalized anxiety disorder: Secondary | ICD-10-CM

## 2020-03-26 NOTE — Therapy (Signed)
[facility] PARTIAL HOSPITALIZATION PROGRAM [address] [place], [place], [address] Phone: [phone]   Fax:  [phone] Virtual Visit via Video Note  I connected with Michael [name] on 03/26/20 at  11:00 AM EDT by a video enabled telemedicine application and verified that I am speaking with the correct person using two identifiers.   I discussed the limitations of evaluation and management by telemedicine and the availability of in person appointments. The patient expressed understanding and agreed to proceed.  I discussed the assessment and treatment plan with the patient. The patient was provided an opportunity to ask questions and all were answered. The patient agreed with the plan and demonstrated an understanding of the instructions.   The patient was advised to call back or seek an in-person evaluation if the symptoms worsen or if the condition fails to improve as anticipated.  I provided 60 minutes of non-face-to-face time during this encounter.  Occupational Therapy Treatment  Patient Details  Name: Michael [name] MRN: [id number] Date of Birth: 05/02/1991 Referring Provider (OT): Hillery Jacks   Encounter Date: 03/26/2020   OT End of Session - 03/26/20 1320    Visit Number 10    Number of Visits 20    Date for OT Re-Evaluation 04/10/20    Authorization Type BCBS    Authorization - Number of Visits 30    OT Start Time 1100    OT Stop Time 1200    OT Time Calculation (min) 60 min    Activity Tolerance Patient tolerated treatment well    Behavior During Therapy WFL for tasks assessed/performed           Past Medical History:  Diagnosis Date  . ADHD (attention deficit hyperactivity disorder)   . Autism     History reviewed. No pertinent surgical history.  There were no vitals filed for this visit.   Subjective Assessment - 03/26/20 1320    Currently in Pain? No/denies            OT Education - 03/26/20 1320     Education Details Educated on strategies/tips to overcome procrastination and reviewed goal-setting and how to create SMART goals    Person(s) Educated Patient    Methods Explanation;Handout    Comprehension Verbalized understanding            OT Short Term Goals - 03/14/20 1426      OT SHORT TERM GOAL #1   Status On-going      OT SHORT TERM GOAL #2   Status On-going      OT SHORT TERM GOAL #3   Status On-going      OT SHORT TERM GOAL #4   Status On-going         Group Session:  S: "I use if, then statements to make goals. If I procrastinate, it is because I do not have the energy."  O: Today's group session encouraged engagement and participation through discussion focused on procrastination and goal-setting. Group reviewed common themes/phrases we often say to ourselves to validate our procrastination and looked at strategies to combat this procrastination and lack of energy/motivation. Following discussion on procrastination, group members were introduced to goal-setting using the SMART Goal framework, identifying goals as Specific, Measureable, Acheivable, Relevant, and Time-Bound. Group members took time from group to create their own personal goal reflecting the SMART goal template and shared for review by peers and OT.    A: Michael [name] was active and receptive to min  cues for participation in group discussion. Pt shared that when she procrastinates, it is usually the result of not having enough energy or motivation, however reports holding herself accountable by utilizing "if then" statements. Pt reports that IF she does task A, then she can do task B. Utilizing SMART goal framework, pt identified goal "If I write this script, then I can record and if I record, then I can upload the video." Pt appeared receptive to feedback from OT, suggesting pt further break down goals into shorter, more attainable goals, suggesting "If I write this script, then I can record" and suggested a  relative time frame. Pt shared she usually 'hyperf ocuses' on a task in order to achieve it, noting it was due to her autism, however was receptive to suggestion from OT of maintaining a time frame to do so (only allot 2-3 hours to this task, instead of 5+). Overall, appeared receptive to feedback on goal-setting and making these adjustments.   P: Continue to attend PHP OT group sessions 5x week for 2 weeks to promote daily structure, social engagement, and opportunities to develop and utilize adaptive strategies to maximize functional performance in preparation for safe transition and integration back into school, work, and the community. Plan to address topic of self-esteem in next OT group session.    Plan - 03/26/20 1320    Occupational performance deficits (Please refer to evaluation for details): ADL's;IADL's;Rest and Sleep;Education;Leisure;Social Participation    Body Structure / Function / Physical Skills ADL    Cognitive Skills Attention;Emotional;Energy/Drive;Problem Solve;Safety Awareness;Thought    Psychosocial Skills Coping Strategies;Habits;Routines and Behaviors;Interpersonal Interaction           Patient will benefit from skilled therapeutic intervention in order to improve the following deficits and impairments:   Body Structure / Function / Physical Skills: ADL Cognitive Skills: Attention, Emotional, Energy/Drive, Problem Solve, Safety Awareness, Thought Psychosocial Skills: Coping Strategies, Habits, Routines and Behaviors, Interpersonal Interaction   Visit Diagnosis: Difficulty coping  Frontal lobe and executive function deficit  Severe episode of recurrent major depressive disorder, without psychotic features [facility])    Problem List Patient Active Problem List   Diagnosis Date Noted  . Autistic spectrum disorder 02/11/2020  . MDD (major depressive disorder), recurrent episode, severe ([place]) 02/08/2020  . MDD (major depressive disorder), recurrent, severe, with  psychosis ([place]) 02/07/2020  . 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 ADHD (attention deficit hyperactivity disorder), combined type 01/25/2016  . Generalized anxiety disorder 01/25/2016  . Dysgraphia 01/25/2016    Ponciano Ort, MOT, OTR/L  03/26/2020, 1:21 PM  [facility] HOSPITALIZATION PROGRAM [place], [place], [address] Phone: [phone]   Fax:  [phone]  Name: Michael [name] MRN: [id number] Date of Birth: Dec 11, 1990

## 2020-03-27 ENCOUNTER — Other Ambulatory Visit (HOSPITAL_COMMUNITY): Payer: BC Managed Care – PPO | Admitting: Licensed Clinical Social Worker

## 2020-03-27 ENCOUNTER — Telehealth (HOSPITAL_COMMUNITY): Payer: Self-pay | Admitting: Psychiatry

## 2020-03-27 ENCOUNTER — Encounter (HOSPITAL_COMMUNITY): Payer: Self-pay

## 2020-03-27 ENCOUNTER — Other Ambulatory Visit: Payer: Self-pay

## 2020-03-27 ENCOUNTER — Other Ambulatory Visit (HOSPITAL_COMMUNITY): Payer: BC Managed Care – PPO | Admitting: Occupational Therapy

## 2020-03-27 DIAGNOSIS — F332 Major depressive disorder, recurrent severe without psychotic features: Secondary | ICD-10-CM

## 2020-03-27 DIAGNOSIS — R41844 Frontal lobe and executive function deficit: Secondary | ICD-10-CM

## 2020-03-27 DIAGNOSIS — R4589 Other symptoms and signs involving emotional state: Secondary | ICD-10-CM

## 2020-03-27 DIAGNOSIS — F411 Generalized anxiety disorder: Secondary | ICD-10-CM

## 2020-03-27 DIAGNOSIS — F840 Autistic disorder: Secondary | ICD-10-CM

## 2020-03-27 NOTE — Therapy (Signed)
[facility] PARTIAL HOSPITALIZATION PROGRAM [address] [place], [place], [address] Phone: [phone]   Fax:  [phone] Virtual Visit via Video Note  I connected with Michael [name] on 03/27/20 at  11:00 AM EDT by a video enabled telemedicine application and verified that I am speaking with the correct person using two identifiers.   I discussed the limitations of evaluation and management by telemedicine and the availability of in person appointments. The patient expressed understanding and agreed to proceed.   I discussed the assessment and treatment plan with the patient. The patient was provided an opportunity to ask questions and all were answered. The patient agreed with the plan and demonstrated an understanding of the instructions.   The patient was advised to call back or seek an in-person evaluation if the symptoms worsen or if the condition fails to improve as anticipated.  I provided 60 minutes of non-face-to-face time during this encounter.   Occupational Therapy Treatment  Patient Details  Name: Michael [name] MRN: [id number] Date of Birth: 1991-07-03 Referring Provider (OT): Hillery Jacks   Encounter Date: 03/27/2020   OT End of Session - 03/27/20 1216    Visit Number 11    Number of Visits 20    Date for OT Re-Evaluation 04/10/20    Authorization Type BCBS    Authorization - Number of Visits 30    OT Start Time 1105    OT Stop Time 1205    OT Time Calculation (min) 60 min    Activity Tolerance Patient tolerated treatment well    Behavior During Therapy WFL for tasks assessed/performed;Flat affect           Past Medical History:  Diagnosis Date  . ADHD (attention deficit hyperactivity disorder)   . Autism     History reviewed. No pertinent surgical history.  There were no vitals filed for this visit.   Subjective Assessment - 03/27/20 1216    Currently in Pain? No/denies             OT  Education - 03/27/20 1216    Education Details Educated on low vs high self-esteem, along with strategies to improve our self-worth with use of positive affirmations    Person(s) Educated Patient    Methods Explanation;Handout    Comprehension Verbalized understanding            OT Short Term Goals - 03/14/20 1426      OT SHORT TERM GOAL #1   Status On-going      OT SHORT TERM GOAL #2   Status On-going      OT SHORT TERM GOAL #3   Status On-going      OT SHORT TERM GOAL #4   Status On-going         Group Session:  S: In response to low self-esteem "It can come out in a situation where we make a mistake and we feel bad about ourselves because of it."  O: Today's group session encouraged increased engagement and participation through discussion focused on self-esteem. Topic of discussion focused on identifying differences between low vs high self-esteem and identified ways in which our self-esteem impacts our daily lives including: self-criticism, ignoring positive qualities, negative emotions, work/school performance, relationships, leisure engagement, and self-care. Group members further identified ways in which their self-esteem directly impacts their daily function. Tips and strategies were shared to boost and build-up our low self-esteem, with a strong focus on positive affirmations and self-talk. Group  members identified which positive affirmations stood out most meaningfully to them and shared with the group.   A: [name] appeared attentive in listening to peers throughout discussion though often required min-moderate cues for verbal engagement and appeared somewhat withdrawn and flat in her affect. Pt shared that self-esteem can present itself in situations where someone makes a mistake, as noted by feeling bad about ourselves. Pt did not specifically identify a positive affirmation that stood out to her, though shared she agreed with what her peers had shared.    P: Continue to  attend PHP OT group sessions 5x week for 2 weeks to promote daily structure, social engagement, and opportunities to develop and utilize adaptive strategies to maximize functional performance in preparation for safe transition and integration back into school, work, and the community. Plan to address topic of identifying triggers in next OT group session.    Plan - 03/27/20 1217    Occupational performance deficits (Please refer to evaluation for details): ADL's;IADL's;Rest and Sleep;Education;Leisure;Social Participation    Body Structure / Function / Physical Skills ADL    Cognitive Skills Attention;Emotional;Energy/Drive;Problem Solve;Safety Awareness;Thought    Psychosocial Skills Coping Strategies;Habits;Routines and Behaviors;Interpersonal Interaction           Patient will benefit from skilled therapeutic intervention in order to improve the following deficits and impairments:   Body Structure / Function / Physical Skills: ADL Cognitive Skills: Attention, Emotional, Energy/Drive, Problem Solve, Safety Awareness, Thought Psychosocial Skills: Coping Strategies, Habits, Routines and Behaviors, Interpersonal Interaction   Visit Diagnosis: Difficulty coping  Frontal lobe and executive function deficit  Severe episode of recurrent major depressive disorder, without psychotic features [facility])    Problem List Patient Active Problem List   Diagnosis Date Noted  . Autistic spectrum disorder 02/11/2020  . MDD (major depressive disorder), recurrent episode, severe ([place]) 02/08/2020  . MDD (major depressive disorder), recurrent, severe, with psychosis ([place]) 02/07/2020  . 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 ADHD (attention deficit hyperactivity disorder), combined type 01/25/2016  . Generalized anxiety disorder 01/25/2016  . Dysgraphia 01/25/2016    Ponciano Ort, MOT, OTR/L  03/27/2020, 12:18 PM  [facility] HOSPITALIZATION PROGRAM [place], [place], [address] Phone: [phone]   Fax:  [phone]  Name: Michael [name] MRN: [id number] Date of Birth: November 04, 1990

## 2020-03-27 NOTE — Progress Notes (Signed)
Spoke with patient via Webex video call, used 2 identifiers to correctly identify patient. States that groups are going well and helpful. No side effects from medication, auditory hallucinations are gone since increasing Abilify. No visual hallucinations. Denies SI/HI. On scale 1-10 as 10 being worst rates depression at 4 and anxiety at 5. No issues or complaints.

## 2020-03-27 NOTE — Telephone Encounter (Signed)
D:  Patient will be transitioned to [facility] from [facility] on 04-03-20.  A:  Placed call to orient patient.  Encouraged pt to verify his benefits.  R:  Pt receptive.

## 2020-03-28 ENCOUNTER — Encounter (HOSPITAL_COMMUNITY): Payer: Self-pay

## 2020-03-28 ENCOUNTER — Other Ambulatory Visit (HOSPITAL_COMMUNITY): Payer: BC Managed Care – PPO | Admitting: Occupational Therapy

## 2020-03-28 ENCOUNTER — Other Ambulatory Visit (HOSPITAL_COMMUNITY): Payer: BC Managed Care – PPO | Admitting: Professional

## 2020-03-28 ENCOUNTER — Other Ambulatory Visit: Payer: Self-pay

## 2020-03-28 DIAGNOSIS — Z789 Other specified health status: Secondary | ICD-10-CM

## 2020-03-28 DIAGNOSIS — R4589 Other symptoms and signs involving emotional state: Secondary | ICD-10-CM

## 2020-03-28 DIAGNOSIS — F840 Autistic disorder: Secondary | ICD-10-CM

## 2020-03-28 DIAGNOSIS — F411 Generalized anxiety disorder: Secondary | ICD-10-CM

## 2020-03-28 DIAGNOSIS — F332 Major depressive disorder, recurrent severe without psychotic features: Secondary | ICD-10-CM | POA: Diagnosis not present

## 2020-03-28 DIAGNOSIS — R41844 Frontal lobe and executive function deficit: Secondary | ICD-10-CM

## 2020-03-28 NOTE — Therapy (Signed)
[place], [place], [address] Phone: [phone]   Fax:  [phone] Virtual Visit via Video Note  I connected with Michael [name] on 03/28/20 at  12:00 PM EDT by a video enabled telemedicine application and verified that I am speaking with the correct person using two identifiers.   I discussed the limitations of evaluation and management by telemedicine and the availability of in person appointments. The patient expressed understanding and agreed to proceed.   I discussed the assessment and treatment plan with the patient. The patient was provided an opportunity to ask questions and all were answered. The patient agreed with the plan and demonstrated an understanding of the instructions.   The patient was advised to call back or seek an in-person evaluation if the symptoms worsen or if the condition fails to improve as anticipated.  I provided 50 minutes of non-face-to-face time during this encounter.   Occupational Therapy Treatment  Patient Details  Name: Michael [name] MRN: [id number] Date of Birth: 10/23/1990 Referring Provider (OT): Ricky Ala   Encounter Date: 03/28/2020   OT End of Session - 03/28/20 1327    Visit Number 12    Number of Visits 20    Date for OT Re-Evaluation 04/10/20    Authorization Type BCBS    Authorization - Number of Visits 30    OT Start Time 1200    OT Stop Time 1250    OT Time Calculation (min) 50 min    Activity Tolerance Patient tolerated treatment well;Patient limited by fatigue    Behavior During Therapy [facility] for tasks assessed/performed;Flat affect           Past Medical History:  Diagnosis Date  . ADHD (attention deficit hyperactivity disorder)   . Autism     History reviewed. No pertinent surgical history.  There were no vitals filed for this visit.   Subjective Assessment - 03/28/20 1326    Currently in Pain? No/denies              OT Education - 03/28/20 1326    Education Details Educated on different types of social support and reviewed strategies/tips to improve and build upon supports identified    Person(s) Educated Patient    Methods Explanation;Handout    Comprehension Verbalized understanding;Verbal cues required            OT Short Term Goals - 03/14/20 1426      OT SHORT TERM GOAL #1   Status On-going      OT SHORT TERM GOAL #2   Status On-going      OT SHORT TERM GOAL #3   Status On-going      OT SHORT TERM GOAL #4   Status On-going         Group Session:  S: None noted*  O: Group began with a check-in and review of self-esteem and members shared whether they practiced or utilized their positive affirmations identified from previous OT session. Today's group session focused on topic of social supports. Group members started off by completing a self-assessment to identify their current level of social support by answering a series of true/false questions. Discussion focused on reviewing group members' results and discussed the different types of social support we seek out including emotional support, social support, informational support, and tangible/practical support. Members identified which areas of support they felt most unsupported in and reviewed strategies and tips to build our support networks.  A: Michael [name] appeared attentive, though minimally engaged in group discussion and activity, despite moderate cues for verbal engagement. Pt nodded along in agreement with peers at times and appeared to engage in written portion of activity/discussion, however declined to share with the group. Pt presented with flat affect and appeared tired/withdrawn at times. Pt did not verbally identify any social supports, however nodded in agreement that she does not have any current concerns or difficulty in expanding her social network. Appeared somewhat receptive to education and strategies on  building social supports and connections within the community and outside of PHP programming.   P: Continue to attend PHP OT group sessions 5x week for 1 week to promote daily structure, social engagement, and opportunities to develop and utilize adaptive strategies to maximize functional performance in preparation for safe transition and integration back into school, work, and the community.     Plan - 03/28/20 1327    Occupational performance deficits (Please refer to evaluation for details): ADL's;IADL's;Rest and Sleep;Education;Leisure;Social Participation    Body Structure / Function / Physical Skills ADL    Cognitive Skills Attention;Emotional;Energy/Drive;Problem Solve;Safety Awareness;Thought    Psychosocial Skills Coping Strategies;Habits;Routines and Behaviors;Interpersonal Interaction           Patient will benefit from skilled therapeutic intervention in order to improve the following deficits and impairments:   Body Structure / Function / Physical Skills: ADL Cognitive Skills: Attention, Emotional, Energy/Drive, Problem Solve, Safety Awareness, Thought Psychosocial Skills: Coping Strategies, Habits, Routines and Behaviors, Interpersonal Interaction   Visit Diagnosis: Difficulty coping  Frontal lobe and executive function deficit  Severe episode of recurrent major depressive disorder, without psychotic features [facility])    Problem List Patient Active Problem List   Diagnosis Date Noted  . Autistic spectrum disorder 02/11/2020  . MDD (major depressive disorder), recurrent episode, severe ([facility]) 02/08/2020  . MDD (major depressive disorder), recurrent, severe, with psychosis ([facility]) 02/07/2020  . 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 ADHD (attention deficit hyperactivity disorder), combined type 01/25/2016  . Generalized anxiety disorder 01/25/2016  . Dysgraphia 01/25/2016    [name], MOT, OTR/L  03/28/2020, 1:28 PM  [facility] HOSPITALIZATION PROGRAM [address] [place], [place], [address] Phone: [phone]   Fax:  [phone]  Name: Michael [name] MRN: [id number] Date of Birth: 10/26/1990

## 2020-03-28 NOTE — Progress Notes (Signed)
Pt attended spiritual care group 03/28/2020 11:00-12:00.  Group met via web-ex due to COVID-19 precautions.  Group facilitated by Simone Curia, MDiv, [place]   Group focused on topic of "community."  Members reflected on topic in facilitated dialog, identifying responses to topic and notions they hold of community from their previous experience.  Group members utilized value sort cards to identify top qualities they look for in community.  Engaged in facilitated dialog around their value choices, noting origin of these values, how these are realized in their lives, and strategies for engaging these values.    Spiritual care group drew on Motivational Interviewing, Narrative and Adlerian modalities  Present throughout group.  Attentive to group discussion.  Michael [name] was working on a   Conservation officer, nature did not participate in group conversation around [employer] - Two other group members spoke for majority of group time.

## 2020-03-28 NOTE — Psych (Signed)
Virtual Visit via Video Note  I connected with Michael [name] on 03/15/20 at  9:00 AM EDT by a video enabled telemedicine application and verified that I am speaking with the correct person using two identifiers.   I discussed the limitations of evaluation and management by telemedicine and the availability of in person appointments. The patient expressed understanding and agreed to proceed.  I discussed the assessment and treatment plan with the patient. The patient was provided an opportunity to ask questions and all were answered. The patient agreed with the plan and demonstrated an understanding of the instructions.   The patient was advised to call back or seek an in-person evaluation if the symptoms worsen or if the condition fails to improve as anticipated.  Pt was provided 240 minutes of non-face-to-face time during this encounter.   Michael Guiles, LCSW    [facility] PHP THERAPIST PROGRESS NOTE  Michael [name] [id number]  Session Time: 9:00 - 10:00  Participation Level: Active  Behavioral Response: CasualAlertDepressed  Type of Therapy: Group Therapy  Treatment Goals addressed: Coping  Interventions: CBT, DBT, Supportive and Reframing  Summary: Clinician led check-in regarding current stressors and situation, and review of patient completed daily inventory. Clinician utilized active listening and empathetic response and validated patient emotions. Clinician facilitated processing group on pertinent issues.   Therapist Response:Michael [name] "[name]" is a 29 y.o. adult who presents with depression and anxiety symptoms. Patient arrived within time allowed and reports that she is feeling "tired." Patient rates her mood at a 6 on a scale of 1-10 with 10 being great. Pt reports struggling with adapting her sleep schedule for group. Pt states she spent the afternoon relaxing and playing video games. Pt reports struggling with rumination. Pt engaged in  discussion.        Session Time: 10:00-11:00  Participation Level:Active  Behavioral Response:CasualAlertDepressed  Type of Therapy:Group Therapy  Treatment Goals addressed: Coping  Interventions:CBT, DBT, Solution Focused, Supportive and Reframing  Summary: Cln led discussion on feeling lost and how to find "me" again. Group members shared struggles they are having with their identity amidst mental health issues. Cln encouraged pt's to try to spend 5 minutes a day doing new activities to seek joy and see what sticks.   Therapist Response:  Pt engaged in discussion and reports feeling like they do not know who they are other than being depressed and struggling with gender dysphoria. Pt able to process.        Session Time: 11:00- 12:00  Participation Level:Active  Behavioral Response:CasualAlertDepressed  Type of Therapy: Group Therapy, OT  Treatment Goals addressed: Coping  Interventions:Psychosocial skills training, Supportive  Summary:Occupational Therapy group  Therapist Response:Patient engaged in group. See OT note.         Session Time: 12:00 -1:00  Participation Level:Active  Behavioral Response:CasualAlertDepressed  Type of Therapy:Group therapy  Treatment Goals addressed: Coping  Interventions:CBT; Solution focused; Supportive; Reframing  Summary:12:00 - 12:50: Cln continued topic of boundaries. Group discussed boundary categories, how to set a boundary, and the common barriers to setting or keeping healthy boundaries.  12:50 -1:00 Clinician led check-out. Clinician assessed for immediate needs, medication compliance and efficacy, and safety concerns  Therapist Response:12:00 - 12:50: Pt engaged in discussion and reports understanding of how to alter their approach with boundaries to see improvement in relationships.   12:50 - 1:00: At check-out, patientrates hermood at a6 on a scale of 1-10  with 10 being great.Pt states afternoon plans of napping.Patient demonstratessomeprogress  as evidenced by participating in distractions.Patient denies SI/HI/self-harm at the end of group.    Suicidal/Homicidal: Nowithout intent/plan  Plan: Pt will continue in PHP while working to decrease depression and anxiety symptoms, increase ability to manage symptoms in a healthy manner, and increase prosocial activities.    Diagnosis: Severe episode of recurrent major depressive disorder, without psychotic features ([place]) [F33.2]    1. Severe episode of recurrent major depressive disorder, without psychotic features ([place])   2. Generalized anxiety disorder   3. Autistic spectrum disorder       Michael Glass, LCSW 03/28/2020

## 2020-03-28 NOTE — Psych (Signed)
Virtual Visit via Video Note  I connected with Michael [name] on 03/19/20 at  9:00 AM EDT by a video enabled telemedicine application and verified that I am speaking with the correct person using two identifiers.   I discussed the limitations of evaluation and management by telemedicine and the availability of in person appointments. The patient expressed understanding and agreed to proceed.  I discussed the assessment and treatment plan with the patient. The patient was provided an opportunity to ask questions and all were answered. The patient agreed with the plan and demonstrated an understanding of the instructions.   The patient was advised to call back or seek an in-person evaluation if the symptoms worsen or if the condition fails to improve as anticipated.  Pt was provided 240 minutes of non-face-to-face time during this encounter.   [name], LCSW    [facility] PHP THERAPIST PROGRESS NOTE  Michael [name] [id number]  Session Time: 9:00 - 10:00  Participation Level: Active  Behavioral Response: CasualAlertDepressed  Type of Therapy: Group Therapy  Treatment Goals addressed: Coping  Interventions: CBT, DBT, Supportive and Reframing  Summary: Clinician led check-in regarding current stressors and situation, and review of patient completed daily inventory. Clinician utilized active listening and empathetic response and validated patient emotions. Clinician facilitated processing group on pertinent issues.   Therapist Response:Michael M Ricke "[name]" is a 29 y.o. adult who presents with depression and anxiety symptoms. Patient arrived within time allowed and reports that she is feeling "fairly well." Patient rates her mood at a 7 on a scale of 1-10 with 10 being great. Pt she is not hearing "voices" at all and that she spent all weekend studying Sanskrit. Pt states appetite is improving and she slept well. Pt struggles with opening up in group. Pt engaged  in discussion.        Session Time: 10:00-11:00  Participation Level:Active  Behavioral Response:CasualAlertDepressed  Type of Therapy:Group Therapy  Treatment Goals addressed: Coping  Interventions:CBT, DBT, Solution Focused, Supportive and Reframing  Summary: Cln led discussion on personal responsibility. Group members shared ways in which insecurity and difficulty trusting others lead to taking more responsibility than appropriate as a way to protect  themselves. Cln synthesized aspects of CBT, boundaries, and self-esteem to direct conversation.   Therapist Response:  Pt engaged in discussion and reports difficulty with understanding when to trust or not trust someone. Pt able to process and support other group members.       Session Time: 11:00- 12:00  Participation Level:Active  Behavioral Response:CasualAlertDepressed  Type of Therapy:Group Therapy  Treatment Goals addressed: Coping  Interventions:CBT, DBT, Solution Focused, Supportive and Reframing  Summary: Cln introduced CBT's cognitive triangle and the connection between thoughts, feelings, and actions. Cln introduced cognitive distortions, the irrtational thought patterns that impair our ability to improve our cognitive triangles. Group members shared ways in which they struggle to recognize the difference between thoughts, feelings, and actions.   Therapist Response:  Pt engaged in discussion and reports understanding of the cognitive triangle and how distortions may be blurring their reality.      Session Time: 12:00 -1:00  Participation Level:Active  Behavioral Response:CasualAlertDepressed  Type of Therapy:Group therapy  Treatment Goals addressed: Coping  Interventions:CBT; Solution focused; Supportive; Reframing  Type of Therapy: Group Therapy, OT  Treatment Goals addressed: Coping  Interventions:Psychosocial skills training,  Supportive  Summary:12:00 - 12:50: Occupational Therapy group 12:50 -1:00 Clinician led check-out. Clinician assessed for immediate needs, medication compliance and efficacy, and safety concerns  Therapist Response:Patient engaged in group. See OT note.  12:50 - 1:00: At check-out, patientrates hermood at [facility] on a scale of 1-10 with 10 being great.Pt states afternoon plans of studying Sanskrit.Patient demonstratessomeprogress as evidenced by improved appetite.Patient denies SI/HI/self-harm at the end of group.    Suicidal/Homicidal: Nowithout intent/plan  Plan: Pt will continue in PHP while working to decrease depression and anxiety symptoms, increase ability to manage symptoms in a healthy manner, and increase prosocial activities.    Diagnosis: Severe episode of recurrent major depressive disorder, without psychotic features ([place]) [F33.2]    1. Severe episode of recurrent major depressive disorder, without psychotic features ([place])   2. Generalized anxiety disorder   3. Autistic spectrum disorder       [name], LCSW 03/28/2020

## 2020-03-28 NOTE — Psych (Signed)
Virtual Visit via Video Note  I connected with Michael [name] on 03/13/20 at  9:00 AM EDT by a video enabled telemedicine application and verified that I am speaking with the correct person using two identifiers.   I discussed the limitations of evaluation and management by telemedicine and the availability of in person appointments. The patient expressed understanding and agreed to proceed.  I discussed the assessment and treatment plan with the patient. The patient was provided an opportunity to ask questions and all were answered. The patient agreed with the plan and demonstrated an understanding of the instructions.   The patient was advised to call back or seek an in-person evaluation if the symptoms worsen or if the condition fails to improve as anticipated.  Pt was provided 240 minutes of non-face-to-face time during this encounter.   [name], LCSW    [facility] PHP THERAPIST PROGRESS NOTE  Michael [name] [id number]  Session Time: 9:00 - 10:00  Participation Level: Active  Behavioral Response: CasualAlertDepressed  Type of Therapy: Group Therapy  Treatment Goals addressed: Coping  Interventions: CBT, DBT, Supportive and Reframing  Summary: Clinician led check-in regarding current stressors and situation, and review of patient completed daily inventory. Clinician utilized active listening and empathetic response and validated patient emotions. Clinician facilitated processing group on pertinent issues.   Therapist Response:Michael [name] "[name]" is a 29 y.o. adult who presents with depression and anxiety symptoms. Patient arrived within time allowed and reports that she is feeling "okay." Patient rates her mood at a 6 on a scale of 1-10 with 10 being great. Pt reports she had a mixed weekend, experiencing passive SI on Saturday and a good day on Monday seeing a friend. Pt states she increased her medication on her own on Sunday, doubling her Abilify.  Pt states she will call her psychiatrist today to inform them of the change. Pt reports she is seeing benefit from the medication change and her mood is improving. Pt reports struggling with appetite. Pt engaged in discussion.       Session Time: 10:00-11:00  Participation Level:Active  Behavioral Response:CasualAlertDepressed  Type of Therapy:Group Therapy  Treatment Goals addressed: Coping  Interventions:CBT, DBT, Solution Focused, Supportive and Reframing  Summary: Cln led discussion on giving when we are exhausted. Group members shared feelings they have when they are unable to support or give to others. Pt's shared ways they struggle when they are feeling depleted and are used to being the one who handles things in their life.   Therapist Response: Pt engaged in discussion and reports feeling guilty when she sets limits with others.       Session Time: 11:00- 12:00  Participation Level:Active  Behavioral Response:CasualAlertDepressed  Type of Therapy: Group Therapy, OT  Treatment Goals addressed: Coping  Interventions:Psychosocial skills training, Supportive  Summary:Occupational Therapy group  Therapist Response:Patient engaged in group. See OT note.       Session Time: 12:00 -1:00  Participation Level:Active  Behavioral Response:CasualAlertDepressed  Type of Therapy:Group therapy  Treatment Goals addressed: Coping  Interventions:CBT; Solution focused; Supportive; Reframing  Summary:12:00 - 12:50: Cln introduced topic of boundaries. Group members discussed porous, rigid, and healthy boundary characteristics and identified which characteristics they related to and how this affects their relationships.  12:50 -1:00 Clinician led check-out. Clinician assessed for immediate needs, medication compliance and efficacy, and safety concerns  Therapist Response:12:00 - 12:50: Pt engaged in discussion and reports  having predominately porous boundaries.  12:50 - 1:00: At check-out, patientrates hermood  at a6 on a scale of 1-10 with 10 being great.Pt states afternoon plans of researching and writing scripts.Patient demonstratessomeprogress as evidenced by participation in first group.Patient denies SI/HI/self-harm at the end of group.    Suicidal/Homicidal: Nowithout intent/plan  Plan: Pt will continue in PHP while working to decrease depression and anxiety symptoms, increase ability to manage symptoms in a healthy manner, and increase prosocial activities.    Diagnosis: Severe episode of recurrent major depressive disorder, without psychotic features (HCC) [F33.2]    1. Severe episode of recurrent major depressive disorder, without psychotic features (HCC)   2. Generalized anxiety disorder   3. Autistic spectrum disorder       Michael Guiles, LCSW 03/28/2020

## 2020-03-28 NOTE — Progress Notes (Signed)
Virtual Visit via Video Note  I connected with Michael [name] on 03/28/20 at  9:00 AM EDT by a video enabled telemedicine application and verified that I am speaking with the correct person using two identifiers.   I discussed the limitations of evaluation and management by telemedicine and the availability of in person appointments. The patient expressed understanding and agreed to proceed.     I discussed the assessment and treatment plan with the patient. The patient was provided an opportunity to ask questions and all were answered. The patient agreed with the plan and demonstrated an understanding of the instructions.   The patient was advised to call back or seek an in-person evaluation if the symptoms worsen or if the condition fails to improve as anticipated.  I provided 15 minutes of non-face-to-face time during this encounter.   Maryagnes Amos, FNP   BH MD/PA/NP OP Progress Note  03/28/2020 10:19 AM Michael [name]  MRN:  [id number]  Michael [name] Was seen and evaluated via video WebEx.  She presents as anxious and guarded, with poor eye contact noted.  She did make an attempt to engage well with provider however no eye contact was made.  During the evaluation she reported she was practicing knitting and experimenting with fabric.  Writer made an attempt to discuss patient goa with her, however she does not appear to be on board at this time.  She reports her next goal is to transition from intensive outpatient to individual therapy.  She currently rates her depression and anxiety at 3 out of 10 concurrently with 10 being the worst on a Likert scale.  At this time we are anticipating discharging partial hospitalization program on Monday with ongoing therapy through intensive outpatient starting on Tuesday.  She reports compliance with her current medications to include Zoloft 100, BuSpar 30, Abilify 10, evekeo 10.  She denies any need for refills at this  time..  She denies any medication side effects during this assessment.  She does note: Denies she is needing refills on her testosterone blocker and states she needs to contact her endocrinologist.  Patient she also reports that she is not having any current suicidal ideation, homicidal ideation, and or hallucinations.  Support, encouragement, and reassurance was provided.   Visit Diagnosis:    ICD-10-CM   1. Severe episode of recurrent major depressive disorder, without psychotic features (HCC)  F33.2   2. Generalized anxiety disorder  F41.1   3. Male-to-male transgender person  F61.0     Past Psychiatric History:   Past Medical History:  Past Medical History:  Diagnosis Date  . ADHD (attention deficit hyperactivity disorder)   . Autism    No past surgical history on file.  Family Psychiatric History:   Family History:  Family History  Problem Relation Age of Onset  . Asthma Mother   . Depression Mother   . Learning disabilities Mother   . ADD / ADHD Father   . Other Father        low testosterone  . Speech disorder Brother     Social History:  Social History   Socioeconomic History  . Marital status: Single    Spouse name: Not on file  . Number of children: Not on file  . Years of education: Not on file  . Highest education level: Not on file  Occupational History  . Not on file  Tobacco Use  . Smoking status: Never Smoker  . Smokeless tobacco: Never Used  Substance and Sexual Activity  . Alcohol use: Not on file  . Drug use: Not on file  . Sexual activity: Not on file  Other Topics Concern  . Not on file  Social History Narrative  . Not on file   Social Determinants of Health   Financial Resource Strain:   . Difficulty of Paying Living Expenses:   Food Insecurity:   . Worried About Charity fundraiser in the Last Year:   . Arboriculturist in the Last Year:   Transportation Needs:   . Film/video editor (Medical):   Marland Kitchen Lack of Transportation  (Non-Medical):   Physical Activity:   . Days of Exercise per Week:   . Minutes of Exercise per Session:   Stress:   . Feeling of Stress :   Social Connections:   . Frequency of Communication with Friends and Family:   . Frequency of Social Gatherings with Friends and Family:   . Attends Religious Services:   . Active Member of Clubs or Organizations:   . Attends Archivist Meetings:   Marland Kitchen Marital Status:     Allergies:  Allergies  Allergen Reactions  . Shellfish Allergy     Metabolic Disorder Labs: Lab Results  Component Value Date   HGBA1C 5.4 02/09/2020   MPG 108.28 02/09/2020   No results found for: PROLACTIN Lab Results  Component Value Date   CHOL 163 02/09/2020   TRIG 87 02/09/2020   HDL 59 02/09/2020   CHOLHDL 2.8 02/09/2020   VLDL 17 02/09/2020   LDLCALC 87 02/09/2020   Lab Results  Component Value Date   TSH 0.658 02/09/2020   TSH 0.98 08/26/2019    Therapeutic Level Labs: No results found for: LITHIUM No results found for: VALPROATE No components found for:  CBMZ  Current Medications: Current Outpatient Medications  Medication Sig Dispense Refill  . Amphetamine Sulfate (EVEKEO) 10 MG TABS Take 10 mg by mouth daily. 30 tablet 0  . ARIPiprazole (ABILIFY) 10 MG tablet Take 1 tablet (10 mg total) by mouth daily. 30 tablet 1  . busPIRone (BUSPAR) 30 MG tablet Take 1 tablet (30 mg total) by mouth 2 (two) times daily. 60 tablet 1  . Elagolix Sodium 150 MG TABS Take 150 mg by mouth daily. (Patient not taking: Reported on 03/15/2020) 30 tablet 11  . estradiol (ESTRACE) 2 MG tablet Take 1 tablet (2 mg total) by mouth daily. 90 tablet 3  . norethindrone (MICRONOR) 0.35 MG tablet TAKE 1 TABLET BY MOUTH EVERY DAY 84 tablet 2  . sertraline (ZOLOFT) 100 MG tablet Take 1.5 tablets (150 mg total) by mouth daily. 45 tablet 1   No current facility-administered medications for this visit.     Musculoskeletal:   Psychiatric Specialty Exam: Review of  Systems  Psychiatric/Behavioral: The patient is nervous/anxious.   All other systems reviewed and are negative.   There were no vitals taken for this visit.There is no height or weight on file to calculate BMI.  General Appearance: Casual  Eye Contact:  Poor  Speech:  Normal Rate  Volume:  Decreased  Mood:  Anxious and depressed "mild"   Affect:  Appropriate  Thought Process:  Linear and Descriptions of Associations: Intact  Orientation:  Full (Time, Place, and Person)  Thought Content: WDL   Suicidal Thoughts:  Denies  Homicidal Thoughts:  Denies  Memory:  Immediate;   Good Recent;   Good  Judgement:  Intact  Insight:  Fair  Psychomotor  Activity:  Normal  Concentration:  Concentration: Good and Attention Span: Good  Recall:  Good  Fund of Knowledge: Good  Language: Good  Akathisia:  Negative  Handed:  Right  AIMS (if indicated):   Assets:  Communication Skills Desire for Improvement Financial Resources/Insurance Resilience Social Support  ADL's:  Intact  Cognition: WNL  Sleep:  Good   Screenings: AUDIT     Admission (Discharged) from [facility]/08/[facility] in [facility] INPATIENT [facility]AL [facility]ICINE  Alcohol Use Disorder Identification Test Final Score (AUDIT) 0    PHQ2-9     Counselor from 03/15/2020 in BEHAVIORAL HEALTH PARTIAL HOSPITALIZATION PROGRAM  PHQ-2 Total Score 3  PHQ-9 Total Score 13       Assessment and Plan: At this time patient continues to improve from a program is from partial hospitalization today.  She is to continue her current medications that include Abilify 10 mg, BuSpar 30 mg, and Zoloft 100 mg, Evekeo 10mg  daily.  SHe is to follow-up with  patient endocrinologist regarding testosterone blockers and refills service As appropriate..  Treatment options and alternatives reviewed with patient and patient understands the plan above.  Treatment plan was reviewed and patient agreed upon by nurse practitioner and patient Caryn Bee Buckle need  for group services.   Stephanie Coup, FNP 03/28/2020, 10:19 AM

## 2020-03-28 NOTE — Psych (Signed)
Virtual Visit via Video Note  I connected with Michael [name] on 03/16/20 at  9:00 AM EDT by a video enabled telemedicine application and verified that I am speaking with the correct person using two identifiers.   I discussed the limitations of evaluation and management by telemedicine and the availability of in person appointments. The patient expressed understanding and agreed to proceed.  I discussed the assessment and treatment plan with the patient. The patient was provided an opportunity to ask questions and all were answered. The patient agreed with the plan and demonstrated an understanding of the instructions.   The patient was advised to call back or seek an in-person evaluation if the symptoms worsen or if the condition fails to improve as anticipated.  Pt was provided 240 minutes of non-face-to-face time during this encounter.   Michael Guiles, LCSW    [facility] PHP THERAPIST PROGRESS NOTE  Michael [name] [id number]  Session Time: 9:00 - 10:00  Participation Level: Active  Behavioral Response: CasualAlertDepressed  Type of Therapy: Group Therapy  Treatment Goals addressed: Coping  Interventions: CBT, DBT, Supportive and Reframing  Summary: Clinician led check-in regarding current stressors and situation, and review of patient completed daily inventory. Clinician utilized active listening and empathetic response and validated patient emotions. Clinician facilitated processing group on pertinent issues.   Therapist Response:Michael [name] "Michael [name]" is a 29 y.o. adult who presents with depression and anxiety symptoms. Patient arrived within time allowed and reports that she is feeling "good." Patient rates her mood at a 7 on a scale of 1-10 with 10 being great. Pt reports she put effort into her dress today and that is helping her mood. Pt states she spent yesterday studying sanskrit. Pt reports struggling with mood dependent thinking. Pt engaged in  discussion.        Session Time: 10:00-11:00  Participation Level:Active  Behavioral Response:CasualAlertDepressed  Type of Therapy:Group Therapy  Treatment Goals addressed: Coping  Interventions:CBT, DBT, Solution Focused, Supportive and Reframing  Summary: Cln led boundary workshop in which group members shared boundary struggles and group members worked together to apply boundary tenets and problem solve each issue. Cln cued pt's on boundary principles and maintaining healthy values.   Therapist Response:  Pt engaged in discussion and shared boundary issue with a community she was a part of. Pt is able to recieve and give support and reports clearer boundary goals by end of discussion.        Session Time: 11:00- 12:00  Participation Level:Active  Behavioral Response:CasualAlertDepressed  Type of Therapy: Group Therapy, OT  Treatment Goals addressed: Coping  Interventions:Psychosocial skills training, Supportive  Summary:Occupational Therapy group  Therapist Response:Patient engaged in group. See OT note.         Session Time: 12:00 -1:00  Participation Level:Active  Behavioral Response:CasualAlertDepressed  Type of Therapy:Group therapy  Treatment Goals addressed: Coping  Interventions:CBT; Solution focused; Supportive; Reframing  Summary:12:00 - 12:50: Cln led discussion on the role fun/joy/whimsy can play in recovery. Group viewed Cloretta Ned TED talk on using the mundane as a catalyst for whimsy and discussed how they can add fun into their daily "boring" tasks. 12:50 -1:00 Clinician led check-out. Clinician assessed for immediate needs, medication compliance and efficacy, and safety concerns  Therapist Response:12:00 - 12:50: Pt engaged in discussion and struggled to determine how to add fun or whimsy into her day.. 12:50 - 1:00: At check-out, patientrates hermood at [facility] on a scale of 1-10 with  10 being great.Pt states afternoon  plans of seeing a friend and taking a a walk.Patient demonstratessomeprogress as evidenced by increased distractions.Patient denies SI/HI/self-harm at the end of group.    Suicidal/Homicidal: Nowithout intent/plan  Plan: Pt will continue in PHP while working to decrease depression and anxiety symptoms, increase ability to manage symptoms in a healthy manner, and increase prosocial activities.    Diagnosis: Severe episode of recurrent major depressive disorder, without psychotic features ([place]) [F33.2]    1. Severe episode of recurrent major depressive disorder, without psychotic features ([place])   2. Generalized anxiety disorder   3. Autistic spectrum disorder       Michael Glass, LCSW 03/28/2020

## 2020-03-29 ENCOUNTER — Encounter (HOSPITAL_COMMUNITY): Payer: Self-pay

## 2020-03-29 ENCOUNTER — Other Ambulatory Visit (HOSPITAL_COMMUNITY): Payer: BC Managed Care – PPO | Admitting: Licensed Clinical Social Worker

## 2020-03-29 ENCOUNTER — Other Ambulatory Visit: Payer: Self-pay

## 2020-03-29 ENCOUNTER — Other Ambulatory Visit (HOSPITAL_COMMUNITY): Payer: BC Managed Care – PPO | Admitting: Occupational Therapy

## 2020-03-29 DIAGNOSIS — F332 Major depressive disorder, recurrent severe without psychotic features: Secondary | ICD-10-CM | POA: Diagnosis not present

## 2020-03-29 DIAGNOSIS — R41844 Frontal lobe and executive function deficit: Secondary | ICD-10-CM

## 2020-03-29 DIAGNOSIS — F840 Autistic disorder: Secondary | ICD-10-CM

## 2020-03-29 DIAGNOSIS — R4589 Other symptoms and signs involving emotional state: Secondary | ICD-10-CM

## 2020-03-29 DIAGNOSIS — F411 Generalized anxiety disorder: Secondary | ICD-10-CM

## 2020-03-29 NOTE — Therapy (Signed)
[place], [place], [address] Phone: [phone]   Fax:  [phone] Virtual Visit via Video Note  I connected with Michael [name] on 03/29/20 at  11:00 AM EDT by a video enabled telemedicine application and verified that I am speaking with the correct person using two identifiers.   I discussed the limitations of evaluation and management by telemedicine and the availability of in person appointments. The patient expressed understanding and agreed to proceed.   I discussed the assessment and treatment plan with the patient. The patient was provided an opportunity to ask questions and all were answered. The patient agreed with the plan and demonstrated an understanding of the instructions.   The patient was advised to call back or seek an in-person evaluation if the symptoms worsen or if the condition fails to improve as anticipated.  I provided 60 minutes of non-face-to-face time during this encounter.   Occupational Therapy Treatment  Patient Details  Name: Michael [name] MRN: [id number] Date of Birth: 03/11/1991 Referring Provider (OT): Ricky Ala   Encounter Date: 03/29/2020   OT End of Session - 03/29/20 1308    Visit Number 13    Number of Visits 20    Date for OT Re-Evaluation 04/10/20    Authorization Type [employer]    Authorization - Number of Visits 30    OT Start Time 1110    OT Stop Time 1210    OT Time Calculation (min) 60 min    Activity Tolerance Patient tolerated treatment well    Behavior During Therapy [facility] for tasks assessed/performed           Past Medical History:  Diagnosis Date  . ADHD (attention deficit hyperactivity disorder)   . Autism     History reviewed. No pertinent surgical history.  There were no vitals filed for this visit.   Subjective Assessment - 03/29/20 1308    Currently in Pain? No/denies            OT Education - 03/29/20  1308    Education Details Educated on concept of sensory modulation and self-soothing as coping strategies through use of the five senses    Person(s) Educated Patient    Methods Explanation;Handout    Comprehension Verbalized understanding            OT Short Term Goals - 03/14/20 1426      OT SHORT TERM GOAL #1   Status On-going      OT SHORT TERM GOAL #2   Status On-going      OT SHORT TERM GOAL #3   Status On-going      OT SHORT TERM GOAL #4   Status On-going         Group Session:  S: "I'm autistic, so I know all about this stuff. I could sit here for hours telling you all about everything sensory related that I absolutely hate."  O: Today's group session focused on topic of sensory modulation and self-soothing through use of the five senses. Discussion introduced the concept of sensory modulation and integration, focusing on how we can utilize our body and it's senses to self-soothe or cope, when we are experiencing an over or under-whelming sensation or feeling. Group members were introduced to a sensory diet checklist as a helpful tool/resource that can be utilized to identify what activities and strategies we prefer and do not prefer based upon our response to different stimulus. The  concept of alerting vs calming activities was also introduced to understand how to counteract how we are feeling (Example: when we are feeling overwhelmed/stressed, engage in something calming. When we are feeling depressed/low energy, engage in something alerting). Group members engaged actively in discussion sharing their own personal sensory likes/dislikes.    A: Michael [name] was more active and engaged than noted in previous OT session, with independent contribution to discussion with both peers and OT. Pt shared that she has experience with sensory modulation and integration, as it was something she dealt with and addressed as it relates to her autism diagnosis. Pt shared that she could list off a  hundred different sensory experiences she absolutely hates including crunchy foods and the taste of caramel. Michael [name] identified sensory coping strategies that she does enjoy and help calm her including knitting, cuddling and smelling her dog. Pt appeared both receptive and engaged in further exploring sensory preferences to identify additional coping strategies to add to her toolbox.    P: Continue to attend PHP OT group sessions 5x week for 1 week to promote daily structure, social engagement, and opportunities to develop and utilize adaptive strategies to maximize functional performance in preparation for safe transition and integration back into school, work, and the community. Plan to address topic of stress management in next OT group session.    Plan - 03/29/20 1308    Occupational performance deficits (Please refer to evaluation for details): ADL's;IADL's;Rest and Sleep;Education;Leisure;Social Participation    Body Structure / Function / Physical Skills ADL    Cognitive Skills Attention;Emotional;Energy/Drive;Problem Solve;Safety Awareness;Thought    Psychosocial Skills Coping Strategies;Habits;Routines and Behaviors;Interpersonal Interaction           Patient will benefit from skilled therapeutic intervention in order to improve the following deficits and impairments:   Body Structure / Function / Physical Skills: ADL Cognitive Skills: Attention, Emotional, Energy/Drive, Problem Solve, Safety Awareness, Thought Psychosocial Skills: Coping Strategies, Habits, Routines and Behaviors, Interpersonal Interaction   Visit Diagnosis: Difficulty coping  Frontal lobe and executive function deficit  Severe episode of recurrent major depressive disorder, without psychotic features [facility])    Problem List Patient Active Problem List   Diagnosis Date Noted  . Autistic spectrum disorder 02/11/2020  . MDD (major depressive disorder), recurrent episode, severe (HCC) 02/08/2020  . MDD (major  depressive disorder), recurrent, severe, with psychosis (HCC) 02/07/2020  . 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 ADHD (attention deficit hyperactivity disorder), combined type 01/25/2016  . Generalized anxiety disorder 01/25/2016  . Dysgraphia 01/25/2016    [name], MOT, OTR/L  03/29/2020, 1:09 PM  [facility] HOSPITALIZATION PROGRAM [address] [place], [place], [address] Phone: [phone]   Fax:  [phone]  Name: Michael [name] MRN: [id number] Date of Birth: 05-Sep-1991

## 2020-03-30 ENCOUNTER — Encounter (HOSPITAL_COMMUNITY): Payer: Self-pay

## 2020-03-30 ENCOUNTER — Other Ambulatory Visit (HOSPITAL_COMMUNITY): Payer: BC Managed Care – PPO | Admitting: Occupational Therapy

## 2020-03-30 ENCOUNTER — Other Ambulatory Visit: Payer: Self-pay

## 2020-03-30 DIAGNOSIS — F332 Major depressive disorder, recurrent severe without psychotic features: Secondary | ICD-10-CM

## 2020-03-30 DIAGNOSIS — R4589 Other symptoms and signs involving emotional state: Secondary | ICD-10-CM

## 2020-03-30 DIAGNOSIS — R41844 Frontal lobe and executive function deficit: Secondary | ICD-10-CM

## 2020-03-30 NOTE — BH Assessment (Signed)
Q-actual 2-wk PHQ 2-9   PHQ 2 is 4 PHQ 9 is 5

## 2020-03-30 NOTE — Therapy (Signed)
[facility] PARTIAL HOSPITALIZATION PROGRAM [address] [place], [place], [address] Phone: [phone]   Fax:  [phone] Virtual Visit via Video Note  I connected with [name] on 03/30/20 at  10:00 AM EDT by a video enabled telemedicine application and verified that I am speaking with the correct person using two identifiers.   I discussed the limitations of evaluation and management by telemedicine and the availability of in person appointments. The patient expressed understanding and agreed to proceed.   I discussed the assessment and treatment plan with the patient. The patient was provided an opportunity to ask questions and all were answered. The patient agreed with the plan and demonstrated an understanding of the instructions.   The patient was advised to call back or seek an in-person evaluation if the symptoms worsen or if the condition fails to improve as anticipated.  I provided 65 minutes of non-face-to-face time during this encounter.   Occupational Therapy Treatment  Patient Details  Name: Michael [name] MRN: [id number] Date of Birth: September 30, 1991 Referring Provider (OT): Hillery Jacks   Encounter Date: 03/30/2020   OT End of Session - 03/30/20 1215    Visit Number 14    Number of Visits 20    Date for OT Re-Evaluation 04/10/20    Authorization Type BCBS    Authorization - Number of Visits 30    OT Start Time 1000    OT Stop Time 1105    OT Time Calculation (min) 65 min    Activity Tolerance Patient tolerated treatment well    Behavior During Therapy WFL for tasks assessed/performed           Past Medical History:  Diagnosis Date  . ADHD (attention deficit hyperactivity disorder)   . Autism     History reviewed. No pertinent surgical history.  There were no vitals filed for this visit.   Subjective Assessment - 03/30/20 1214    Currently in Pain? No/denies             OT Education - 03/30/20  1214    Education Details Educated on physical symptomology of stress and its effects on the body, along with positive stress management tips/strategies    Person(s) Educated Patient    Methods Explanation;Handout    Comprehension Verbalized understanding            OT Short Term Goals - 03/14/20 1426      OT SHORT TERM GOAL #1   Status On-going      OT SHORT TERM GOAL #2   Status On-going      OT SHORT TERM GOAL #3   Status On-going      OT SHORT TERM GOAL #4   Status On-going         Group Session:  S: "I just use an if, then statement to keep myself accountable. Rarely do I ever not follow through with it."  O: Group began with a check-in and review of previous OT session focused on sensory modulation and self-soothing with members sharing ways in which they coped over the previous afternoon. Today's discussion focused on the topic of stress management. Group members worked collaboratively to create a Microbiologist identifying physical signs, behavioral signs, emotional/psychological, and cognitive signs of stress. Discussion then focused and encouraged group members to identify positive stress management strategies they could utilize in those moments to manage identified signs.   A: [name] was active in her participation of discussion  and activity. She shared that using an if, then statement works best for her to manage her stress in the moment, noting she rarely does not follow through. She noted that an if, then statement holds herself accountable to complete a task with either another goal in place or a reward for task completion. She identified additional stress management strategies including knitting, sanskrit, and learning a new language. Pt appeared receptive and attentive to additional strategies and suggestions provided.     P: Continue to attend PHP OT group sessions 5x week for 1 week to promote daily structure, social engagement, and opportunities to  develop and utilize adaptive strategies to maximize functional performance in preparation for safe transition and integration back into school, work, and the community. Plan to address topic of safety planning in next OT group session.    Plan - 03/30/20 1215    Occupational performance deficits (Please refer to evaluation for details): ADL's;IADL's;Rest and Sleep;Education;Leisure;Social Participation    Body Structure / Function / Physical Skills ADL    Cognitive Skills Attention;Emotional;Energy/Drive;Problem Solve;Safety Awareness;Thought    Psychosocial Skills Coping Strategies;Habits;Routines and Behaviors;Interpersonal Interaction           Patient will benefit from skilled therapeutic intervention in order to improve the following deficits and impairments:   Body Structure / Function / Physical Skills: ADL Cognitive Skills: Attention, Emotional, Energy/Drive, Problem Solve, Safety Awareness, Thought Psychosocial Skills: Coping Strategies, Habits, Routines and Behaviors, Interpersonal Interaction   Visit Diagnosis: Difficulty coping  Frontal lobe and executive function deficit  Severe episode of recurrent major depressive disorder, without psychotic features [facility])    Problem List Patient Active Problem List   Diagnosis Date Noted  . Autistic spectrum disorder 02/11/2020  . MDD (major depressive disorder), recurrent episode, severe ([place]) 02/08/2020  . MDD (major depressive disorder), recurrent, severe, with psychosis ([place]) 02/07/2020  . 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 ADHD (attention deficit hyperactivity disorder), combined type 01/25/2016  . Generalized anxiety disorder 01/25/2016  . Dysgraphia 01/25/2016    Ponciano Ort, MOT, OTR/L  03/30/2020, 12:16 PM  [place], [place], [address] Phone: [phone]   Fax:  [phone]  Name: Michael [name] MRN:  [id number] Date of Birth: 02-Oct-1991

## 2020-04-01 NOTE — Psych (Signed)
Virtual Visit via Video Note  I connected with Michael [name] on 03/20/20 at  9:00 AM EDT by a video enabled telemedicine application and verified that I am speaking with the correct person using two identifiers.   I discussed the limitations of evaluation and management by telemedicine and the availability of in person appointments. The patient expressed understanding and agreed to proceed.  I discussed the assessment and treatment plan with the patient. The patient was provided an opportunity to ask questions and all were answered. The patient agreed with the plan and demonstrated an understanding of the instructions.   The patient was advised to call back or seek an in-person evaluation if the symptoms worsen or if the condition fails to improve as anticipated.  Pt was provided 240 minutes of non-face-to-face time during this encounter.   [name], LCSW    [facility] PHP THERAPIST PROGRESS NOTE  Michael [name] [id number]  Session Time: 9:00 - 10:00  Participation Level: Active  Behavioral Response: CasualAlertDepressed  Type of Therapy: Group Therapy  Treatment Goals addressed: Coping  Interventions: CBT, DBT, Supportive and Reframing  Summary: Clinician led check-in regarding current stressors and situation, and review of patient completed daily inventory. Clinician utilized active listening and empathetic response and validated patient emotions. Clinician facilitated processing group on pertinent issues.   Therapist Response:Michael M Avakian "[name]" is a 29 y.o. adult who presents with depression and anxiety symptoms. Patient arrived within time allowed and reports that she is feeling "a bit dysphoric. Patient rates her mood at a 6 on a scale of 1-10 with 10 being great. Pt states yesterday she studied Psychologist, clinical and did guided meditation with [employer]. Pt reports her facial hair is a trigger for her and she is struggling with some rumination.  Pt  engaged in discussion.        Session Time: 10:00-11:00  Participation Level:Active  Behavioral Response:CasualAlertDepressed  Type of Therapy:Group Therapy  Treatment Goals addressed: Coping  Interventions:CBT, DBT, Solution Focused, Supportive and Reframing  Summary: Cln led discussion on struggles within romantic relationships. Group members shared issues they are having in romantic relationships and the way it is affecting them. Cln synthesized tenets of boundaries, self-esteem, and communication within discussion.   Therapist Response: Pt engaged in discussion and reports struggle with knowing how people are taking her behaviors. Pt able to process and provide and receive support.        Session Time: 11:00- 12:00  Participation Level:Active  Behavioral Response:CasualAlertDepressed  Type of Therapy: Group Therapy, OT  Treatment Goals addressed: Coping  Interventions:Psychosocial skills training, Supportive  Summary:Occupational Therapy group  Therapist Response:Patient engaged in group. See OT note.         Session Time: 12:00 -1:00  Participation Level:Active  Behavioral Response:CasualAlertDepressed  Type of Therapy:Group therapy  Treatment Goals addressed: Coping  Interventions:CBT; Solution focused; Supportive; Reframing  Summary:12:00 - 12:50: Cln introduced topic of cognitive distortions. Cln brought in CBT and the cognitive triangle to inform discussion.  Cln utilized handout "Cognitive Distortions" to discuss common distorted thought patterns. Group members discussed examples of distortions from their own life and the way each distortion affects them.  12:50 -1:00 Clinician led check-out. Clinician assessed for immediate needs, medication compliance and efficacy, and safety concerns  Therapist Response:12:00 - 12:50: Pt engaged in discussion and identifies personalization and  magnification/minimization as problematic for her. 12:50 - 1:00: At check-out, patientrates hermood at a6 on a scale of 1-10 with 10 being great.Pt states afternoon plans  of working on the Science writer.Patient demonstratessomeprogress as evidenced by intentional practice of relaxation techniques.Patient denies SI/HI/self-harm at the end of group.    Suicidal/Homicidal: Nowithout intent/plan  Plan: Pt will continue in PHP while working to decrease depression and anxiety symptoms, increase ability to manage symptoms in a healthy manner, and increase prosocial activities.    Diagnosis: Severe episode of recurrent major depressive disorder, without psychotic features (HCC) [F33.2]    1. Severe episode of recurrent major depressive disorder, without psychotic features (HCC)   2. Generalized anxiety disorder       [name], [place] 04/01/2020

## 2020-04-01 NOTE — Psych (Signed)
Virtual Visit via Video Note  I connected with Michael [name] on 03/21/20 at  9:00 AM EDT by a video enabled telemedicine application and verified that I am speaking with the correct person using two identifiers.   I discussed the limitations of evaluation and management by telemedicine and the availability of in person appointments. The patient expressed understanding and agreed to proceed.  I discussed the assessment and treatment plan with the patient. The patient was provided an opportunity to ask questions and all were answered. The patient agreed with the plan and demonstrated an understanding of the instructions.   The patient was advised to call back or seek an in-person evaluation if the symptoms worsen or if the condition fails to improve as anticipated.  Pt was provided 240 minutes of non-face-to-face time during this encounter.   [name], LCSW    [facility] PHP THERAPIST PROGRESS NOTE  TOSH GLAZE [id number]  Session Time: 9:00 - 10:00  Participation Level: Active  Behavioral Response: CasualAlertDepressed  Type of Therapy: Group Therapy  Treatment Goals addressed: Coping  Interventions: CBT, DBT, Supportive and Reframing  Summary: Clinician led check-in regarding current stressors and situation, and review of patient completed daily inventory. Clinician utilized active listening and empathetic response and validated patient emotions. Clinician facilitated processing group on pertinent issues.   Therapist Response:Michael M Egle "[name]" is a 29 y.o. adult who presents with depression and anxiety symptoms. Patient arrived within time allowed and reports that she is feeling "dysphoric." Patient rates her mood at a 6 on a scale of 1-10 with 10 being great. Pt reports yesterday was "fine" and she studied and talked with a friend. Pt struggles with consistency. Pt engaged in discussion.        Session Time: 10:00-11:00  Participation  Level:Active  Behavioral Response:CasualAlertDepressed  Type of Therapy:Group Therapy  Treatment Goals addressed: Coping  Interventions:CBT, DBT, Solution Focused, Supportive and Reframing  Summary: Cln led discussion on manipulative behaviors and how to identify them. Group members shared situations in which they have felt manipulated and taken advantage of. Cln encouraged pt's to pay attention to people's actions, not only their words, to increase likeliness that feelings are not misleading [place].   Therapist Response: Pt engaged in discussion and shares ways manipulation has hurt them.      Session Time: 11:00 -12:00   Participation Level: Active   Behavioral Response: CasualAlertDepressed   Type of Therapy: Group Therapy, psychotherapy   Treatment Goals addressed: Coping   Interventions: Strengths based, reframing, Supportive,    Summary:  Spiritual Care group   Therapist Response: Patient engaged in group. See chaplain note.      Session Time: 12:00 -1:00  Participation Level:Active  Behavioral Response:CasualAlertDepressed  Type of Therapy:Group therapy  Treatment Goals addressed: Coping  Interventions:CBT; Solution focused; Supportive; Reframing  Type of Therapy: Group Therapy, OT  Treatment Goals addressed: Coping  Interventions:Psychosocial skills training, Supportive  Summary:12:00 - 12:50: Occupational Therapy group 12:50 -1:00 Clinician led check-out. Clinician assessed for immediate needs, medication compliance and efficacy, and safety concerns  Therapist Response:Patient engaged in group. See OT note.  12:50 - 1:00: At check-out, patientrates hermood at a6.5 on a scale of 1-10 with 10 being great.Pt states afternoon plans of studying Sanskrit.Patient demonstratessomeprogress as evidenced by increased depth of sharing.Patient denies SI/HI/self-harm at the end of group.    Suicidal/Homicidal: Nowithout  intent/plan  Plan: Pt will continue in PHP while working to decrease depression and anxiety symptoms, increase ability to manage  symptoms in a healthy manner, and increase prosocial activities.    Diagnosis: Severe episode of recurrent major depressive disorder, without psychotic features ([place]) [F33.2]    1. Severe episode of recurrent major depressive disorder, without psychotic features ([place])   2. Generalized anxiety disorder   3. Autistic spectrum disorder       Michael Glass, LCSW 04/01/2020

## 2020-04-02 ENCOUNTER — Other Ambulatory Visit (HOSPITAL_COMMUNITY): Payer: BC Managed Care – PPO | Admitting: Licensed Clinical Social Worker

## 2020-04-02 ENCOUNTER — Other Ambulatory Visit (HOSPITAL_COMMUNITY): Payer: BC Managed Care – PPO | Admitting: Occupational Therapy

## 2020-04-02 ENCOUNTER — Other Ambulatory Visit: Payer: Self-pay

## 2020-04-02 ENCOUNTER — Encounter (HOSPITAL_COMMUNITY): Payer: Self-pay

## 2020-04-02 DIAGNOSIS — F840 Autistic disorder: Secondary | ICD-10-CM

## 2020-04-02 DIAGNOSIS — F332 Major depressive disorder, recurrent severe without psychotic features: Secondary | ICD-10-CM | POA: Diagnosis not present

## 2020-04-02 DIAGNOSIS — R4589 Other symptoms and signs involving emotional state: Secondary | ICD-10-CM

## 2020-04-02 DIAGNOSIS — F411 Generalized anxiety disorder: Secondary | ICD-10-CM

## 2020-04-02 DIAGNOSIS — R41844 Frontal lobe and executive function deficit: Secondary | ICD-10-CM

## 2020-04-02 NOTE — Progress Notes (Signed)
Spoke with patient via Webex video call, used 2 identifiers to correctly identify patient. States that he is going to be discharged today and going to IOP tomorrow. He has enjoyed group and felt that he got a lot more from listening than he thought he would. Has been sleeping well. Denies SI/HI or AV hallucinations.

## 2020-04-02 NOTE — Therapy (Signed)
[place], [place], [address] Phone: [phone]   Fax:  [phone] Virtual Visit via Video Note  I connected with Michael [name] on 04/02/20 at  11:00 AM EDT by a video enabled telemedicine application and verified that I am speaking with the correct person using two identifiers.   I discussed the limitations of evaluation and management by telemedicine and the availability of in person appointments. The patient expressed understanding and agreed to proceed.   I discussed the assessment and treatment plan with the patient. The patient was provided an opportunity to ask questions and all were answered. The patient agreed with the plan and demonstrated an understanding of the instructions.   The patient was advised to call back or seek an in-person evaluation if the symptoms worsen or if the condition fails to improve as anticipated.  I provided 50 minutes of non-face-to-face time during this encounter.   Occupational Therapy Treatment  Patient Details  Name: Michael [name] MRN: [id number] Date of Birth: 1991-04-14 Referring Provider (OT): Ricky Ala   Encounter Date: 04/02/2020   OT End of Session - 04/02/20 1232    Visit Number 15    Number of Visits 20    Date for OT Re-Evaluation 04/10/20    Authorization Type BCBS    Authorization - Number of Visits 30    OT Start Time 1110    OT Stop Time 1200    OT Time Calculation (min) 50 min    Activity Tolerance Patient tolerated treatment well    Behavior During Therapy Michael [name] for tasks assessed/performed;Flat affect           Past Medical History:  Diagnosis Date  . ADHD (attention deficit hyperactivity disorder)   . Autism     History reviewed. No pertinent surgical history.  There were no vitals filed for this visit.   Subjective Assessment - 04/02/20 1231    Currently in Pain? No/denies            OT Education  - 04/02/20 1231    Education Details Educated on identifying coping strategies, social supports, and community mental health resources available through use of safety planning tool    Person(s) Educated Patient    Methods Explanation;Handout    Comprehension Verbalized understanding            OT Short Term Goals - 04/02/20 1232      OT SHORT TERM GOAL #1   Title Pt will actively engage in OT group sessions throughout duration of PHP programming, in order to promote daily structure, social engagement, and opportunities to develop and utilize adaptive strategies to maximize functional performance in preparation for safe transition and integration back into school, work, and the community.    Status Achieved      OT SHORT TERM GOAL #2   Title Pt will identify 1-3 strategies to increase social participation, in order to promote healthy socialization and community reintegration, in preparation for discharge.    Status Partially Met      OT SHORT TERM GOAL #3   Title Pt will practice and identify 1-3 adaptive coping strategies she can utilize, in order to safely manage increased depression/anxiety, with min cues, in preparation for safe and healthy reintegration back into the community at discharge.    Status Achieved      OT SHORT TERM GOAL #4   Title Pt will identify 1-3 sleep hygiene strategies he can utilize,  in order to improve sleep quality/ADL performance, in preparation for safe and healthy reintegration back into the community at discharge.    Status Achieved         Group Session:  S: "I like to read the dictionary. I have them in different languages too so sometimes it depends on what I want to learn."  O: Today's group discussion focused on the topic of Safety Planning. Patients were educated on what a safety plan is, what it can be used for, and why we should create one. Group then worked collaboratively and independently to create an individualized safety plan including  identifying warning signs, coping strategies, places to go for distraction, social supports, professional supports, and how to make the environment safe. Group members were also encouraged to reflect on the question "What is life worth living for?" Group session ended with patients encouraged to utilize their safety plan as an all-inclusive resource when experiencing a mental health crisis.    A: Michael [name] was active and independent in her participation of discussion and activity. Michael [name] shared that she likes to read the dictionary as a distractive, coping strategy when she wants to get out of her own thoughts. She also identified learning a new language and knitting as additional strategies. Pt shared that she was building up her online community, though recognized need to have additional supports within her social circle that she could identify as supports. Pt appeared attentive and receptive for further information and education provided on safety planning tool.  P: Pt has met her goals for the [facility] program and will discharge to IOP.  OCCUPATIONAL THERAPY DISCHARGE SUMMARY  Visits from Start of Care: 15  Current functional level related to goals / functional outcomes: Patient has met 3/4 of her identified OT goals, however would benefit from continued support to identify and grow her social supports/connections outside of her online community in order to further support goal of increasing socialization skills.   Remaining deficits: See above   Education / Equipment: See above  Plan: Patient agrees to discharge.  Patient goals were partially met. Patient is being discharged due to meeting the stated rehab goals.  ?????          Plan - 04/02/20 1232    Occupational performance deficits (Please refer to evaluation for details): ADL's;IADL's;Rest and Sleep;Education;Leisure;Social Participation    Body Structure / Function / Physical Skills ADL    Cognitive Skills  Attention;Emotional;Energy/Drive;Problem Solve;Safety Awareness;Thought    Psychosocial Skills Coping Strategies;Habits;Routines and Behaviors;Interpersonal Interaction           Patient will benefit from skilled therapeutic intervention in order to improve the following deficits and impairments:   Body Structure / Function / Physical Skills: ADL Cognitive Skills: Attention, Emotional, Energy/Drive, Problem Solve, Safety Awareness, Thought Psychosocial Skills: Coping Strategies, Habits, Routines and Behaviors, Interpersonal Interaction   Visit Diagnosis: Difficulty coping  Frontal lobe and executive function deficit  Severe episode of recurrent major depressive disorder, without psychotic features [facility])    Problem List Patient Active Problem List   Diagnosis Date Noted  . Autistic spectrum disorder 02/11/2020  . MDD (major depressive disorder), recurrent episode, severe ([place]) 02/08/2020  . MDD (major depressive disorder), recurrent, severe, with psychosis ([place]) 02/07/2020  . 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 ADHD (attention deficit hyperactivity disorder), combined type 01/25/2016  . Generalized anxiety disorder 01/25/2016  . Dysgraphia 01/25/2016    Ponciano Ort, MOT, OTR/L  04/02/2020, 12:33 PM  [place]  [place], [place], [address] Phone: [phone]   Fax:  [phone]  Name: Michael [name] MRN: [id number] Date of Birth: August 09, 1991

## 2020-04-03 ENCOUNTER — Encounter (HOSPITAL_COMMUNITY): Payer: Self-pay

## 2020-04-03 ENCOUNTER — Other Ambulatory Visit (HOSPITAL_COMMUNITY): Payer: BC Managed Care – PPO | Admitting: Psychiatry

## 2020-04-03 ENCOUNTER — Other Ambulatory Visit: Payer: Self-pay

## 2020-04-03 DIAGNOSIS — F332 Major depressive disorder, recurrent severe without psychotic features: Secondary | ICD-10-CM

## 2020-04-03 DIAGNOSIS — F411 Generalized anxiety disorder: Secondary | ICD-10-CM

## 2020-04-03 NOTE — Progress Notes (Signed)
Virtual Visit via Video Note  I connected with Michael [name] on @TODAY @ at  9:00 AM EDT by a video enabled telemedicine application and verified that I am speaking with the correct person using two identifiers.   I discussed the limitations of evaluation and management by telemedicine and the availability of in person appointments. The patient expressed understanding and agreed to proceed.  I discussed the assessment and treatment plan with the patient. The patient was provided an opportunity to ask questions and all were answered. The patient agreed with the plan and demonstrated an understanding of the instructions.   The patient was advised to call back or seek an in-person evaluation if the symptoms worsen or if the condition fails to improve as anticipated.  I provided 20 minutes of non-face-to-face time during this encounter.  Patient ID: Michael [name], adult   DOB: 12/01/1990, 29 y.o.   MRN: [id number] As per previous [facility] note states: Pt reports to PHP per inpt. Pt was inpt due to SI and increased depression. Pt denies current SI/HI- pt reports decreased AVH since started [employer]il[employer]: pt reports she was hearing voices tell her she is a stalker, a piece of garbage, and would better off not alive (This cln believes it is most likely internal, negative voice, not psychosis). Pt reports seeing "[name]" for psychiatry. Pt reports she was seeing a counselor but insurance wouldn't continue to pay for it so does not have current counselor. Pt denies previous attempts and hospitalizations; pt reports recent self-harm act of punching self "a few weeks ago." No current thoughts of SH. Pt reports following stressors: 1) Decreased support: Pt reports she lost her "community" recently when they called her a stalker and then blocked her on all forms of communication. Pt reports she is unsure why this happened. 2) Diagnoses: Pt is diagnosed with dyslexia and dysgraphia, Autism, ADHD, and  depression. 3) School: Pt is scheduled to graduate with bachelors degree in Criminology soon. Pt is trying to get into grad school. 4) Gender Identity: Pt is transgender MTF. Pt reports insurance will not cover all medications needed for treatment. Patient's Currently Reported Symptoms/Problems: Increased depression; feelings of hopelessness/worthlessness; social anxiety; decreased ADLs- hygiene (increased but not baseline since d/c from inpt) and cleaning; racing thoughts; irritability; low energy; anhedonia; pt reports hallucinations of voices tell her she is worthless, a stalker. cln believes this is internal dialogue with negative self talk   Pt transitioned from PHP to MH-IOP today.  States she has enjoyed attending PHP.  "I got a lot more from listening than I thought I would."  Reports improved sleep.  Denies SI/HI or A/V hallucinations. A:  Oriented pt.  Pt gave verbal consent for treatment, to release chart information to referred providers and to complete any forms if needed.  Pt also gave consent for attending group virtually d/t COVID-19 social distancing restrictions.  Encouraged support groups.  Refer pt to psychiatrist and therapist if she doesn't have one.  R:  Pt receptive.  [facility], M.Ed, CNA

## 2020-04-03 NOTE — Psych (Signed)
Virtual Visit via Video Note  I connected with Michael [name] on 03/23/20 at  9:00 AM EDT by a video enabled telemedicine application and verified that I am speaking with the correct person using two identifiers.  Location: Patient: Patient Home Provider: Clinical Home Office   I discussed the limitations of evaluation and management by telemedicine and the availability of in person appointments. The patient expressed understanding and agreed to proceed.  I discussed the assessment and treatment plan with the patient. The patient was provided an opportunity to ask questions and all were answered. The patient agreed with the plan and demonstrated an understanding of the instructions.   The patient was advised to call back or seek an in-person evaluation if the symptoms worsen or if the condition fails to improve as anticipated.  Pt was provided 240 minutes of non-face-to-face time during this encounter.   Michael Guiles, LCSW    [facility] PHP THERAPIST PROGRESS NOTE  Michael [name]  Session Time: 9:00 - 10:00  Participation Level: Active  Behavioral Response: CasualAlertDepressed  Type of Therapy: Group Therapy  Treatment Goals addressed: Coping  Interventions: CBT, DBT, Supportive and Reframing  Summary: Clinician led check-in regarding current stressors and situation, and review of patient completed daily inventory. Clinician utilized active listening and empathetic response and validated patient emotions. Clinician facilitated processing group on pertinent issues.   Therapist Response:Michael [name] "[name]" is a 29 y.o. adult who presents with depression and anxiety symptoms. Patient arrived within time allowed and reports that she is feeling "groggy." Patient rates her mood at a 7 on a scale of 1-10 with 10 being great. Pt states her afternoon went well and a friend came over and they took a walk. Pt shares she is applying for the GRE and is  struggling with her feelings about the need to take it. Pt able to process.  Pt engaged in discussion.        Session Time: 10:00-11:00  Participation Level:Active  Behavioral Response:CasualAlertDepressed  Type of Therapy:Group Therapy  Treatment Goals addressed: Coping  Interventions:CBT, DBT, Solution Focused, Supportive and Reframing  Summary: Cln continued topic of cognitive distortions and introduced ways to "challenge" identified distorted thoughts through use of CBT strategies. Cln discussed "checking the facts" and other was to access logic when in the midst of irrational thoughts. Group members shared ways they have thought challenged in the past and how it can help them moving forward.   Therapist Response:  Pt engaged in discussion and reports understanding of thought challenging.       Session Time: 11:00- 12:00  Participation Level:Active  Behavioral Response:CasualAlertDepressed  Type of Therapy: Group Therapy, OT  Treatment Goals addressed: Coping  Interventions:Psychosocial skills training, Supportive  Summary:Occupational Therapy group  Therapist Response:Patient engaged in group. See OT note.         Session Time: 12:00 -1:00  Participation Level:Active  Behavioral Response:CasualAlertDepressed  Type of Therapy:Group therapy  Treatment Goals addressed: Coping  Interventions:CBT; Solution focused; Supportive; Reframing  Summary:12:00 - 12:50: Cln continued topic of thought challenging and group practiced utilizing this skill. Cln showed various clips from tv/movies and group worked to identify the distorted thoughts and challenge them.  12:50 -1:00 Clinician led check-out. Clinician assessed for immediate needs, medication compliance and efficacy, and safety concerns  Therapist Response:12:00 - 12:50: Pt engaged in discussion and activity. Pt demonstrates ability to utilize thought  challenging skill through the activity and reports increased confidence in practicing this in their every  day life.  12:50 - 1:00: At check-out, patientrates hermood at [facility] on a scale of 1-10 with 10 being great.Pt states afternoon plans of prepping a D and D game.Patient demonstratessomeprogress as evidenced by increased stability in mood.Patient denies SI/HI/self-harm at the end of group.    Suicidal/Homicidal: Nowithout intent/plan  Plan: Pt will continue in PHP while working to decrease depression and anxiety symptoms, increase ability to manage symptoms in a healthy manner, and increase prosocial activities.    Diagnosis: Severe episode of recurrent major depressive disorder, without psychotic features ([place]) [F33.2]    1. Severe episode of recurrent major depressive disorder, without psychotic features ([place])   2. Generalized anxiety disorder   3. Autistic spectrum disorder       Michael [name], [place] 04/03/2020

## 2020-04-03 NOTE — Progress Notes (Signed)
Virtual Visit via Video Note  I connected with Michael [name] on 04/03/20 at  9:00 AM EDT by a video enabled telemedicine application and verified that I am speaking with the correct person using two identifiers.  At orientation to the IOP program Case Manager discussed the limitations of evaluation and management by telemedicine and the availability of in person appointments. The patient expressed understanding and agreed to proceed with virtual visits.   Location:  Patient: Patient Home Provider: Home Office   History of Present Illness: MDD, severe, recurrent, with psychosis   Observations/Objective: Check In: Case Manager checked in with all participants to review discharge dates, insurance authorizations, work-related documents and needs for the treatment team, including medication review and assessment. Case Manager introduced, [name] as a new Client to the group, all welcomed and started the joining process.   Initial Therapeutic Activity: Counselor facilitated therapeutic processing with group members to assess mood and current functioning, prompting group members to share about application of skills, progress and challenges in treatment/personal lives. Client joined group today, with Counselor outlining culture and agenda of treatment. Client shared what brought her to treatment, goals for treatment, about support system and historical context of mental health symptoms. Client presents with moderate depression and moderate anxiety. Client denied any current SI/HI/psychosis.   Second Therapeutic Activity: Counselor prompted group members to journal for 5-10 minutes on the concept of acceptance, with the question of "What is hard for you to accept right now?" Counselor allowed each to share their reflections to bring contest to next activity. Counselor provided psychoeducation on the DBT skill of Radical Acceptance, sharing the steps and a practical application video on Willingness by  Rob Hickman, LMFT of Therapy in a Nutshell. Client engaged in discussion and walked through application of skills in her particular experiences. Client understood concepts.   Check Out: Counselor closed program prompting group members to share one self-care practice or productivity activity they plan to engage in today. Client plans to do some recording and spend time learning [employer].  Assessment and Plan: Clinician recommends that Client remain in IOP treatment to better manage mental health symptoms, stabilization and to address treatment plan goals. Clinician recommends adherence to crisis/safety plan, taking medications as prescribed, and following up with medical professionals if any issues arise.   Follow Up Instructions: Clinician will send Webex link for next session. The Client was advised to call back or seek an in-person evaluation if the symptoms worsen or if the condition fails to improve as anticipated.     I provided 180 minutes of non-face-to-face time during this encounter.     Hilbert Odor, LCSW

## 2020-04-03 NOTE — Psych (Signed)
Virtual Visit via Video Note  I connected with Michael [name] on 03/22/20 at  9:00 AM EDT by a video enabled telemedicine application and verified that I am speaking with the correct person using two identifiers.  Location: Patient: Patient Home Provider: Clinical Home Office   I discussed the limitations of evaluation and management by telemedicine and the availability of in person appointments. The patient expressed understanding and agreed to proceed.  I discussed the assessment and treatment plan with the patient. The patient was provided an opportunity to ask questions and all were answered. The patient agreed with the plan and demonstrated an understanding of the instructions.   The patient was advised to call back or seek an in-person evaluation if the symptoms worsen or if the condition fails to improve as anticipated.  Pt was provided 240 minutes of non-face-to-face time during this encounter.   [name], LCSW    [facility] PHP THERAPIST PROGRESS NOTE  ANTIONE OBAR [id number]  Session Time: 9:00 - 10:00  Participation Level: Active  Behavioral Response: CasualAlertDepressed  Type of Therapy: Group Therapy  Treatment Goals addressed: Coping  Interventions: CBT, DBT, Supportive and Reframing  Summary: Clinician led check-in regarding current stressors and situation, and review of patient completed daily inventory. Clinician utilized active listening and empathetic response and validated patient emotions. Clinician facilitated processing group on pertinent issues.   Therapist Response:Michael [name] "Michael [name]" is a 29 y.o. adult who presents with depression and anxiety symptoms. Patient arrived within time allowed and reports that she is feeling "okay." Patient rates her mood at a 6 on a scale of 1-10 with 10 being great. Pt states she had some "upsetting" dreams last night that have affected her mood as she is waking up. Pt states she studied  Sanskrit and walked with a friend yesterday and it went well. Pt continues to struggle with talking about deeper issues. Pt engaged in discussion.        Session Time: 10:00-11:00  Participation Level:Active  Behavioral Response:CasualAlertDepressed  Type of Therapy:Group Therapy  Treatment Goals addressed: Coping  Interventions:CBT, DBT, Solution Focused, Supportive and Reframing  Summary: Cln led discussion on distractions. Cln utilized DBT distress tolerance skills to inform discussion and discussed how distractions aim to pull our thoughts away from a situation when we are emotionally escalated. Group brainstormed ways they could distract themselves when experiencing strong emotion.   Therapist Response:  Pt engaged in discussion and shares ways they struggle to manage their feelings "sometimes." Pt identifies research as distractions she can use.        Session Time: 11:00- 12:00  Participation Level:Active  Behavioral Response:CasualAlertDepressed  Type of Therapy: Group Therapy, OT  Treatment Goals addressed: Coping  Interventions:Psychosocial skills training, Supportive  Summary:Occupational Therapy group  Therapist Response:Patient engaged in group. See OT note.         Session Time: 12:00 -1:00  Participation Level:Active  Behavioral Response:CasualAlertDepressed  Type of Therapy:Group therapy  Treatment Goals addressed: Coping  Interventions:CBT; Solution focused; Supportive; Reframing  Summary:12:00 - 12:50: Cln continued topic of cognitive distortions. Cln utilized handout "Unhealthy Thought Patterns" to discuss further examples of distorted thought patterns. Group members discussed examples of distortions from their own life and the way it affects them.   12:50 -1:00 Clinician led check-out. Clinician assessed for immediate needs, medication compliance and efficacy, and safety  concerns  Therapist Response:12:00 - 12:50: Pt engaged in discussion and identifies struggling with false permanence.  12:50 - 1:00: At check-out, patientrates  hermood at [facility] on a scale of 1-10 with 10 being great.Pt states afternoon plans of working on graduate school information.Patient demonstratessomeprogress as evidenced by reframing her negative thoughts.Patient denies SI/HI/self-harm at the end of group.    Suicidal/Homicidal: Nowithout intent/plan  Plan: Pt will continue in PHP while working to decrease depression and anxiety symptoms, increase ability to manage symptoms in a healthy manner, and increase prosocial activities.    Diagnosis: Severe episode of recurrent major depressive disorder, without psychotic features ([place]) [F33.2]    1. Severe episode of recurrent major depressive disorder, without psychotic features ([place])   2. Generalized anxiety disorder   3. Autistic spectrum disorder       Michael [name], [place] 04/03/2020

## 2020-04-04 ENCOUNTER — Encounter (HOSPITAL_COMMUNITY): Payer: Self-pay

## 2020-04-04 ENCOUNTER — Other Ambulatory Visit (HOSPITAL_COMMUNITY): Payer: BC Managed Care – PPO | Admitting: Psychiatry

## 2020-04-04 ENCOUNTER — Other Ambulatory Visit: Payer: Self-pay

## 2020-04-04 DIAGNOSIS — F332 Major depressive disorder, recurrent severe without psychotic features: Secondary | ICD-10-CM | POA: Diagnosis not present

## 2020-04-04 NOTE — Telephone Encounter (Signed)
Returned pt call. Reminded that he and I spoke a month ago advising him about the denial and to use the Good Rx coupon to purchase [facility]. In addition, advised that whomever he spoke with at [facility] misinformed him about the denial. Advised that we not only received a letter from [facility] but that we also have the Ref#'s to correlate to the PA performed back in May (refer below). Pt responded by saying, "Ummm, yes I do recall the conversation now." Asked if he had any further questions or concerns but declined at this time. No further action required.

## 2020-04-04 NOTE — Progress Notes (Signed)
Virtual Visit via Telephone Note  I connected with Annette Liotta Saner on 04/04/20 at  9:00 AM EDT by telephone and verified that I am speaking with the correct person using two identifiers.   I discussed the limitations, risks, security and privacy concerns of performing an evaluation and management service by telephone and the availability of in person appointments. I also discussed with the patient that there may be a patient responsible charge related to this service. The patient expressed understanding and agreed to proceed.   I discussed the assessment and treatment plan with the patient. The patient was provided an opportunity to ask questions and all were answered. The patient agreed with the plan and demonstrated an understanding of the instructions.   The patient was advised to call back or seek an in-person evaluation if the symptoms worsen or if the condition fails to improve as anticipated.  I provided 30 minutes of non-face-to-face time during this encounter.   Suella Broad, FNP    Psychiatric Initial Adult Assessment   Patient Identification: DEANTHONY MAULL MRN:  [id number] Date of Evaluation:  04/04/2020 Referral Source: Dr. Benita Stabile Chief Complaint:  Depression  Visit Diagnosis:    ICD-10-CM   1. Severe episode of recurrent major depressive disorder, without psychotic features ([place])  F33.2     History of Present Illness:  Xachary "Calin" Drusdo is a 29 y.o. Caucasian adult presents with worsening depression and anxiety after recent inpatient admission at [facility]. (Transgender- male pronouns).  Reports struggling with multiple stressors in past chronic ideations of thoughts of death.  Denies plan or intent.  Reports intermittent auditory hallucinations.  Currently denying hearing voices or sounds at this time.  Denies suicidal or homicidal ideations.  Denies auditory or visual hallucinations.  Patient reports he is  currently followed by therapist and a psychiatrist Marquis Lunch where he is prescribed Zoloft, BuSpar for his ADHD and depression.  Denies history of substance abuse with alcohol or illicit drug use.  Patient was enrolled in partial psychiatric program on 03/14/20.  Associated Signs/Symptoms: Depression Symptoms:  depressed mood, insomnia, psychomotor retardation, fatigue, feelings of worthlessness/guilt, hopelessness, anxiety, (Hypo) Manic Symptoms:  Labiality of Mood, Anxiety Symptoms:  Excessive Worry, Panic Symptoms, Social Anxiety, Psychotic Symptoms:  Hallucinations: Auditory PTSD Symptoms: Negative  Past Psychiatric History:Patient has no previous hospitalizations.  Other than to near attempts in April no previous suicide attempts.  Very occasional self injury in the past.  Previous diagnosis with autistic spectrum disorder as a child.  Diagnosed with ADHD but only on low-dose medication.  Previous Psychotropic Medications: Yes   Substance Abuse History in the last 12 months:  No.  Consequences of Substance Abuse: Negative  Past Medical History:  Past Medical History:  Diagnosis Date  . ADHD (attention deficit hyperactivity disorder)   . Anxiety   . Autism    No past surgical history on file.  Family Psychiatric History: Reports several family members with depression but no family history of suicide  Family History:  Family History  Problem Relation Age of Onset  . Asthma Mother   . Depression Mother   . Learning disabilities Mother   . ADD / ADHD Father   . Other Father        low testosterone  . Speech disorder Brother     Social History:   Social History   Socioeconomic History  . Marital status: Single    Spouse name: Not on file  . Number of children:  Not on file  . Years of education: Not on file  . Highest education level: Not on file  Occupational History  . Not on file  Tobacco Use  . Smoking status: Never Smoker  . Smokeless tobacco:  Never Used  Substance and Sexual Activity  . Alcohol use: Not on file  . Drug use: Not on file  . Sexual activity: Not on file  Other Topics Concern  . Not on file  Social History Narrative  . Not on file   Social Determinants of Health   Financial Resource Strain:   . Difficulty of Paying Living Expenses:   Food Insecurity:   . Worried About Programme researcher, broadcasting/film/video in the Last Year:   . Barista in the Last Year:   Transportation Needs:   . Freight forwarder (Medical):   Marland Kitchen Lack of Transportation (Non-Medical):   Physical Activity:   . Days of Exercise per Week:   . Minutes of Exercise per Session:   Stress:   . Feeling of Stress :   Social Connections:   . Frequency of Communication with Friends and Family:   . Frequency of Social Gatherings with Friends and Family:   . Attends Religious Services:   . Active Member of Clubs or Organizations:   . Attends Banker Meetings:   Marland Kitchen Marital Status:     Additional Social History:   Allergies:   Allergies  Allergen Reactions  . Shellfish Allergy     Metabolic Disorder Labs: Lab Results  Component Value Date   HGBA1C 5.4 02/09/2020   MPG 108.28 02/09/2020   No results found for: PROLACTIN Lab Results  Component Value Date   CHOL 163 02/09/2020   TRIG 87 02/09/2020   HDL 59 02/09/2020   CHOLHDL 2.8 02/09/2020   VLDL 17 02/09/2020   LDLCALC 87 02/09/2020   Lab Results  Component Value Date   TSH 0.658 02/09/2020    Therapeutic Level Labs: No results found for: LITHIUM No results found for: CBMZ No results found for: VALPROATE  Current Medications: Current Outpatient Medications  Medication Sig Dispense Refill  . Amphetamine Sulfate (EVEKEO) 10 MG TABS Take 10 mg by mouth daily. 30 tablet 0  . ARIPiprazole (ABILIFY) 10 MG tablet Take 1 tablet (10 mg total) by mouth daily. 30 tablet 1  . busPIRone (BUSPAR) 30 MG tablet Take 1 tablet (30 mg total) by mouth 2 (two) times daily. 60 tablet 1   . Elagolix Sodium 150 MG TABS Take 150 mg by mouth daily. 30 tablet 11  . estradiol (ESTRACE) 2 MG tablet Take 1 tablet (2 mg total) by mouth daily. 90 tablet 3  . norethindrone (MICRONOR) 0.35 MG tablet TAKE 1 TABLET BY MOUTH EVERY DAY 84 tablet 2  . sertraline (ZOLOFT) 100 MG tablet Take 1.5 tablets (150 mg total) by mouth daily. 45 tablet 1   No current facility-administered medications for this visit.    Musculoskeletal: Strength & Muscle Tone: within normal limits Gait & Station: normal Patient leans: N/A  Psychiatric Specialty Exam: Review of Systems  There were no vitals taken for this visit.There is no height or weight on file to calculate BMI.  General Appearance: Unable to assess  Eye Contact:  Unable to assess  Speech:  Clear and Coherent and Normal Rate  Volume:  Normal  Mood:  Euthymic  Affect:  Blunt and Congruent  Thought Process:  Coherent, Linear and Descriptions of Associations: Intact  Orientation:  Full (  Time, Place, and Person)  Thought Content:  Logical  Suicidal Thoughts:  No  Homicidal Thoughts:  No  Memory:  Immediate;   Fair Recent;   Fair  Judgement:  Intact  Insight:  Present  Psychomotor Activity:  Normal  Concentration:  Concentration: Fair and Attention Span: Fair  Recall:  [employer] of Knowledge:Fair  Language: Fair  Akathisia:  No  Handed:  Right  AIMS (if indicated):  not done  Assets:  Communication Skills Desire for Improvement Financial Resources/Insurance Leisure Time Physical Health Social Support  ADL's:  Intact  Cognition: WNL  Sleep:  Fair   Screenings: AUDIT     Admission (Discharged) from 02/08/2020 in [facility] IN[facility]INE  Alcohol Use Disorder Identification Test Final Score (AUDIT) 0    PHQ2-9     Counselor from 04/02/2020 in BEHAVIORAL HEALTH PARTIAL HOSPITALIZATION PROGRAM Counselor from 03/15/2020 in BEHAVIORAL HEALTH PARTIAL HOSPITALIZATION PROGRAM  PHQ-2 Total Score 3 3  PHQ-9 Total Score 4 13       Assessment and Plan:  Patient enrolled in Intensive Outpatient Program. Patient is to continue his current medications. No dose adjustments are to be made. A comprehensive treatment plan will be developed and side effects of medications have been reviewed with patient.   Treatment options and alternatives reviewed with patient and patient understands the plan above.  Treatment plan was reviewed and patient agreed upon by nurse practitioner Caryn Bee and patient Stephanie Coup [place] need for group services.  Maryagnes Amos, FNP 6/23/202112:00 PM

## 2020-04-04 NOTE — Telephone Encounter (Signed)
Patient requests to be called at ph# [phone] re: Patient was told that a PA/RX for Michael [name] had been denied by patient's insurance ([employer]), however, patient spoke with insurance company who told her that they did not deny the PA/RX for the medication listed above.

## 2020-04-04 NOTE — Psych (Signed)
Virtual Visit via Video Note  I connected with Michael [name] on 04/02/20 at  9:00 AM EDT by a video enabled telemedicine application and verified that I am speaking with the correct person using two identifiers.  Location: Patient: Patient Home Provider: Clinical Home Office   I discussed the limitations of evaluation and management by telemedicine and the availability of in person appointments. The patient expressed understanding and agreed to proceed.  I discussed the assessment and treatment plan with the patient. The patient was provided an opportunity to ask questions and all were answered. The patient agreed with the plan and demonstrated an understanding of the instructions.   The patient was advised to call back or seek an in-person evaluation if the symptoms worsen or if the condition fails to improve as anticipated.  Pt was provided 240 minutes of non-face-to-face time during this encounter.   Michael Guiles, LCSW    [facility] PHP THERAPIST PROGRESS NOTE  Michael [name] [id number]  Session Time: 9:00 - 10:00  Participation Level: Active  Behavioral Response: CasualAlertDepressed  Type of Therapy: Group Therapy  Treatment Goals addressed: Coping  Interventions: CBT, DBT, Supportive and Reframing  Summary: Clinician led check-in regarding current stressors and situation, and review of patient completed daily inventory. Clinician utilized active listening and empathetic response and validated patient emotions. Clinician facilitated processing group on pertinent issues.   Therapist Response:Michael [name] "Michael [name]" is a 29 y.o. adult who presents with depression and anxiety symptoms. Patient arrived within time allowed and reports that she is feeling "a little tired." Patient rates her mood at a 6 on a scale of 1-10 with 10 being great. Pt reports she struggled with sleep and had "bizarre" dreams. Pt states she didn't leave the house this weekend and  focused on studying languages.  Pt engaged in discussion.        Session Time: 10:00-11:00  Participation Level:Active  Behavioral Response:CasualAlertDepressed  Type of Therapy:Group Therapy  Treatment Goals addressed: Coping  Interventions:CBT, DBT, Solution Focused, Supportive and Reframing  Summary: Cln led discussion on routine and the role it can play in adding structure into our day. Group members discussed their relationship with routine and the barriers they experience in creating/sticking with routine. Cln encouraged flexibility in their definition of routine and starting in smaller chunks of the day.    Therapist Response: Pt engaged in discussion and reports struggle with variation for routines. Pt is able to brainstorm new ideas to try.       Session Time: 11:00- 12:00  Participation Level:Active  Behavioral Response:CasualAlertDepressed  Type of Therapy: Group Therapy, OT  Treatment Goals addressed: Coping  Interventions:Psychosocial skills training, Supportive  Summary:Occupational Therapy group  Therapist Response:Patient engaged in group. See OT note.         Session Time: 12:00 -1:00  Participation Level:Active  Behavioral Response:CasualAlertDepressed  Type of Therapy:Group therapy  Treatment Goals addressed: Coping  Interventions:CBT; Solution focused; Supportive; Reframing  Summary:12:00 - 12:50: Cln led discussion on healthy thought patterns and utilized handout "11 beliefs that make life better" to aid discussion. Group reviewed the 11 beliefs and discussed which ones felt true and which they struggled with. Group read mantras to  attached to the belief to help instill the new ideas and discussed situations in which the mantras could be beneficial.  12:50 -1:00 Clinician led check-out. Clinician assessed for immediate needs, medication compliance and efficacy, and safety  concerns  Therapist Response:12:00 - 12:50: Pt engaged in discussion and reports struggling  most with control.  12:50 - 1:00: At check-out, patientrates hermood at a6 on a scale of 1-10 with 10 being great.Pt states afternoon plans of studying languages.Patient demonstratessomeprogress as evidenced by increased mood stability.Patient denies SI/HI/self-harm at the end of group.    Suicidal/Homicidal: Nowithout intent/plan  Plan: Pt will discharge from [facility] due to meeting treatment goals of decreased depression and anxiety symptoms, increased ability to manage symptoms in a healthy manner, and increased prosocial activities. Pt will step down to IOP within this agency on 6/22. Pt and provider are aligned with discharge. Pt denies SI/HI at time of discharge.    Diagnosis: Severe episode of recurrent major depressive disorder, without psychotic features ([place]) [F33.2]    1. Severe episode of recurrent major depressive disorder, without psychotic features ([place])   2. Generalized anxiety disorder   3. Autistic spectrum disorder       Michael [name], [place] 04/04/2020

## 2020-04-04 NOTE — Psych (Signed)
Virtual Visit via Video Note  I connected with Michael [name] on 03/29/20 at  9:00 AM EDT by a video enabled telemedicine application and verified that I am speaking with the correct person using two identifiers.  Location: Michael [name]: Michael [name] Home Provider: Clinical Home Office   I discussed the limitations of evaluation and management by telemedicine and the availability of in person appointments. The Michael [name] expressed understanding and agreed to proceed.  I discussed the assessment and treatment plan with the Michael [name]. The Michael [name] was provided an opportunity to ask questions and all were answered. The Michael [name] agreed with the plan and demonstrated an understanding of the instructions.   The Michael [name] was advised to call back or seek an in-person evaluation if the symptoms worsen or if the condition fails to improve as anticipated.  Pt was provided 240 minutes of non-face-to-face time during this encounter.   Michael Glass, LCSW    [facility] PHP THERAPIST PROGRESS NOTE  Michael [name] [id number]  Session Time: 9:00 - 10:00  Participation Level: Active  Behavioral Response: CasualAlertDepressed  Type of Therapy: Group Therapy  Treatment Goals addressed: Coping  Interventions: CBT, DBT, Supportive and Reframing  Summary: Clinician led check-in regarding current stressors and situation, and review of Michael [name] completed daily inventory. Clinician utilized active listening and empathetic response and validated Michael [name] emotions. Clinician facilitated processing group on pertinent issues.   Therapist Response:Michael M Wendell "[name]" is a 29 y.o. adult who presents with depression and anxiety symptoms. Michael [name] arrived within time allowed and reports that she is feeling "pretty normal." Michael [name] rates her mood at a 7 on a scale of 1-10 with 10 being great. 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 Pt  states she continues to sleep well and appetite remains low. Pt engaged in discussion.        Session Time: 10:00-11:00  Participation Level:Active  Behavioral Response:CasualAlertDepressed  Type of Therapy:Group Therapy  Treatment Goals addressed: Coping  Interventions:CBT, DBT, Solution Focused, Supportive and Reframing  Summary: Cln led discussion on the struggle with transitions in relationships. Group members shared ways in which their relationships are or have changed and growing pains they have experienced. Cln utilized thought challenging, boundaries, feelings, and control to shape conversation.    Therapist Response: Pt engaged in discussion and reports struggles with accepting some kinds of change. Pt able to process and give and receive support.        Session Time: 11:00- 12:00  Participation Level:Active  Behavioral Response:CasualAlertDepressed  Type of Therapy: Group Therapy, OT  Treatment Goals addressed: Coping  Interventions:Psychosocial skills training, Supportive  Summary:Occupational Therapy group  Therapist Response:Michael [name] engaged in group. See OT note.         Session Time: 12:00 -1:00  Participation Level:Active  Behavioral Response:CasualAlertDepressed  Type of Therapy:Group therapy  Treatment Goals addressed: Coping  Interventions:CBT; Solution focused; Supportive; Reframing  Summary:12:00 - 12:50: Cln continued topic of radical acceptance. Cln reviewed and elaborate on previously discussed aspects of radical acceptance. Cln utilized DBT distress tolerance handout 11 in discussion and group focused on "Why accept reality." Group shares struggles with the concept of raidcal acceptance and how this may look in every day life.  12:50 -1:00 Clinician led check-out. Clinician assessed for immediate needs, medication compliance and efficacy, and safety concerns  Therapist Response:12:00  - 12:50:  Pt engaged in discussion and reports this is something she has experience with and is open to trying  strategies discussed.  12:50 - 1:00: At check-out, patientrates hermood at [facility] on a scale of 1-10 with 10 being great.Pt states afternoon plans of studying languages.Michael [name] demonstratessomeprogress as evidenced by increased diversity in how she spends her time.Michael [name] denies SI/HI/self-harm at the end of group.    Suicidal/Homicidal: Nowithout intent/plan  Plan: Pt will continue in PHP while working to decrease depression and anxiety symptoms, increase ability to manage symptoms in a healthy manner, and increase prosocial activities.    Diagnosis: Severe episode of recurrent major depressive disorder, without psychotic features ([facility]) [F33.2]    1. Severe episode of recurrent major depressive disorder, without psychotic features ([facility])   2. Generalized anxiety disorder   3. Autistic spectrum disorder       Michael [name], [place] 04/04/2020

## 2020-04-04 NOTE — Progress Notes (Signed)
Virtual Visit via Video Note  I connected with Michael [name] on 04/04/20 at  9:00 AM EDT by a video enabled telemedicine application and verified that I am speaking with the correct person using two identifiers.  At orientation to the IOP program Case Manager discussed the limitations of evaluation and management by telemedicine and the availability of in person appointments. The patient expressed understanding and agreed to proceed with virtual visits.   Location:  Patient: Patient Home Provider: Home Office   History of Present Illness: _   Observations/Objective: Check In: Case Manager checked in with all participants to review discharge dates, insurance authorizations, work-related documents and needs for the treatment team, including medication review and assessment. Case Manager introduced new Client to the group, all welcomed and started the joining process.   Initial Therapeutic Activity: Counselor facilitated therapeutic processing with group members to assess mood and current functioning, prompting group members to share about application of skills, progress and challenges in treatment/personal lives. Client reports that she is "good" and has "no current issues to address". Lacked insight into activities of the previous day. Noted spending 6 hours studying [employer] in her room. Unsure if she interacted with others. Client presents with moderate depression and mild anxiety. Client denied any current SI/HI/psychosis.   Second Therapeutic Activity: Counselor engaged group in a discussion about "Social Awkwardness", as requested earlier in the week. Group members shared their struggles related to social anxiety, being introverted, interacting in public during and post-pandemic, and unhealthy cognitions. Client shared that she has Autism, therefore social interactions, anxieties and awkwardness have been present since childhood. Client noted ability to develop friendships through  common interest. Counselor provided psychoeducation with an article from [employer] and a video from [employer], to share skills and strategies to overcome social awkwardness.   Check Out: Counselor closed program prompting group members to share one self-care practice or productivity activity they plan to engage in today. Client plans to relax and eat 1 meal today.   Assessment and Plan: Clinician recommends that Client remain in IOP treatment to better manage mental health symptoms, stabilization and to address treatment plan goals. Clinician recommends adherence to crisis/safety plan, taking medications as prescribed, and following up with medical professionals if any issues arise.   Follow Up Instructions: Clinician will send Webex link for next session. The Client was advised to call back or seek an in-person evaluation if the symptoms worsen or if the condition fails to improve as anticipated.     I provided 180 minutes of non-face-to-face time during this encounter.     Hilbert Odor, LCSW

## 2020-04-05 ENCOUNTER — Other Ambulatory Visit (HOSPITAL_COMMUNITY): Payer: BC Managed Care – PPO | Admitting: Psychiatry

## 2020-04-05 ENCOUNTER — Encounter (HOSPITAL_COMMUNITY): Payer: Self-pay

## 2020-04-05 ENCOUNTER — Other Ambulatory Visit: Payer: Self-pay

## 2020-04-05 DIAGNOSIS — F332 Major depressive disorder, recurrent severe without psychotic features: Secondary | ICD-10-CM | POA: Diagnosis not present

## 2020-04-05 NOTE — Progress Notes (Signed)
Virtual Visit via Video Note  I connected with Michael [name] on 04/05/20 at  9:00 AM EDT by a video enabled telemedicine application and verified that I am speaking with the correct person using two identifiers.  At orientation to the IOP program Case Manager discussed the limitations of evaluation and management by telemedicine and the availability of in person appointments. The patient expressed understanding and agreed to proceed with virtual visits.   Location:  Patient: Patient Home Provider: Home Office   History of Present Illness: Severe, MDD with psychosis   Observations/Objective: Check In: Case Manager checked in with all participants to review discharge dates, insurance authorizations, work-related documents and needs for the treatment team, including medication review and assessment. Case Manager introduced new Client to the group, all welcomed and started the joining process. Counselor facilitated therapeutic processing with group members to assess mood and current functioning, prompting group members to share about application of skills, progress and challenges in treatment/personal lives. Client reports "feeling good", rested, relaxed and studied yesterday, no issues to report. Client presents with mild depression and mild anxiety. Client denied any current SI/HI/psychosis.    Initial Therapeutic Activity: Counselor introduced Sheppard Coil, [place] Chaplain to present information and discussion on Grief and Loss. Group members engaged in discussion, sharing how grief impacts them, what comforts them, what emotions are felt, labeling losses, etc. After guest speaker logged off, Counselor prompted group to spend 10-15 minutes journaling to process personal grief and loss situations. Counselor processed entries with group and client's identified areas for additional processing in individual therapy.  Second Therapeutic Activity: Counselor introduced Doctor, hospital, Alphia Moh from [facility], to share about their programs and services. Group members engaged in discussion, asked questions and named which programs they are most interested. Client chose [facility].   Final Therapeutic Activity: Counselor first engaged the group in an ice breaker for joining, by prompting them to name the animal they most identify with and why. Each group member shared, with client choosing a snake. Counselor introduced the concept of a Crisis/Safety Plan with the group, facilitating the creation of their own individual plans. Group unable to complete activity and will follow up tomorrow for processing and application. Client noted ruminations, inability to move out of repetitive thought patterns. She noted ability to rely on parents for support.    Check Out: Counselor closed program prompting group members to share one self-care practice or productivity activity they plan to engage in today. Client chose to relax and read [employer].  Assessment and Plan: Clinician recommends that Client remain in IOP treatment to better manage mental health symptoms, stabilization and to address treatment plan goals. Clinician recommends adherence to crisis/safety plan, taking medications as prescribed, and following up with medical professionals if any issues arise.   Follow Up Instructions: Clinician will send Webex link for next session. The Client was advised to call back or seek an in-person evaluation if the symptoms worsen or if the condition fails to improve as anticipated.     I provided 180 minutes of non-face-to-face time during this encounter.     Hilbert Odor, LCSW

## 2020-04-06 ENCOUNTER — Encounter (HOSPITAL_COMMUNITY): Payer: Self-pay

## 2020-04-06 ENCOUNTER — Other Ambulatory Visit: Payer: Self-pay

## 2020-04-06 ENCOUNTER — Other Ambulatory Visit (HOSPITAL_COMMUNITY): Payer: BC Managed Care – PPO | Admitting: Psychiatry

## 2020-04-06 ENCOUNTER — Other Ambulatory Visit: Payer: Self-pay | Admitting: Psychiatry

## 2020-04-06 DIAGNOSIS — F332 Major depressive disorder, recurrent severe without psychotic features: Secondary | ICD-10-CM

## 2020-04-06 NOTE — Progress Notes (Signed)
Virtual Visit via Video Note  I connected with Michael [name] on 04/06/20 at  9:00 AM EDT by a video enabled telemedicine application and verified that I am speaking with the correct person using two identifiers.  At orientation to the IOP program Case Manager discussed the limitations of evaluation and management by telemedicine and the availability of in person appointments. The patient expressed understanding and agreed to proceed with virtual visits.   Location:  Patient: Patient Home Provider: Home Office   History of Present Illness: MDD, severe   Observations/Objective: Check In: Case Manager checked in with all participants to review discharge dates, insurance authorizations, work-related documents and needs for the treatment team, including medication review and assessment.    Initial Therapeutic Activity: Counselor facilitated therapeutic processing with group members to assess mood and current functioning, prompting group members to share about application of skills, progress and challenges in treatment/personal lives. Client reports feeling peaceful and relaxed today. She discussed a desire to get back involved in volunteering in her community. Client presents with mild depression and mild anxiety. Client denied any current SI/HI/psychosis.   Second Therapeutic Activity: Counselor reviewed Steps 1-4 of the Crisis/Safety Plan with the group. Counselor shared emergency crisis number to add to the form and about access to those services. Group members proceeded to complete the document, including what makes their environment safe, motivations for living and who they plan to share their Crisis Plans with in the coming days and weeks. 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  Check Out: Counselor closed program prompting group members to share one self-care practice or productivity  activity they plan to engage in today. Client plans to connect with friends online over the weekend for socialization.  Assessment and Plan: Clinician recommends that Client remain in IOP treatment to better manage mental health symptoms, stabilization and to address treatment plan goals. Clinician recommends adherence to crisis/safety plan, taking medications as prescribed, and following up with medical professionals if any issues arise.   Follow Up Instructions: Clinician will send Webex link for next session. The Client was advised to call back or seek an in-person evaluation if the symptoms worsen or if the condition fails to improve as anticipated.     I provided 180 minutes of non-face-to-face time during this encounter.     Hilbert Odor, LCSW

## 2020-04-08 ENCOUNTER — Other Ambulatory Visit: Payer: Self-pay | Admitting: Family

## 2020-04-09 ENCOUNTER — Other Ambulatory Visit (HOSPITAL_COMMUNITY): Payer: BC Managed Care – PPO | Admitting: Psychiatry

## 2020-04-09 ENCOUNTER — Other Ambulatory Visit: Payer: Self-pay

## 2020-04-09 ENCOUNTER — Encounter (HOSPITAL_COMMUNITY): Payer: Self-pay

## 2020-04-09 DIAGNOSIS — F332 Major depressive disorder, recurrent severe without psychotic features: Secondary | ICD-10-CM | POA: Diagnosis not present

## 2020-04-09 NOTE — Progress Notes (Signed)
Virtual Visit via Video Note  I connected with Michael [name] on 04/09/20 at  9:00 AM EDT by a video enabled telemedicine application and verified that I am speaking with the correct person using two identifiers.  At orientation to the IOP program Case Manager discussed the limitations of evaluation and management by telemedicine and the availability of in person appointments. The patient expressed understanding and agreed to proceed with virtual visits.   Location:  Patient: Patient Home Provider: Home Office   History of Present Illness: MDD   Observations/Objective: Check In: Case Manager checked in with all participants to review discharge dates, insurance authorizations, work-related documents and needs for the treatment team, including medication review and assessment. Counselor facilitated therapeutic processing with group members to assess mood and current functioning, prompting group members to share about application of skills, progress and challenges in treatment/personal lives. Client reports that they are currently feeling sick with a cold, low energy. Client noted desire to participate in group, but at limited verbal engagement. Client noted socializing with peers over the weekend and spending meaningful time with family and grandparents. Client presents with mild depression and mild anxiety. Client denied any current SI/HI/psychosis.   Initial Therapeutic Activity: Counselor facilitated a discussion to identify current needs of the group and feedback on what is working and not working related to group processes. Group members identified that they would like information on the following topics: practical coping skills to combat depression/anxiety, practicing the skills together in session, social skill development, identity work/imposter syndrome, future planning for long-term maintenance of mental health/engagement in treatments. Group members also would like journal prompts and  time set aside during group to reflect. One group member would like more homework tasks to do for application of skills. Counselor to implement ideas and feedback.  Second Therapeutic Activity: Counselor engaged group in learning/practicing 8-10 Deep Breathing Techniques. Group members participated and engaged in the prompts. Group members gave feedback about which ones they found most relaxing or useful. Client identified all techniques as being helpful and relaxing.  Check Out: Counselor closed program prompting group members to share one self-care practice or productivity activity they plan to engage in today. Client plans to rest and recover from cold.  Assessment and Plan: Clinician recommends that Client remain in IOP treatment to better manage mental health symptoms, stabilization and to address treatment plan goals. Clinician recommends adherence to crisis/safety plan, taking medications as prescribed, and following up with medical professionals if any issues arise.   Follow Up Instructions: Clinician will send Webex link for next session. The Client was advised to call back or seek an in-person evaluation if the symptoms worsen or if the condition fails to improve as anticipated.     I provided 180 minutes of non-face-to-face time during this encounter.     Hilbert Odor, LCSW

## 2020-04-10 ENCOUNTER — Encounter (HOSPITAL_COMMUNITY): Payer: Self-pay

## 2020-04-10 ENCOUNTER — Other Ambulatory Visit: Payer: Self-pay

## 2020-04-10 ENCOUNTER — Other Ambulatory Visit (HOSPITAL_COMMUNITY): Payer: BC Managed Care – PPO | Admitting: Psychiatry

## 2020-04-10 DIAGNOSIS — F332 Major depressive disorder, recurrent severe without psychotic features: Secondary | ICD-10-CM

## 2020-04-10 NOTE — Progress Notes (Signed)
Virtual Visit via Video Note  I connected with Michael [name] on 04/10/20 at  9:00 AM EDT by a video enabled telemedicine application and verified that I am speaking with the correct person using two identifiers.  At orientation to the IOP program Case Manager discussed the limitations of evaluation and management by telemedicine and the availability of in person appointments. The patient expressed understanding and agreed to proceed with virtual visits.   Location:  Patient: Patient Home Provider: Home Office   History of Present Illness: MDD   Observations/Objective: Check In: Case Manager checked in with all participants to review discharge dates, insurance authorizations, work-related documents and needs for the treatment team, including medication review and assessment. Case Manager introduced 2 new clients to the program, encouraging others to welcome and start the joining process. Counselor facilitated therapeutic processing with group members to assess mood and current functioning, prompting group members to share about application of skills, progress and challenges in treatment/personal lives. Client reports that cold symptoms continue, noting that mood has not been impacted. Client presents with moderate depression and mild anxiety. Client denied any current SI/HI/psychosis.   Initial Therapeutic Activity: Counselor prompted group to reflect and journal on the following question: What unrealistic expectations do you have in your life? 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 Counselor allowed time to journal, then for open discussion amongst the group about their reflections. Counselor and group members shared strategies in shifting our expectations to be more realistic and to accept the things we cannot change/influence.  Second Therapeutic Activity: Counselor provided pyshcoeducation on the benefits of self-soothing techniques. Counselor described and  prompted clients to practice 8 self-soothing skills. Group members participated and engaged in the activities, giving feedback on immediate benefits and application in "real life" settings. Counselor provided the clients a link for homework from [employer]compass[employer].org, to complete a self-assessment on their current self-care practices.   Check Out: Counselor closed program prompting group members to share one self-care practice or productivity activity they plan to engage in today. Client plans to rest and recover from cold/illness.  Assessment and Plan: Clinician recommends that Client remain in IOP treatment to better manage mental health symptoms, stabilization and to address treatment plan goals. Clinician recommends adherence to crisis/safety plan, taking medications as prescribed, and following up with medical professionals if any issues arise.   Follow Up Instructions: Clinician will send Webex link for next session. The Client was advised to call back or seek an in-person evaluation if the symptoms worsen or if the condition fails to improve as anticipated.     I provided 180 minutes of non-face-to-face time during this encounter.     Hilbert Odor, LCSW

## 2020-04-11 ENCOUNTER — Encounter (HOSPITAL_COMMUNITY): Payer: Self-pay

## 2020-04-11 ENCOUNTER — Other Ambulatory Visit: Payer: Self-pay

## 2020-04-11 ENCOUNTER — Other Ambulatory Visit (HOSPITAL_COMMUNITY): Payer: BC Managed Care – PPO | Admitting: Psychiatry

## 2020-04-11 DIAGNOSIS — F332 Major depressive disorder, recurrent severe without psychotic features: Secondary | ICD-10-CM | POA: Diagnosis not present

## 2020-04-11 NOTE — Progress Notes (Signed)
Virtual Visit via Video Note  I connected with Michael [name] on 04/11/20 at  9:00 AM EDT by a video enabled telemedicine application and verified that I am speaking with the correct person using two identifiers.  At orientation to the IOP program Case Manager discussed the limitations of evaluation and management by telemedicine and the availability of in person appointments. The patient expressed understanding and agreed to proceed with virtual visits.   Location:  Patient: Patient Home Provider: Home Office   History of Present Illness: Severe MDD   Observations/Objective: Check In: Case Manager checked in with all participants to review discharge dates, insurance authorizations, work-related documents and needs for the treatment team, including medication review and assessment.    Initial Therapeutic Activity: Counselor introduced our guest speaker, Michael [name], [facility] Pharmacist, who shared about psychiatric medications, side effects, treatment considerations and how to communicate with medical professionals. Group Members asked questions and shared medication concerns. Counselor prompted group members to reference a worksheet called, "Body Scan" to jot down questions and concerns about their physical health in preparation for their upcoming appointments with medical professionals. Client noted ongoing management of childhood onset conditions. Client noted the need for scheduling a physical this year. Counselor encouraged routine medical check-ups, preparing for appointments, following up with recommendations and seeking specialist, if needed.   Second Therapeutic Activity: Counselor facilitated therapeutic processing with group members to assess mood and current functioning, prompting group members to share about application of skills, progress and challenges in treatment/personal lives. Client reports continuation of cold symptoms, noting that mental health has not been impacted,  remaining optimistic and positive with progress and prognosis. Client presents with mild depression and mild anxiety. Client denied any current SI/HI/psychosis. Counselor provided Group with a Self-Awareness Assessment to work on for homework in preparation for our session tomorrow.  Check Out: Counselor closed program prompting group members to share one self-care practice or productivity activity they plan to engage in today. Client plans to continue recovery efforts to combat common cold symptoms.  Assessment and Plan: Clinician recommends that Client remain in IOP treatment to better manage mental health symptoms, stabilization and to address treatment plan goals. Clinician recommends adherence to crisis/safety plan, taking medications as prescribed, and following up with medical professionals if any issues arise.   Follow Up Instructions: Clinician will send Webex link for next session. The Client was advised to call back or seek an in-person evaluation if the symptoms worsen or if the condition fails to improve as anticipated.     I provided 180 minutes of non-face-to-face time during this encounter.     Hilbert Odor, LCSW

## 2020-04-12 ENCOUNTER — Other Ambulatory Visit: Payer: Self-pay

## 2020-04-12 ENCOUNTER — Encounter (HOSPITAL_COMMUNITY): Payer: Self-pay

## 2020-04-12 ENCOUNTER — Other Ambulatory Visit (HOSPITAL_COMMUNITY): Payer: BC Managed Care – PPO | Attending: Psychiatry | Admitting: Psychiatry

## 2020-04-12 DIAGNOSIS — F411 Generalized anxiety disorder: Secondary | ICD-10-CM

## 2020-04-12 DIAGNOSIS — F909 Attention-deficit hyperactivity disorder, unspecified type: Secondary | ICD-10-CM | POA: Diagnosis not present

## 2020-04-12 DIAGNOSIS — F329 Major depressive disorder, single episode, unspecified: Secondary | ICD-10-CM | POA: Insufficient documentation

## 2020-04-12 DIAGNOSIS — R278 Other lack of coordination: Secondary | ICD-10-CM | POA: Insufficient documentation

## 2020-04-12 DIAGNOSIS — R45851 Suicidal ideations: Secondary | ICD-10-CM | POA: Diagnosis not present

## 2020-04-12 DIAGNOSIS — F840 Autistic disorder: Secondary | ICD-10-CM | POA: Diagnosis not present

## 2020-04-12 DIAGNOSIS — R480 Dyslexia and alexia: Secondary | ICD-10-CM | POA: Insufficient documentation

## 2020-04-12 DIAGNOSIS — F332 Major depressive disorder, recurrent severe without psychotic features: Secondary | ICD-10-CM

## 2020-04-12 DIAGNOSIS — R441 Visual hallucinations: Secondary | ICD-10-CM | POA: Insufficient documentation

## 2020-04-12 DIAGNOSIS — R440 Auditory hallucinations: Secondary | ICD-10-CM | POA: Diagnosis not present

## 2020-04-12 DIAGNOSIS — F640 Transsexualism: Secondary | ICD-10-CM | POA: Insufficient documentation

## 2020-04-12 NOTE — Progress Notes (Signed)
Virtual Visit via Video Note  I connected with Michael [name] on 04/12/20 at  9:00 AM EDT by a video enabled telemedicine application and verified that I am speaking with the correct person using two identifiers.  At orientation to the IOP program Case Manager discussed the limitations of evaluation and management by telemedicine and the availability of in person appointments. The patient expressed understanding and agreed to proceed with virtual visits.   Location:  Patient: Patient Home Provider: Home Office   History of Present Illness: MDD   Observations/Objective: Check In: Case Manager checked in with all participants to review discharge dates, insurance authorizations, work-related documents and needs for the treatment team, including medication review and assessment. Counselor facilitated therapeutic processing with group members to assess mood and current functioning, prompting group members to share about application of skills, progress and challenges in treatment/personal lives. Client reports continuation of sickness, being in bed most of the week. She notes interaction with family and being able to eat, care for basic needs. 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 Client presents with mild depression and mild anxiety. Client denied any current SI/HI/psychosis.    Initial Therapeutic Activity: Counselor prompted group members to reflect on and journal about their personal mission statement. Counselor gave the group examples and how to guide in writing the statements. Group members shared their mission statements aloud and discussed meaning behind the statements. Client statement: "We are our deeds, and learning is important."  Second Therapeutic Activity: Counselor reviewed the Self-Awareness Assessment Worksheet with clients that was given for homework. Clients shared their progress and areas of  struggle. Counselor provided psychoeducation, benefits and strategies for doing self-assessments and self-reflection. Group members identified a need to spend more time in relearning and rediscovering themselves, during the vary stages of life they are in. Client noted that they were unable to complete for homework, due to physical illness/condition, but will work on it in the future.  Check Out: Counselor closed program prompting group members to share one self-care practice or productivity activity they plan to engage in today. Client plans to rest and recover from sickness.  Assessment and Plan: Clinician recommends that Client remain in IOP treatment to better manage mental health symptoms, stabilization and to address treatment plan goals. Clinician recommends adherence to crisis/safety plan, taking medications as prescribed, and following up with medical professionals if any issues arise.   Follow Up Instructions: Clinician will send Webex link for next session. The Client was advised to call back or seek an in-person evaluation if the symptoms worsen or if the condition fails to improve as anticipated.     I provided 180 minutes of non-face-to-face time during this encounter.     Hilbert Odor, LCSW

## 2020-04-13 ENCOUNTER — Other Ambulatory Visit (HOSPITAL_COMMUNITY): Payer: BC Managed Care – PPO | Admitting: Psychiatry

## 2020-04-13 ENCOUNTER — Encounter (HOSPITAL_COMMUNITY): Payer: Self-pay

## 2020-04-13 ENCOUNTER — Other Ambulatory Visit: Payer: Self-pay

## 2020-04-13 DIAGNOSIS — F332 Major depressive disorder, recurrent severe without psychotic features: Secondary | ICD-10-CM

## 2020-04-13 DIAGNOSIS — F329 Major depressive disorder, single episode, unspecified: Secondary | ICD-10-CM | POA: Diagnosis not present

## 2020-04-13 NOTE — Progress Notes (Signed)
Virtual Visit via Video Note  I connected with Michael [name] on 04/13/20 at  9:00 AM EDT by a video enabled telemedicine application and verified that I am speaking with the correct person using two identifiers.  At orientation to the IOP program Case Manager discussed the limitations of evaluation and management by telemedicine and the availability of in person appointments. The patient expressed understanding and agreed to proceed with virtual visits.   Location:  Patient: Patient Home Provider: Home Office   History of Present Illness: MDD   Observations/Objective: Check In: Case Manager checked in with all participants to review discharge dates, insurance authorizations, work-related documents and needs for the treatment team, including medication review and assessment. Counselor facilitated therapeutic processing with group members to assess mood and current functioning, prompting group members to share about application of skills, progress and challenges in treatment/personal lives. Client reports that she is emotionally and mentally doing ok, however, remains sick from common cold/allergies. Feels good enough to attend group. Client presents with mild depression and mild anxiety. Client denied any current SI/HI/psychosis.    Initial Therapeutic Activity: Counselor prompted group members to reflect on and journal about the people who they look up to or are inspired by. Group Members each shared their response, which sparked conversation about who is looking up to them, as well as how they view and share about their mental health in context. Client identified no one in particular as their inspiration, noting that she needs more time to consider.  Second Therapeutic Activity: Counselor engaged the group in a guided imagery, which incorporated deep breathing, positive affirmations, muscle relaxation and visualizations. The guided imagery was titled Relaxation for Positive Self-Image.  Client reported feeling relaxed and energized.  Check Out: Counselor closed program prompting group members to share one self-care practice or productivity activity they plan to engage in today. Client plans to continue to get better physically in hopes to spend time with friends over the long weekend. Client confirmed adherence to safety plan, no safety issues to report.  Assessment and Plan: Clinician recommends that Client remain in IOP treatment to better manage mental health symptoms, stabilization and to address treatment plan goals. Clinician recommends adherence to crisis/safety plan, taking medications as prescribed, and following up with medical professionals if any issues arise.   Follow Up Instructions: Clinician will send Webex link for next session. The Client was advised to call back or seek an in-person evaluation if the symptoms worsen or if the condition fails to improve as anticipated.     I provided 180 minutes of non-face-to-face time during this encounter.     Hilbert Odor, LCSW

## 2020-04-17 ENCOUNTER — Other Ambulatory Visit (HOSPITAL_COMMUNITY): Payer: BC Managed Care – PPO | Admitting: Psychiatry

## 2020-04-17 ENCOUNTER — Encounter (HOSPITAL_COMMUNITY): Payer: Self-pay

## 2020-04-17 ENCOUNTER — Other Ambulatory Visit: Payer: Self-pay

## 2020-04-17 DIAGNOSIS — F329 Major depressive disorder, single episode, unspecified: Secondary | ICD-10-CM | POA: Diagnosis not present

## 2020-04-17 DIAGNOSIS — F332 Major depressive disorder, recurrent severe without psychotic features: Secondary | ICD-10-CM

## 2020-04-17 NOTE — Progress Notes (Signed)
Virtual Visit via Video Note  I connected with Michael [name] on 04/17/20 at  9:00 AM EDT by a video enabled telemedicine application and verified that I am speaking with the correct person using two identifiers.  At orientation to the IOP program Case Manager discussed the limitations of evaluation and management by telemedicine and the availability of in person appointments. The patient expressed understanding and agreed to proceed with virtual visits.   Location:  Patient: Patient Home Provider: Home Office   History of Present Illness: MDD   Observations/Objective: Check In: Case Manager checked in with all participants to review discharge dates, insurance authorizations, work-related documents and needs for the treatment team, including medication review and assessment. Case Manager introduced 2 new clients, group members welcomed and began the joining process.    Initial Therapeutic Activity: Counselor facilitated therapeutic processing with group members to assess mood and current functioning, prompting group members to share about application of skills, progress and challenges in treatment/personal lives. Client reports engaging in self-care practices and challenged self to engage with friends in person and virtually. Client was in a better mood and more interactive during session. Client presents with mild depression and mild anxiety. Client denied any current SI/HI/psychosis.  Second Therapeutic Activity: Counselor facilitated a Pharmacist, hospital, prompting group members to share are various resources they have used that are helpful in managing their mental health symptoms. Counselor collected resources provided by the group and added them to an exhaustive list of global, international, state, and local resources they can access as they continue with management of mental health. Counselor requested each group member to look over list and report back on 2-3 resources they plan to access  now or in the future.   Check Out: Counselor closed program prompting group members to share one self-care practice or productivity activity they plan to engage in today. Client plans to relax for a couple hours then work on graduate school applications. Client confirmed adherence to safety plan, no safety issues to report.  Assessment and Plan: Clinician recommends that Client remain in IOP treatment to better manage mental health symptoms, stabilization and to address treatment plan goals. Clinician recommends adherence to crisis/safety plan, taking medications as prescribed, and following up with medical professionals if any issues arise.   Follow Up Instructions: Clinician will send Webex link for next session. The Client was advised to call back or seek an in-person evaluation if the symptoms worsen or if the condition fails to improve as anticipated.     I provided 180 minutes of non-face-to-face time during this encounter.     Hilbert Odor, LCSW

## 2020-04-18 ENCOUNTER — Other Ambulatory Visit (HOSPITAL_COMMUNITY): Payer: BC Managed Care – PPO | Admitting: Psychiatry

## 2020-04-18 ENCOUNTER — Other Ambulatory Visit: Payer: Self-pay

## 2020-04-18 ENCOUNTER — Encounter (HOSPITAL_COMMUNITY): Payer: Self-pay | Admitting: Family

## 2020-04-18 DIAGNOSIS — F329 Major depressive disorder, single episode, unspecified: Secondary | ICD-10-CM | POA: Diagnosis not present

## 2020-04-18 DIAGNOSIS — F332 Major depressive disorder, recurrent severe without psychotic features: Secondary | ICD-10-CM

## 2020-04-18 DIAGNOSIS — F411 Generalized anxiety disorder: Secondary | ICD-10-CM

## 2020-04-18 NOTE — Progress Notes (Signed)
Virtual Visit via Telephone Note  I connected with Dre Gamino Rao on 04/18/20 at  9:00 AM EDT by telephone and verified that I am speaking with the correct person using two identifiers.  Location: Patient: Michael [name] Provider: Caryn Bee    I discussed the limitations, risks, security and privacy concerns of performing an evaluation and management service by telephone and the availability of in person appointments. I also discussed with the patient that there may be a patient responsible charge related to this service. The patient expressed understanding and agreed to proceed.    I discussed the assessment and treatment plan with the patient. The patient was provided an opportunity to ask questions and all were answered. The patient agreed with the plan and demonstrated an understanding of the instructions.   The patient was advised to call back or seek an in-person evaluation if the symptoms worsen or if the condition fails to improve as anticipated.  I provided 30 minutes of non-face-to-face time during this encounter.   Maryagnes Amos, FNP  [facility] Health Intensive Outpatient Program Discharge Summary  Michael [name] [id number]  Admission date: 04/03/2020 Discharge date: 04/19/2020  Reason for admission: Pt was inpt due to SI and increased depression. Pt denies current SI/HI- pt reports decreased AVH since started Abilify: pt reports she was hearing voices tell her she is a stalker, a piece of garbage, and would better off not alive (This cln believes it is most likely internal, negative voice, not psychosis). Pt reports seeing "[name] at [facility]. Pt reports she was seeing a counselor but insurance wouldn't continue to pay for it so does not have current counselor. Pt denies previous attempts and hospitalizations; pt reports recent self-harm act of punching self "a few weeks ago."No current thoughts of SH.  Chemical Use  History: Denies  Family of Origin Issues: Pt reports following stressors: 1) Decreased support: Pt reports she lost her "community"recently when they called her a stalker and then blocked her on all forms of communication. Pt reports she is unsure why this happened. 2) Diagnoses: Pt is diagnosed with dyslexia and dysgraphia, Autism, ADHD, and depression. 3) School: Pt is scheduled to graduate with bachelors degree in Criminology soon. Pt is trying to get into grad school. 4) Gender Identity: Pt is transgender MTF. Pt reports insurance will not cover all medications needed for treatment.  Progress in Program Toward Treatment Goals: Ongoing, patient attended and participated with daily group session with active and engaged participation.  Attributes their coping skills to attending intensive outpatient therapy.  They are denying suicidal or homicidal ideations.  Reports much improvement in their auditory and visual hallucinations. Patient was started on antidepressant Zoloft, Buspar and Abilify.  Denying any medication side effects during this assessment.   Progress (rationale): Patient is to follow up with outpatient therapy. Referral has been placed to [facility] for therapy. Patient is a previous patient of Dr. Alvis Lemmings, whom she plans to return to for medication management. Patient is encouraged to take medications daily.    [facility] 04/18/2020

## 2020-04-18 NOTE — Progress Notes (Addendum)
Virtual Visit via Video Note  I connected with Michael [name] on 04/18/20 at  9:00 AM EDT by a video enabled telemedicine application and verified that I am speaking with the correct person using two identifiers.  At orientation to the IOP program Case Manager discussed the limitations of evaluation and management by telemedicine and the availability of in person appointments. The patient expressed understanding and agreed to proceed with virtual visits.   Location:  Patient: Patient Home Provider: Home Office   History of Present Illness: MDD   Observations/Objective: Check In: Case Manager checked in with all participants to review discharge dates, insurance authorizations, work-related documents and needs for the treatment team, including medication review and assessment.    Initial Therapeutic Activity/Processing: Counselor facilitated therapeutic processing with group members to assess mood and current functioning, prompting group members to share about application of skills, progress and challenges in treatment/personal lives. Client reports that she has completely recovered from illness that started last week. Client is looking forward to working on preparation documents for graduate school, discussing expectations and challenges in the program. Client presented as calm, alert, engaged and stable. Client presents with mild depression and mild anxiety. Client denied any current SI/HI/psychosis.  Second Therapeutic Activity: Counselor provided group with psychoeducation on "Structuring Your Day" to reduce anxiety, combat depression, alleviate mental stress and to regain control of personal lives. Counselor shared information from 3 sources, prompting clients to begin creating a daily and weekly routine utilizing strategies discussed in articles. Counselor allowed time for group members to share other resources/tips within the group. Client stated that she would try the [employer]  Books and working on Pharmacist, community.  Check Out: Counselor closed program prompting group members to share one self-care practice or productivity activity they plan to engage in today. Client plans to relax and work on essay for graduate school. Client confirmed adherence to safety plan, no safety issues to report. Counselor allowed time to celebrate a graduating group member. Counselor shared reflections on progress and allow space for group members to share well wishes and appreciates to the graduating client. Counselor prompted graduating client to share takeaways, reflect on progress and final thoughts for the group.  Assessment and Plan: Clinician recommends that Client remain in IOP treatment to better manage mental health symptoms, stabilization and to address treatment plan goals. Clinician recommends adherence to crisis/safety plan, taking medications as prescribed, and following up with medical professionals if any issues arise.   Follow Up Instructions: Clinician will send Webex link for next session. The Client was advised to call back or seek an in-person evaluation if the symptoms worsen or if the condition fails to improve as anticipated.     I provided 180 minutes of non-face-to-face time during this encounter.     Hilbert Odor, LCSW

## 2020-04-19 ENCOUNTER — Encounter (HOSPITAL_COMMUNITY): Payer: Self-pay | Admitting: Psychiatry

## 2020-04-19 ENCOUNTER — Other Ambulatory Visit (HOSPITAL_COMMUNITY): Payer: BC Managed Care – PPO | Admitting: Psychiatry

## 2020-04-19 ENCOUNTER — Other Ambulatory Visit: Payer: Self-pay

## 2020-04-19 DIAGNOSIS — F411 Generalized anxiety disorder: Secondary | ICD-10-CM

## 2020-04-19 DIAGNOSIS — F329 Major depressive disorder, single episode, unspecified: Secondary | ICD-10-CM | POA: Diagnosis not present

## 2020-04-19 NOTE — Progress Notes (Signed)
Virtual Visit via Video Note  I connected with Michael [name] on 04/19/20 at  9:00 AM EDT by a video enabled telemedicine application and verified that I am speaking with the correct person using two identifiers.  At orientation to the IOP program Case Manager discussed the limitations of evaluation and management by telemedicine and the availability of in person appointments. The patient expressed understanding and agreed to proceed with virtual visits.   Location:  Patient: Patient Home Provider: Home Office   History of Present Illness: MDD   Observations/Objective: Check In: Case Manager checked in with all participants to review discharge dates, insurance authorizations, work-related documents and needs for the treatment team, including medication review and assessment. Counselor facilitated therapeutic processing with group members to assess mood and current functioning, prompting group members to share about application of skills, progress and challenges in treatment/personal lives. Client reports that they are now set up with a therapist and is looking forward to doing more individual work related to episode of psychosis. Client reports feeling good about stepping down today. Client presents with mild depression and mild anxiety. Client denied any current SI/HI/psychosis.   Initial Therapeutic Activity/Processing: Counselor introduced Janetta Hora, [facility] to present information and discussion on Grief and Loss. Group members engaged in discussion, sharing how grief impacts them, what comforts them, what emotions are felt, labeling losses, etc. After guest speaker logged off, Counselor prompted group to spend 10-15 minutes journaling to process personal grief and loss situations. Counselor processed entries with group and client's identified areas for additional processing in individual therapy. Client noted loss of friendships as a place of grief for her.  Second Therapeutic  Activity: To alleviate grief response, after break Group Members were prompted to share about their favorite movie or tv shows that serve as an escape or for entertainment. Client shared that she enjoys scary movies.Counselor provided group with two exhaustive lists of coping skills that were broken down into categories of relevance. Counselor prompted Group Members to choose 3 each that they would be willing to try or implement in prevention or crisis divergence to reduce mental health symptoms and responses. Client stated that all were helpful to consider.  Check Out: Counselor allowed time to celebrate 2 graduating group members. Counselor shared reflections on progress and allow space for group members to share well wishes and appreciates to the graduating client. Counselor prompted graduating client to share takeaways, reflect on progress and final thoughts for the group. Client shared takeaways were to stick to what you know works, that she feels and looks better since in treatment, and to keep strong through the difficult moments. Client confirmed adherence to safety plan, no safety issues to report  Assessment and Plan: Clinician recommends that Client step down from IOP treatment to continue management of mental health symptoms and to address treatment plan goals. Clinician recommends adherence to crisis/safety plan, taking medications as prescribed, and following up with medical professionals if any issues arise.   Follow Up Instructions: Clinician will send Webex link for next session. The Client was advised to call back or seek an in-person evaluation if the symptoms worsen or if the condition fails to improve as anticipated.     I provided 180 minutes of non-face-to-face time during this encounter.     Hilbert Odor, LCSW

## 2020-04-19 NOTE — Patient Instructions (Signed)
D:  Patient completed MH-IOP today.  A:  Discharge today.  Follow up with Dr. Alvis Lemmings (psychiatrist) on 05-10-20 a.m. and Nathen May (therapist) at [facility] [phone].  Call them if you don't hear from them soon.  Encouraged support groups.  R:  Patient receptive.

## 2020-04-19 NOTE — Psych (Signed)
Virtual Visit via Video Note  I connected with Michael [name] on 03/26/20 at  9:00 AM EDT by a video enabled telemedicine application and verified that I am speaking with the correct person using two identifiers.  Location: Patient: Patient Home Provider: Clinical Home Office   I discussed the limitations of evaluation and management by telemedicine and the availability of in person appointments. The patient expressed understanding and agreed to proceed.  I discussed the assessment and treatment plan with the patient. The patient was provided an opportunity to ask questions and all were answered. The patient agreed with the plan and demonstrated an understanding of the instructions.   The patient was advised to call back or seek an in-person evaluation if the symptoms worsen or if the condition fails to improve as anticipated.  Pt was provided 240 minutes of non-face-to-face time during this encounter.   Michael [name], Michael [name]    Michael [name] [name] PHP THERAPIST PROGRESS NOTE  Michael [name] [id number]  Session Time: 9:00 - 10:00  Participation Level: Active  Behavioral Response: CasualAlertDepressed  Type of Therapy: Group Therapy  Treatment Goals addressed: Coping  Interventions: CBT, DBT, Supportive and Reframing  Summary: Clinician led check-in regarding current stressors and situation, and review of patient completed daily inventory. Clinician utilized active listening and empathetic response and validated patient emotions. Clinician facilitated processing group on pertinent issues.   Therapist Response:Michael [name] "[name]" is a 29 y.o. adult who presents with depression and anxiety symptoms. Patient arrived within time allowed and reports that she is feeling "groggy." Patient rates her mood at a 7 on a scale of 1-10 with 10 being great. Pt states her afternoon went well and a friend came over and they took a walk. Pt shares she is applying for the GRE and is  struggling with her feelings about the need to take it. Pt able to process.  Pt engaged in discussion.        Session Time: 10:00-11:00  Participation Level:Active  Behavioral Response:CasualAlertDepressed  Type of Therapy:Group Therapy  Treatment Goals addressed: Coping  Interventions:CBT, DBT, Solution Focused, Supportive and Reframing  Summary: Cln continued topic of cognitive distortions and introduced ways to "challenge" identified distorted thoughts through use of CBT strategies. Cln discussed "checking the facts" and other was to access logic when in the midst of irrational thoughts. Group members shared ways they have thought challenged in the past and how it can help them moving forward.   Therapist Response:  Pt engaged in discussion and reports understanding of thought challenging.       Session Time: 11:00- 12:00  Participation Level:Active  Behavioral Response:CasualAlertDepressed  Type of Therapy: Group Therapy, OT  Treatment Goals addressed: Coping  Interventions:Psychosocial skills training, Supportive  Summary:Occupational Therapy group  Therapist Response:Patient engaged in group. See OT note.         Session Time: 12:00 -1:00  Participation Level:Active  Behavioral Response:CasualAlertDepressed  Type of Therapy:Group therapy  Treatment Goals addressed: Coping  Interventions:CBT; Solution focused; Supportive; Reframing  Summary:12:00 - 12:50: Clinician showed "How to practice emotional first aid" TED talk. Group discussed the video. Topics include perception of events affect response and self-talk, dangers of loneliness, and how perceived failure can continue to affect self in future circumstances. .  12:50 -1:00 Clinician led check-out. Clinician assessed for immediate needs, medication compliance and efficacy, and safety concerns  Therapist Response:12:00 - 12:50: Pt engaged in discussion  and reports relating mental health not being understood in [place], feeling as  though she does not deserve grace, and how loneliness can be devastating in many aspects of life. Pt reports wanting to focus on giving grace to self.  12:50 - 1:00: At check-out, patient rates her mood at a 7 on a scale of 1-10 with 10 being great. Pt states afternoon plans of emailing grad school for more information and working on scripts. Patient demonstrates some progress as evidenced by increased ability to identify areas to work on. Patient denies SI/HI/self-harm at the end of group.    Suicidal/Homicidal: Nowithout intent/plan  Plan: Pt will continue in PHP while working to decrease depression and anxiety symptoms, increase ability to manage symptoms in a healthy manner, and increase prosocial activities.    Diagnosis: Severe episode of recurrent major depressive disorder, without psychotic features (HCC) [F33.2]    1. Severe episode of recurrent major depressive disorder, without psychotic features (HCC)   2. Generalized anxiety disorder   3. Autistic spectrum disorder       Michael [name], [facility] 03/26/20

## 2020-04-19 NOTE — Psych (Signed)
Virtual Visit via Video Note  I connected with Michael [name] on 03/27/20 at  9:00 AM EDT by a video enabled telemedicine application and verified that I am speaking with the correct person using two identifiers.  Location: Patient: Patient Home Provider: Clinical Home Office   I discussed the limitations of evaluation and management by telemedicine and the availability of in person appointments. The patient expressed understanding and agreed to proceed.  I discussed the assessment and treatment plan with the patient. The patient was provided an opportunity to ask questions and all were answered. The patient agreed with the plan and demonstrated an understanding of the instructions.   The patient was advised to call back or seek an in-person evaluation if the symptoms worsen or if the condition fails to improve as anticipated.  Pt was provided 240 minutes of non-face-to-face time during this encounter.   Michael [name], [facility]    [facility] PHP THERAPIST PROGRESS NOTE  Michael [name]  Session Time: 9:00 - 10:00  Participation Level: Active  Behavioral Response: CasualAlertDepressed  Type of Therapy: Group Therapy  Treatment Goals addressed: Coping  Interventions: CBT, DBT, Supportive and Reframing  Summary: Clinician led check-in regarding current stressors and situation, and review of patient completed daily inventory. Clinician utilized active listening and empathetic response and validated patient emotions. Clinician facilitated processing group on pertinent issues.   Therapist Response:Michael [name] "Michael [name]" is a 29 y.o. adult who presents with depression and anxiety symptoms. Patient arrived within time allowed and reports that she is feeling "alright." Patient rates her mood at a 7 on a scale of 1-10 with 10 being great. Pt reports sleeping fine and an OK appetite. Pt reports she is trying to get herself back into a routine. Pt able to  process. Pt engaged in discussion.         Session Time: 10:00 -11:00   Participation Level: Active   Behavioral Response: CasualAlertDepressed   Type of Therapy: Group Therapy   Treatment Goals addressed: Coping   Interventions: CBT, DBT, Solution Focused, Supportive and Reframing   Summary: Cln introduced topic of fair fighting rules. Group discussed.   Therapist Response: Pt engaged in discussion. Pt reports she struggles with "fair fighting" and will use techniques in the future.       Session Time: 11:00- 12:00   Participation Level: Active   Behavioral Response: CasualAlertDepressed   Type of Therapy: Group Therapy, OT   Treatment Goals addressed: Coping   Interventions: Psychosocial skills training, Supportive   Summary: Occupational Therapy group   Therapist Response: Patient engaged in group. See OT note.            Session Time: 12:00 -1:00   Participation Level: Active   Behavioral Response: CasualAlertDepressed   Type of Therapy: Group therapy   Treatment Goals addressed: Coping   Interventions: CBT; Solution focused; Supportive; Reframing   Summary: 12:00 - 12:50: Clinician introduced the topic of "Radical Acceptance". Patients identified and discussed areas of life where radical acceptance would be useful.  12:50 -1:00 Clinician led check-out. Clinician assessed for immediate needs, medication compliance and efficacy, and safety concerns   Therapist Response: 12:00 - 12:50: Pt engaged in discussion and reports relating to "should" and fighting against things she does not have control over.  12:50 -1:00 Clinician led check-out. Clinician assessed for immediate needs, medication compliance and efficacy, and safety concerns.  Therapist Response: At check-out, patient rates her mood at a 7 on a scale of  1-10 with 10 being great. Pt states afternoon plans of finishing editing a video and relaxing. Patient demonstrates some progress as evidenced by  increased ability to identify triggers. Patient denies SI/HI/self-harm at the end of group.    Suicidal/Homicidal: Nowithout intent/plan  Plan: Pt will continue in PHP while working to decrease depression and anxiety symptoms, increase ability to manage symptoms in a healthy manner, and increase prosocial activities.    Diagnosis: Severe episode of recurrent major depressive disorder, without psychotic features (HCC) [F33.2]    1. Severe episode of recurrent major depressive disorder, without psychotic features (HCC)   2. Generalized anxiety disorder   3. Autistic spectrum disorder       Michael [name], [facility] 03/27/2020

## 2020-04-19 NOTE — Psych (Signed)
Virtual Visit via Video Note  I connected with Michael [name] on 03/28/20 at  9:00 AM EDT by a video enabled telemedicine application and verified that I am speaking with the correct person using two identifiers.  Location: Patient: Patient Home Provider: Clinical Home Office   I discussed the limitations of evaluation and management by telemedicine and the availability of in person appointments. The patient expressed understanding and agreed to proceed.  I discussed the assessment and treatment plan with the patient. The patient was provided an opportunity to ask questions and all were answered. The patient agreed with the plan and demonstrated an understanding of the instructions.   The patient was advised to call back or seek an in-person evaluation if the symptoms worsen or if the condition fails to improve as anticipated.  Pt was provided 240 minutes of non-face-to-face time during this encounter.   Michael [name], [facility]    [facility] PHP THERAPIST PROGRESS NOTE  Michael [name] [id number]  Session Time: 9:00 - 10:00  Participation Level: Active  Behavioral Response: CasualAlertDepressed  Type of Therapy: Group Therapy  Treatment Goals addressed: Coping  Interventions: CBT, DBT, Supportive and Reframing  Summary: Clinician led check-in regarding current stressors and situation, and review of patient completed daily inventory. Clinician utilized active listening and empathetic response and validated patient emotions. Clinician facilitated processing group on pertinent issues.   Therapist Response:Michael [name] "[name]" is a 29 y.o. adult who presents with depression and anxiety symptoms. Patient arrived within time allowed and reports that she is feeling "good." Patient rates her mood at a 7 on a scale of 1-10 with 10 being great. Pt reports meeting her goal of video editing yesterday so feels accomplished. Pt reports feeling rested after 7 hours of  sleep. Pt seeing improvement. Pt engaged in discussion.         Session Time: 10:00 -11:00   Participation Level: Active   Behavioral Response: CasualAlertDepressed   Type of Therapy: Group Therapy   Treatment Goals addressed: Coping   Interventions: CBT, DBT, Solution Focused, Supportive and Reframing   Summary: Cln introduced topic of distractions for continued help to manage symptoms as well as how to find moments of motivation. Group discussed.   Therapist Response: Pt engaged in discussion. Pt reports needing to find new distractions instead of falling back on old ones. Pt reports lacking motivation to follow-up at times, but has seen progress since starting group.         Session Time: 11:00 -12:00   Participation Level: Active   Behavioral Response: CasualAlertDepressed   Type of Therapy: Group Therapy, psychotherapy   Treatment Goals addressed: Coping   Interventions: Strengths based, reframing, Supportive,    Summary:  Spiritual Care group   Therapist Response: Patient engaged in group. See chaplain note.     Session Time: 12:00 -1:00   Participation Level: Active   Behavioral Response: CasualAlertDepressed   Type of Therapy: Group therapy   Treatment Goals addressed: Coping   Interventions: CBT; Solution focused; Supportive; Reframing   Summary: 12:00 - 12:50: See OT Note  12:50 -1:00 Clinician led check-out. Clinician assessed for immediate needs, medication compliance and efficacy, and safety concerns   Therapist Response: 12:00 - 12:50: See OT Note At check-out, patient rates her mood at a 7 on a scale of 1-10 with 10 being great. Pt states afternoon plans of relaxing and learning [place]. Patient demonstrates some progress as evidenced by increased ability to identify progress in self. Patient  denies SI/HI/self-harm at the end of group.      Suicidal/Homicidal: Nowithout intent/plan  Plan: Pt will continue in PHP while working to  decrease depression and anxiety symptoms, increase ability to manage symptoms in a healthy manner, and increase prosocial activities.    Diagnosis: Severe episode of recurrent major depressive disorder, without psychotic features (HCC) [F33.2]    1. Severe episode of recurrent major depressive disorder, without psychotic features (HCC)   2. Generalized anxiety disorder   3. Male-to-male transgender person   4. Autistic spectrum disorder       Michael [name], [facility] 03/28/2020

## 2020-04-19 NOTE — Progress Notes (Signed)
Virtual Visit via Video Note  I connected with Michael [name] on @TODAY @ at 0800 by a video enabled telemedicine application and verified that I am speaking with the correct person using two identifiers.   I discussed the limitations of evaluation and management by telemedicine and the availability of in person appointments. The patient expressed understanding and agreed to proceed.  I discussed the assessment and treatment plan with the patient. The patient was provided an opportunity to ask questions and all were answered. The patient agreed with the plan and demonstrated an understanding of the instructions.   The patient was advised to call back or seek an in-person evaluation if the symptoms worsen or if the condition fails to improve as anticipated.  I provided 20 minutes of non-face-to-face time during this encounter.   Patient ID: Michael [name], adult   DOB: [birth year], 29 y.o.   MRN: [id number] As per previous [facility] note states: Pt reports to PHP per inpt. Pt was inpt due to SI and increased depression. Pt denies current SI/HI- pt reports decreased AVH since started Abilify: pt reports she was hearing voices tell her she is a stalker, a piece of garbage, and would better off not alive (This cln believes it is most likely internal, negative voice, not psychosis). Pt reports seeing "[name] at [facility] psychiatry. Pt reports she was seeing a counselor but insurance wouldn't continue to pay for it so does not have current counselor. Pt denies previous attempts and hospitalizations; pt reports recent self-harm act of punching self "a few weeks ago."No current thoughts of SH. Pt reports following stressors: 1) Decreased support: Pt reports she lost her "community"recently when they called her a stalker and then blocked her on all forms of communication. Pt reports she is unsure why this happened. 2) Diagnoses: Pt is diagnosed with dyslexia and dysgraphia, Autism, ADHD, and depression. 3)  School: Pt is scheduled to graduate with bachelors degree in Criminology soon. Pt is trying to get into grad school. 4) Gender Identity: Pt is transgender MTF. Pt reports insurance will not cover all medications needed for treatment. Patient's Currently Reported Symptoms/Problems: Increased depression; feelings of hopelessness/worthlessness; social anxiety; decreased ADLs- hygiene (increased but not baseline since d/c from inpt) and cleaning; racing thoughts; irritability; low energy; anhedonia; pt reports hallucinations of voices tell her she is worthless, a stalker.cln believes this is internal dialogue with negative self talk  Pt transitioned from PHP to MH-IOP today.  States she has enjoyed attending PHP.  "I got a lot more from listening than I thought I would."  Reports improved sleep.  Denies SI/HI or A/V hallucinations.  Patient completed MH-IOP today.  She reports feeling better (ie. Decreased A/hallucinations, practicing more thought stopping).  Denies SI/HI or V/hallucinations. On a scale 1-10 (10 being the worst), pt rated anxiety a 5 and depression a 6.  "Groups were helpful.  I was able to hear and relate to other's struggles." Support network includes her friends and family. A:  D/C today.  F/U with psychiatrist ([facility]) on 05-10-20 @ 2:30 pm and 12-05-1986 (therapist) @ [facility] to contact patient re: a new patient appt.  Encouraged pt to contact [name] [phone]) if she has heard from the office in a day or so.  Strongly recommended support groups.  R:  Pt receptive.   ([phone], M.Ed,CNA

## 2020-05-03 ENCOUNTER — Ambulatory Visit (HOSPITAL_COMMUNITY): Payer: BC Managed Care – PPO | Admitting: Psychiatry

## 2020-05-03 ENCOUNTER — Telehealth (HOSPITAL_COMMUNITY): Payer: BC Managed Care – PPO | Admitting: Psychiatry

## 2020-05-07 ENCOUNTER — Other Ambulatory Visit: Payer: Self-pay | Admitting: Family

## 2020-05-07 NOTE — Telephone Encounter (Signed)
Abilify 10 mg daily # 30 with 1 RF's.RX for above e-scribed and sent to pharmacy on record  [phone], [place] - [address] [place] [address] Phone: [phone] Fax: [phone]

## 2020-05-10 ENCOUNTER — Other Ambulatory Visit: Payer: Self-pay | Admitting: Family

## 2020-05-10 NOTE — Telephone Encounter (Signed)
Abilify 10 mg daily, # 90 day requested by insurance with 1 RF's.RX for above e-scribed and sent to pharmacy on record  [facility] [phone] [facility], [place] - [address] [place] [address] Phone: [phone] Fax: [phone]

## 2020-05-11 ENCOUNTER — Telehealth (INDEPENDENT_AMBULATORY_CARE_PROVIDER_SITE_OTHER): Payer: BC Managed Care – PPO | Admitting: Family

## 2020-05-11 ENCOUNTER — Encounter: Payer: Self-pay | Admitting: Family

## 2020-05-11 DIAGNOSIS — Z7189 Other specified counseling: Secondary | ICD-10-CM

## 2020-05-11 DIAGNOSIS — Z719 Counseling, unspecified: Secondary | ICD-10-CM

## 2020-05-11 DIAGNOSIS — Z79899 Other long term (current) drug therapy: Secondary | ICD-10-CM

## 2020-05-11 DIAGNOSIS — F819 Developmental disorder of scholastic skills, unspecified: Secondary | ICD-10-CM

## 2020-05-11 DIAGNOSIS — F902 Attention-deficit hyperactivity disorder, combined type: Secondary | ICD-10-CM | POA: Diagnosis not present

## 2020-05-11 DIAGNOSIS — F840 Autistic disorder: Secondary | ICD-10-CM

## 2020-05-11 DIAGNOSIS — F411 Generalized anxiety disorder: Secondary | ICD-10-CM | POA: Diagnosis not present

## 2020-05-11 DIAGNOSIS — F640 Transsexualism: Secondary | ICD-10-CM | POA: Diagnosis not present

## 2020-05-11 DIAGNOSIS — R278 Other lack of coordination: Secondary | ICD-10-CM

## 2020-05-11 DIAGNOSIS — Z789 Other specified health status: Secondary | ICD-10-CM

## 2020-05-11 DIAGNOSIS — F332 Major depressive disorder, recurrent severe without psychotic features: Secondary | ICD-10-CM

## 2020-05-11 MED ORDER — AMPHETAMINE SULFATE 10 MG PO TABS: 10.0000 mg | ORAL_TABLET | Freq: Every day | ORAL | 0 refills | Status: DC

## 2020-05-11 MED ORDER — SERTRALINE HCL 100 MG PO TABS: 150.0000 mg | ORAL_TABLET | Freq: Every day | ORAL | 1 refills | Status: DC

## 2020-05-11 NOTE — Progress Notes (Signed)
[facility] [address] [place] [place] [address] Dept: [phone] Dept Fax: [phone]  Medication Check visit via Virtual Video due to COVID-19  Patient ID:  Michael [name]  male DOB: 01/25/1991   29 y.o.   MRN: [id number]   DATE:05/11/20  PCP: Georgann Housekeeper, MD  Virtual Visit via Video Note  I connected with  Michael [name] on 05/11/20 at 11:00 AM EDT by a video enabled telemedicine application and verified that I am speaking with the correct person using two identifiers. Patient/Parent Location: at home   I discussed the limitations, risks, security and privacy concerns of performing an evaluation and management service by telephone and the availability of in person appointments. I also discussed with the parents that there may be a patient responsible charge related to this service. The parents expressed understanding and agreed to proceed.  Provider: Carron Curie, NP  Location: at work  HISTORY/CURRENT STATUS: Michael [name] is here for medication management of the psychoactive medications for ADHD and review of educational and behavioral concerns.   Michael [name] currently taking medication, which is working well. Takes medication as directed. Medication tends to wear off around early evening for the [facility]. Michael [name] is able to focus through school/homework.   Michael [name] is eating well (eating breakfast, lunch and dinner). Eating with no recent changes.   Sleeping well (getting plenty of sleep), sleeping through the night.    EDUCATION: School: [employer]  Performance/ Grades: average Services: Other: Disability Services May graduate from undergraduate and apply to graduate school for the Spring session.   Activities/ Exercise: intermittently  Screen time: (phone, tablet, TV, computer): computer for learning, TV, games and phone.   MEDICAL  HISTORY: Individual Medical History/ Review of Systems: Changes? :Yes, recently seen Endocrinology for hormone replacement.   Family Medical/ Social History: Changes? No Patient Lives with: parents  Current Medications:  Current Outpatient Medications:  .  Amphetamine Sulfate (EVEKEO) 10 MG TABS, Take 10 mg by mouth daily., Disp: 30 tablet, Rfl: 0 .  ARIPiprazole (ABILIFY) 10 MG tablet, TAKE 1 TABLET BY MOUTH EVERY DAY, Disp: 90 tablet, Rfl: 1 .  busPIRone (BUSPAR) 30 MG tablet, Take 1 tablet (30 mg total) by mouth 2 (two) times daily., Disp: 60 tablet, Rfl: 1 .  Elagolix Sodium 150 MG TABS, Take 150 mg by mouth daily., Disp: 30 tablet, Rfl: 11 .  estradiol (ESTRACE) 2 MG tablet, Take 1 tablet (2 mg total) by mouth daily., Disp: 90 tablet, Rfl: 3 .  norethindrone (MICRONOR) 0.35 MG tablet, TAKE 1 TABLET BY MOUTH EVERY DAY, Disp: 84 tablet, Rfl: 2 .  sertraline (ZOLOFT) 100 MG tablet, Take 1.5 tablets (150 mg total) by mouth daily., Disp: 45 tablet, Rfl: 1  Medication Side Effects: None  MENTAL HEALTH: Mental Health Issues:   Depression and Anxiety-Zoloft and Abilify     DIAGNOSES:    ICD-10-CM   1. ADHD (attention deficit hyperactivity disorder), combined type  F90.2   2. Generalized anxiety disorder  F41.1   3. Dysgraphia  R27.8   4. Male-to-male transgender person  F64.0   5. Severe episode of recurrent major depressive disorder, without psychotic features (HCC)  F33.2   6. Autistic spectrum disorder  F84.0   7. Learning difficulty  F81.9   8. Medication management  Z79.899   9. Patient counseled  Z71.9   10. Goals of care, counseling/discussion  Z71.89    RECOMMENDATIONS:  Discussed recent history with  patient with updates on school, graduation, graduate school, learning, health, recent f/u appts, and medications.  Advocated for patient to continue with counseling services. To transition to new counselor, Devonne Doughty.   Discussed school academic progress and recommended  continued accommodations needed for continued learning support.  Discussed growth and development and current weight. Recommended healthy food choices, watching portion sizes, avoiding second helpings, avoiding sugary drinks like soda and tea, drinking more water, getting more exercise.   Discussed continued need for structure, routine, reward (external), motivation (internal), positive reinforcement, consequences, and organization for school and home.   Encouraged recommended limitations on TV, tablets, phones, video games and computers for non-educational activities.   Discussed need for bedtime routine, use of good sleep hygiene, no video games, TV or phones for an hour before bedtime.   Encouraged physical activity and outdoor activity, maintaining social distancing.   Counseled medication pharmacokinetics, options, dosage, administration, desired effects, and possible side effects.   Zoloft 100 mg 1 1/2 tablets daily, # 45 with 1 RF's Evekeo 10 mg daily, # 30 with no RF's Buspar 30 mg BID, no Rx today Abilify 10 mg, no Rx today RX for above e-scribed and sent to pharmacy on record  [id number] [name], [place] - [address] [place] [address] Phone: [phone] Fax: [phone]  I discussed the assessment and treatment plan with the patient. The patient was provided an opportunity to ask questions and all were answered. The patient agreed with the plan and demonstrated an understanding of the instructions.   I provided 20 minutes of non-face-to-face time during this encounter.  Completed record review for 10 minutes prior to the virtual video visit.   NEXT APPOINTMENT:  Return in about 3 months (around 08/11/2020) for f/u visit.  The patient was advised to call back or seek an in-person evaluation if the symptoms worsen or if the condition fails to improve as anticipated.  Medical Decision-making: More than 50% of the appointment was  spent counseling and discussing diagnosis and management of symptoms with the patient and family.  Carron Curie, NP

## 2020-05-14 ENCOUNTER — Other Ambulatory Visit: Payer: Self-pay

## 2020-05-14 MED ORDER — SERTRALINE HCL 100 MG PO TABS: 150.0000 mg | ORAL_TABLET | Freq: Every day | ORAL | 1 refills | Status: DC

## 2020-05-14 NOTE — Telephone Encounter (Signed)
Resent Rx for Zoloft 100 mg 1 1/2 tablets daily, #45 with 1 RF's.RX for above e-scribed and sent to pharmacy on record  [phone], [place] - [address] [place] [address] Phone: [phone] Fax: [phone]

## 2020-05-14 NOTE — Addendum Note (Signed)
Addended by: Cleatis Fandrich A on: 05/14/2020 02:09 PM   Modules accepted: Orders

## 2020-05-27 ENCOUNTER — Other Ambulatory Visit: Payer: Self-pay | Admitting: Pediatrics

## 2020-05-28 NOTE — Telephone Encounter (Signed)
Last seen 04/2020.  E-Prescribed BuSpar 30 directly to  [id number] [name], [place] - [address] [place] [address] Phone: [phone] Fax: [phone]

## 2020-05-29 ENCOUNTER — Telehealth: Payer: Self-pay | Admitting: Endocrinology

## 2020-05-29 MED ORDER — FINASTERIDE 5 MG PO TABS: 5.0000 mg | ORAL_TABLET | Freq: Every day | ORAL | 3 refills | Status: DC

## 2020-05-29 NOTE — Telephone Encounter (Signed)
Patient called stating she needs a "T-blocker":  Pharmacy:  [id number] [name], [place] - [address] Phone:  [phone]  Fax:  [phone]

## 2020-05-29 NOTE — Telephone Encounter (Signed)
Ok, I have sent a prescription to your pharmacy 

## 2020-05-29 NOTE — Telephone Encounter (Signed)
Please review pt request and advise 

## 2020-05-29 NOTE — Telephone Encounter (Signed)
Outpatient Medication Detail   Disp Refills Start End   finasteride (PROSCAR) 5 MG tablet 90 tablet 3 05/29/2020    Sig - Route: Take 1 tablet (5 mg total) by mouth daily. - Oral   Sent to pharmacy as: finasteride (PROSCAR) 5 MG tablet   E-Prescribing Status: Receipt confirmed by pharmacy (05/29/2020  3:43 PM EDT)

## 2020-06-11 ENCOUNTER — Other Ambulatory Visit: Payer: Self-pay | Admitting: Family

## 2020-06-11 MED ORDER — AMPHETAMINE SULFATE 10 MG PO TABS: 10.0000 mg | ORAL_TABLET | Freq: Every day | ORAL | 0 refills | Status: DC

## 2020-06-11 NOTE — Telephone Encounter (Signed)
RX for above e-scribed and sent to pharmacy on record  [id number] [place], [place] - [address] [place] [address] Phone: [phone] Fax: [phone] 

## 2020-06-11 NOTE — Telephone Encounter (Signed)
Patient called for refill for Evekeo.  Patient last seen 05/11/20. Pharmacy not specified.

## 2020-06-28 ENCOUNTER — Telehealth: Payer: Self-pay | Admitting: Endocrinology

## 2020-06-28 ENCOUNTER — Other Ambulatory Visit: Payer: Self-pay

## 2020-06-28 DIAGNOSIS — Z789 Other specified health status: Secondary | ICD-10-CM

## 2020-06-28 MED ORDER — ESTRADIOL 2 MG PO TABS: 2.0000 mg | ORAL_TABLET | Freq: Every day | ORAL | 3 refills | Status: DC

## 2020-06-28 NOTE — Telephone Encounter (Signed)
Medication Refill Request  Did you call your pharmacy and request this refill first? Yes-need new RX  If patient has not contacted pharmacy first, instruct them to do so for future refills.   Remind them that contacting the pharmacy for their refill is the quickest method to get the refill.   Refill policy also stated that it will take anywhere between 24-72 hours to receive the refill.    Name of medication?  estradiol (ESTRACE) 2 MG tablet  Is this a 90 day supply? Yes  [facility] and [facility]  [id number] [name], [place] - [address] Phone:  [phone]  Fax:  [phone]

## 2020-06-28 NOTE — Telephone Encounter (Signed)
Outpatient Medication Detail   Disp Refills Start End   estradiol (ESTRACE) 2 MG tablet 90 tablet 3 03/02/2020    Sig - Route: Take 1 tablet (2 mg total) by mouth daily. - Oral   Sent to pharmacy as: estradiol (ESTRACE) 2 MG tablet   E-Prescribing Status: Receipt confirmed by pharmacy (03/02/2020  7:13 AM EDT)    Above reflects has ample supply of refills. Rx has again been re-sent with notes to pharmacist that indicates SECOND REQUEST.  Outpatient Medication Detail   Disp Refills Start End   estradiol (ESTRACE) 2 MG tablet 90 tablet 3 06/28/2020    Sig - Route: Take 1 tablet (2 mg total) by mouth daily. - Oral   Sent to pharmacy as: estradiol (ESTRACE) 2 MG tablet   Notes to Pharmacy: SECOND REQUEST   E-Prescribing Status: Receipt confirmed by pharmacy (06/28/2020  9:27 AM EDT)

## 2020-07-27 ENCOUNTER — Telehealth: Payer: Self-pay | Admitting: Family

## 2020-07-27 NOTE — Telephone Encounter (Signed)
Refused refill for Zoloft due to RF's on last RX sent on August.

## 2020-08-24 ENCOUNTER — Other Ambulatory Visit: Payer: Self-pay

## 2020-08-24 ENCOUNTER — Encounter: Payer: Self-pay | Admitting: Endocrinology

## 2020-08-24 ENCOUNTER — Ambulatory Visit (INDEPENDENT_AMBULATORY_CARE_PROVIDER_SITE_OTHER): Payer: Medicaid Other | Admitting: Endocrinology

## 2020-08-24 VITALS — BP 138/82 | HR 84 | Ht 65.0 in | Wt 149.6 lb

## 2020-08-24 DIAGNOSIS — Z789 Other specified health status: Secondary | ICD-10-CM

## 2020-08-24 LAB — BASIC METABOLIC PANEL
BUN: 15 mg/dL (ref 6–23)
CO2: 27 mEq/L (ref 19–32)
Calcium: 9.4 mg/dL (ref 8.4–10.5)
Chloride: 105 mEq/L (ref 96–112)
Creatinine, Ser: 0.72 mg/dL (ref 0.40–1.50)
GFR: 125.4 mL/min (ref >60.00)
Glucose, Bld: 92 mg/dL (ref 70–99)
Potassium: 4 mEq/L (ref 3.5–5.1)
Sodium: 139 mEq/L (ref 135–145)

## 2020-08-24 NOTE — Progress Notes (Signed)
Subjective:    Patient ID: Michael [name], adult    DOB: [birth year], 29 y.o.   MRN: [id number]  HPI Pt returns for f/u of transgender state (M to F).   Surgery: never, but he is considering with [facility] Medication: E2 (since 2020), finasteride, and norethindrone (since 2021).   Counseling: goes to [facility] Other: She has no risk factors for E2 rx.   SDOH: pt's father has questioned transgender dx; ins declined elagolix Interval hx: pt says since on 2 meds, she reports ongoing breast growth.  She feels as though this helps dysphoria.   Past Medical History:  Diagnosis Date  . ADHD (attention deficit hyperactivity disorder)   . Anxiety   . Autism     No past surgical history on file.  Social History   Socioeconomic History  . Marital status: Single    Spouse name: Not on file  . Number of children: Not on file  . Years of education: Not on file  . Highest education level: Not on file  Occupational History  . Not on file  Tobacco Use  . Smoking status: Never Smoker  . Smokeless tobacco: Never Used  Substance and Sexual Activity  . Alcohol use: Not on file  . Drug use: Not on file  . Sexual activity: Not on file  Other Topics Concern  . Not on file  Social History Narrative  . Not on file   Social Determinants of Health   Financial Resource Strain:   . Difficulty of Paying Living Expenses: Not on file  Food Insecurity:   . Worried About Programme researcher, broadcasting/film/video in the Last Year: Not on file  . Ran Out of Food in the Last Year: Not on file  Transportation Needs:   . Lack of Transportation (Medical): Not on file  . Lack of Transportation (Non-Medical): Not on file  Physical Activity:   . Days of Exercise per Week: Not on file  . Minutes of Exercise per Session: Not on file  Stress:   . Feeling of Stress : Not on file  Social Connections:   . Frequency of Communication with Friends and Family: Not on file  . Frequency of Social Gatherings with Friends and Family: Not  on file  . Attends Religious Services: Not on file  . Active Member of Clubs or Organizations: Not on file  . Attends Banker Meetings: Not on file  . Marital Status: Not on file  Intimate Partner Violence:   . Fear of Current or Ex-Partner: Not on file  . Emotionally Abused: Not on file  . Physically Abused: Not on file  . Sexually Abused: Not on file    Current Outpatient Medications on File Prior to Visit  Medication Sig Dispense Refill  . Amphetamine Sulfate (EVEKEO) 10 MG TABS Take 10 mg by mouth daily. 30 tablet 0  . ARIPiprazole (ABILIFY) 10 MG tablet TAKE 1 TABLET BY MOUTH EVERY DAY 90 tablet 1  . busPIRone (BUSPAR) 30 MG tablet TAKE 1 TABLET BY MOUTH 2 TIMES DAILY 180 tablet 0  . estradiol (ESTRACE) 2 MG tablet Take 1 tablet (2 mg total) by mouth daily. 90 tablet 3  . finasteride (PROSCAR) 5 MG tablet Take 1 tablet (5 mg total) by mouth daily. 90 tablet 3  . norethindrone (MICRONOR) 0.35 MG tablet TAKE 1 TABLET BY MOUTH EVERY DAY 84 tablet 2  . sertraline (ZOLOFT) 100 MG tablet Take 1.5 tablets (150 mg total) by mouth daily. 45 tablet  1   No current facility-administered medications on file prior to visit.    Allergies  Allergen Reactions  . Shellfish Allergy     Family History  Problem Relation Age of Onset  . Asthma Mother   . Depression Mother   . Learning disabilities Mother   . ADD / ADHD Father   . Other Father        low testosterone  . Speech disorder Brother     BP 138/82   Pulse 84   Ht 5\' 5"  (1.651 m)   Wt 149 lb 9.6 oz (67.9 kg)   SpO2 95%   BMI 24.89 kg/m    Review of Systems Denies sob    Objective:   Physical Exam VITAL SIGNS:  See vs page GENERAL: no distress EXT: no leg edema.       Assessment & Plan:  Transgender state, due for recheck of labs.    Patient Instructions  Blood tests are requested for you today.  We'll let you know about the results.   Based on the results, we'll increase the estrogen, and add  spironolactone if we can.  Please come back for a follow-up appointment in 6 months.

## 2020-08-24 NOTE — Patient Instructions (Addendum)
Blood tests are requested for you today.  We'll let you know about the results.   Based on the results, we'll increase the estrogen, and add spironolactone if we can.  Please come back for a follow-up appointment in 6 months.

## 2020-08-26 LAB — TESTOSTERONE,FREE AND TOTAL
Testosterone, Free: 3.2 pg/mL — ABNORMAL LOW (ref 9.3–26.5)
Testosterone: 45 ng/dL — ABNORMAL LOW (ref 264–916)

## 2020-08-30 LAB — ESTRADIOL, FREE
Estradiol, Free: 1.29 pg/mL — ABNORMAL HIGH
Estradiol: 47 pg/mL — ABNORMAL HIGH

## 2020-08-31 ENCOUNTER — Other Ambulatory Visit: Payer: Self-pay | Admitting: Endocrinology

## 2020-08-31 DIAGNOSIS — Z789 Other specified health status: Secondary | ICD-10-CM

## 2020-08-31 MED ORDER — ESTRADIOL 2 MG PO TABS: 4.0000 mg | ORAL_TABLET | Freq: Every day | ORAL | 3 refills | Status: DC

## 2020-09-03 ENCOUNTER — Telehealth: Payer: Self-pay

## 2020-09-03 MED ORDER — SPIRONOLACTONE 25 MG PO TABS: 25.0000 mg | ORAL_TABLET | Freq: Every day | ORAL | 3 refills | Status: DC

## 2020-09-03 NOTE — Telephone Encounter (Signed)
OK, I have sent a prescription to your pharmacy.  

## 2020-09-03 NOTE — Telephone Encounter (Signed)
Patient aware.

## 2020-09-03 NOTE — Telephone Encounter (Signed)
Patient informed of lab results.  Patient asked if she should be on spirolactone?

## 2020-09-03 NOTE — Telephone Encounter (Signed)
-----   Message from Romero Belling, MD sent at 08/31/2020  7:14 PM EST ----- please contact patient: Improved, but we are not there yet.  I have sent a prescription to your pharmacy, to increase the estrogen.  I'll see you next time.

## 2020-09-10 ENCOUNTER — Other Ambulatory Visit: Payer: Self-pay

## 2020-09-10 MED ORDER — AMPHETAMINE SULFATE 10 MG PO TABS: 10.0000 mg | ORAL_TABLET | Freq: Every day | ORAL | 0 refills | Status: DC

## 2020-09-10 NOTE — Telephone Encounter (Signed)
Evekeo 10 mg daily, #30 with no RF's. .RX for above e-scribed and sent to pharmacy on record  [id number] [place], [place] - [address] [place] [address] Phone: [phone] Fax: [phone]   

## 2020-09-10 NOTE — Telephone Encounter (Signed)
Patient called in for refill for Evekeo. Last visit 05/11/2020 next visit 09/14/2020. [facility] [address]

## 2020-09-14 ENCOUNTER — Telehealth: Payer: Self-pay | Admitting: Family

## 2020-09-29 ENCOUNTER — Other Ambulatory Visit: Payer: Self-pay | Admitting: Pediatrics

## 2020-10-01 NOTE — Telephone Encounter (Signed)
Last visit 05/11/2020 next visit 01/10/2021 

## 2020-10-01 NOTE — Telephone Encounter (Signed)
E-Prescribed buspirone 30 directly to  [id number] [name], [place] - [address] [place] [address] Phone: [phone] Fax: [phone]

## 2020-11-06 ENCOUNTER — Other Ambulatory Visit: Payer: Self-pay

## 2020-11-06 MED ORDER — AMPHETAMINE SULFATE 10 MG PO TABS: 10.0000 mg | ORAL_TABLET | Freq: Every day | ORAL | 0 refills | Status: DC

## 2020-11-06 NOTE — Telephone Encounter (Signed)
Evekeo 10 mg daily, #30 with no RF's. .RX for above e-scribed and sent to pharmacy on record  [id number] [place], [place] - [address] [place] [address] Phone: [phone] Fax: [phone]   

## 2020-11-06 NOTE — Telephone Encounter (Signed)
Last visit 05/11/2020 next visit 01/10/2021 

## 2020-11-07 ENCOUNTER — Other Ambulatory Visit: Payer: Self-pay | Admitting: Pediatrics

## 2020-11-07 NOTE — Telephone Encounter (Signed)
E-Prescribed sertraline directly to  [id number] [name],  - [address] [place] [address] Phone: [phone] Fax: [phone]

## 2020-11-07 NOTE — Telephone Encounter (Signed)
Last visit 05/11/2020 next visit 01/10/2021

## 2020-11-29 ENCOUNTER — Other Ambulatory Visit: Payer: Self-pay | Admitting: Family

## 2020-11-30 NOTE — Telephone Encounter (Signed)
Abilify 10 mg daily, # 84 with 2 RF's according to insurance quantities. RX for above e-scribed and sent to pharmacy on record  [facility] [phone] [facility], [place] - [address] [place] [address] Phone: [phone] Fax: [phone]

## 2020-12-10 ENCOUNTER — Other Ambulatory Visit: Payer: Self-pay | Admitting: Endocrinology

## 2020-12-11 NOTE — Telephone Encounter (Signed)
Please advise. Ok to refill? °

## 2021-01-05 ENCOUNTER — Other Ambulatory Visit: Payer: Self-pay | Admitting: Pediatrics

## 2021-01-07 MED ORDER — AMPHETAMINE SULFATE 10 MG PO TABS: 10.0000 mg | ORAL_TABLET | Freq: Every day | ORAL | 0 refills | Status: DC

## 2021-01-07 NOTE — Telephone Encounter (Signed)
Last visit 05/11/2020 next visit 01/10/2021

## 2021-01-07 NOTE — Telephone Encounter (Signed)
E-Prescribed Evekeo 10 and Buspirone 30 directly to  [id number] [name], [place] - [address] [place] [address] Phone: [phone] Fax: [phone]

## 2021-01-10 ENCOUNTER — Encounter: Payer: Self-pay | Admitting: Family

## 2021-01-10 ENCOUNTER — Other Ambulatory Visit: Payer: Self-pay

## 2021-01-10 ENCOUNTER — Ambulatory Visit (INDEPENDENT_AMBULATORY_CARE_PROVIDER_SITE_OTHER): Payer: 59 | Admitting: Family

## 2021-01-10 VITALS — BP 122/64 | HR 72 | Resp 16 | Ht 65.75 in | Wt 148.0 lb

## 2021-01-10 DIAGNOSIS — F902 Attention-deficit hyperactivity disorder, combined type: Secondary | ICD-10-CM | POA: Diagnosis not present

## 2021-01-10 DIAGNOSIS — Z789 Other specified health status: Secondary | ICD-10-CM

## 2021-01-10 DIAGNOSIS — Z79899 Other long term (current) drug therapy: Secondary | ICD-10-CM

## 2021-01-10 DIAGNOSIS — Z7189 Other specified counseling: Secondary | ICD-10-CM

## 2021-01-10 DIAGNOSIS — F411 Generalized anxiety disorder: Secondary | ICD-10-CM | POA: Diagnosis not present

## 2021-01-10 DIAGNOSIS — R278 Other lack of coordination: Secondary | ICD-10-CM

## 2021-01-10 DIAGNOSIS — Z719 Counseling, unspecified: Secondary | ICD-10-CM

## 2021-01-10 DIAGNOSIS — F333 Major depressive disorder, recurrent, severe with psychotic symptoms: Secondary | ICD-10-CM

## 2021-01-10 DIAGNOSIS — F840 Autistic disorder: Secondary | ICD-10-CM

## 2021-01-10 MED ORDER — SERTRALINE HCL 100 MG PO TABS: ORAL_TABLET | ORAL | 2 refills | Status: DC

## 2021-01-10 NOTE — Progress Notes (Signed)
[facility] [place] [facility] [address] [place] [address] Dept: [phone] Dept Fax: [phone] Loc: [phone] Loc Fax: [phone]  Medication Check  Patient ID: Michael [name], adult  DOB: April 04, 1991, 30 y.o.  MRN: [id number]  Date of Evaluation: 01/10/2021 PCP: Georgann Housekeeper, MD  Accompanied by: self Patient Lives with: parents  HISTORY/CURRENT STATUS: HPI Patient here by herself for the visit today. Patient appropriate with interactions and answering question. Not currently at school and took semester off to start graduate school in the fall. 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 No changes with eating, sleeping or family since last f/u visit. Is continuing to get therapy weekly and no changes with hormones since last visit. Doing well with Zoloft, Evekeo, Abilify, and Buspar prn with no reported side effects.   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   MEDICAL HISTORY: Appetite: No changes with variety of foods MVI/Other: Fe for supplements  Sleep: Bedtime: 11:00 pm  Awakens: 8:00 am  Concerns: Initiation/Maintenance/Other: no issues.   Individual Medical History/ Review of Systems: Changes? :Yes Dr. Sherrie Mustache, oral surgery at [facility], for facial feminization for facial reconstruction. Waiting on insurance for approval or denial, looking at cost. Dr. Everardo All for f/u appointment for hormones in May.   Allergies: Shellfish allergy  Current Medications: Current Outpatient Medications  Medication Instructions  . Amphetamine Sulfate (EVEKEO) 10 mg, Oral, Daily  . ARIPiprazole (ABILIFY) 10 MG tablet TAKE 1 TABLET BY MOUTH EVERY DAY  . busPIRone (BUSPAR) 30 MG tablet TAKE 1 TABLET BY  MOUTH TWICE A DAY  . estradiol (ESTRACE) 4 mg, Oral, Daily  . finasteride (PROSCAR) 5 mg, Oral, Daily  . norethindrone (MICRONOR) 0.35 MG tablet TAKE 1 TABLET BY MOUTH EVERY DAY  . sertraline (ZOLOFT) 100 MG tablet TAKE 1.5 TABLETS BY MOUTH DAILY  . spironolactone (ALDACTONE) 25 mg, Oral, Daily   Medication Side Effects: None  Family Medical/ Social History: Changes? None  MENTAL HEALTH: Mental Health Issues: Anxiety and Depression with symptom control with Zoloft and Abilify.   PHYSICAL EXAM; Vitals:  Vitals:   01/10/21 1532  BP: 122/64  Pulse: 72  Resp: 16  Height: 5' 5.75" (1.67 m)  Weight: 148 lb (67.1 kg)  BMI (Calculated): 24.07   General Physical Exam: Unchanged from previous exam, date: none reported Changed:none  DIAGNOSES:    ICD-10-CM   1. ADHD (attention deficit hyperactivity disorder), combined type  F90.2   2. Generalized anxiety disorder  F41.1   3. Dysgraphia  R27.8   4. Male-to-male transgender person  Z59.9   5. MDD (major depressive disorder), recurrent, severe, with psychosis (HCC)  F33.3   6. Autistic spectrum disorder  F84.0   7. Medication management  Z79.899   8. Patient counseled  Z71.9   9. Goals of care, counseling/discussion  Z71.89    ASSESSMENT: Patient graduated her undergraduate program and is taking a semester off until fall semester starts for graduate school. Formal services in place for accommodations at the college through the [employer] office for learning, dysgraphia, ASD, ADHD and Anxiety Disorder.  Extra help and tutoring are also available on campus for learning needs and academic support. Not currently working and preparing for graduate school over the summer. Has continue with hormonal supplementation for transitioning through Dr. Everardo All, endocrinology, and next f/u  scheduled for May. No other concerns with health changes, family, eating or sleeping since last visit. Patinet is still continuing to have weekly counseling sessions to  assist with coping mechanisms and emotional regulation. Current symptoms of Anxiety are well controlled with Zoloft daily and occasional use of Buspar as needed. Abilify has had good efficacy for his depression and mood along with Evekeo daily for his ADHD with no side effects. Patient reports no need for changes and symptoms are improving with current regimen and doses.   RECOMMENDATIONS:  Patient provided updates with school, graduate programs, [employer], health, family and medications.  Discussed continuation of services at [employer] for graduate school with [employer] providing accommodations/modifications for his anxiety, depression, ADHD, ASD, dysgraphia, and learning difficulties.   Updates provided for recent changes with hormone changes to "cocktail' for transitioning. Regular f/u with Dr. Everardo All and next f/u scheduled for May.  [name] has continued with regular counseling for emotional dysregulation and coping mechanisms. This has been helpful with dealing with parental difficulties adjusting to gender dysphoria.  Reviewed recent consultation with Dr. Sherrie Mustache for facial reconstruction for feminization. Waiting on insurance approval or denial to proceed with surgery and when. Support given to patient with information reviewed from consultation visit.   Encouraged daily activity with walking the dog for ongoing exercise. Encouraged eating a healthy diet and drinking plenty of fluids for healthy lifestyle.   Counseled medication pharmacokinetics, options, dosage, administration, desired effects, and possible side effects.   Evekeo 10 mg daily, no Rx today Buspar 30 mg BID, no Rx today Zoloft 100 mg 1 1/2 daily, #45 with 2 RF's Abilify 10 mg # 84 with 2 RF's RX for above e-scribed and sent to pharmacy on record  [id number] [name], [place] - [address] [place] [address] Phone: [phone] Fax: [phone]  I discussed the assessment and treatment plan with  the patientt. The patient was provided an opportunity to ask questions and all were answered. The patient agreed with the plan and demonstrated an understanding of the instructions.   NEXT APPOINTMENT: Return in about 3 months (around 04/11/2021) for f/u visit.  Carron Curie, NP Counseling Time: 32 mins Total Contact Time: 40 mins

## 2021-02-22 ENCOUNTER — Other Ambulatory Visit: Payer: Self-pay

## 2021-02-22 ENCOUNTER — Ambulatory Visit (INDEPENDENT_AMBULATORY_CARE_PROVIDER_SITE_OTHER): Payer: 59 | Admitting: Endocrinology

## 2021-02-22 VITALS — BP 124/60 | HR 103 | Ht 65.75 in | Wt 150.8 lb

## 2021-02-22 DIAGNOSIS — Z789 Other specified health status: Secondary | ICD-10-CM | POA: Diagnosis not present

## 2021-02-22 NOTE — Patient Instructions (Addendum)
Blood tests are requested for you today.  We'll let you know about the results.   Based on the results, we'll increase the estrogen and the spironolactone if we can.   Please come back for a follow-up appointment in 6 months.

## 2021-02-22 NOTE — Progress Notes (Signed)
Subjective:    Patient ID: Michael [name], adult    DOB: 02-Apr-1991, 30 y.o.   MRN: [id number]  HPI Pt returns for f/u of transgender state (M to F).   Surgery: never, but he is considering with [facility] Medication: E2 (since 2020), finasteride, spironolactone, and norethindrone (since 2021).   Counseling: goes to [facility] Other: She has no risk factors for E2 rx.   SDOH: pt's father has questioned transgender dx; ins declined elagolix Interval hx: pt says since on increased dosages, she reports ongoing breast growth.  She feels as though this also helps dysphoria.  She has not recently missed meds. She has seen [facility], and plan is surgery.  Past Medical History:  Diagnosis Date  . ADHD (attention deficit hyperactivity disorder)   . Anxiety   . Autism     No past surgical history on file.  Social History   Socioeconomic History  . Marital status: Single    Spouse name: Not on file  . Number of children: Not on file  . Years of education: Not on file  . Highest education level: Not on file  Occupational History  . Not on file  Tobacco Use  . Smoking status: Never Smoker  . Smokeless tobacco: Never Used  Substance and Sexual Activity  . Alcohol use: Not on file  . Drug use: Not on file  . Sexual activity: Not on file  Other Topics Concern  . Not on file  Social History Narrative  . Not on file   Social Determinants of Health   Financial Resource Strain: Not on file  Food Insecurity: Not on file  Transportation Needs: Not on file  Physical Activity: Not on file  Stress: Not on file  Social Connections: Not on file  Intimate Partner Violence: Not on file    Current Outpatient Medications on File Prior to Visit  Medication Sig Dispense Refill  . Amphetamine Sulfate (EVEKEO) 10 MG TABS Take 10 mg by mouth daily. 30 tablet 0  . ARIPiprazole (ABILIFY) 10 MG tablet TAKE 1 TABLET BY MOUTH EVERY DAY 84 tablet 2  . busPIRone (BUSPAR) 30 MG tablet TAKE 1 TABLET BY MOUTH TWICE A  DAY 180 tablet 0  . estradiol (ESTRACE) 2 MG tablet Take 2 tablets (4 mg total) by mouth daily. 180 tablet 3  . finasteride (PROSCAR) 5 MG tablet Take 1 tablet (5 mg total) by mouth daily. 90 tablet 3  . norethindrone (MICRONOR) 0.35 MG tablet TAKE 1 TABLET BY MOUTH EVERY DAY 84 tablet 2  . sertraline (ZOLOFT) 100 MG tablet TAKE 1.5 TABLETS BY MOUTH DAILY 45 tablet 2   No current facility-administered medications on file prior to visit.    Allergies  Allergen Reactions  . Shellfish Allergy     Family History  Problem Relation Age of Onset  . Asthma Mother   . Depression Mother   . Learning disabilities Mother   . ADD / ADHD Father   . Other Father        low testosterone  . Speech disorder Brother     BP 124/60 (BP Location: Right Arm, Patient Position: Sitting, Cuff Size: Normal)   Pulse (!) 103   Ht 5' 5.75" (1.67 m)   Wt 150 lb 12.8 oz (68.4 kg)   SpO2 96%   BMI 24.53 kg/m    Review of Systems Denies sob.      Objective:   Physical Exam VITAL SIGNS:  See vs page GENERAL: no distress Ext:  no leg edema  Lab Results  Component Value Date   CREATININE 0.68 02/22/2021   BUN 10 02/22/2021   NA 142 02/22/2021   K 4.0 02/22/2021   CL 109 02/22/2021   CO2 26 02/22/2021      Assessment & Plan:  Transgender state: uncontrolled.  I agree with the plan for surgery.  I have sent a prescription to your pharmacy, to increase aldactone.    Patient Instructions  Blood tests are requested for you today.  We'll let you know about the results.   Based on the results, we'll increase the estrogen and the spironolactone if we can.   Please come back for a follow-up appointment in 6 months.

## 2021-02-23 MED ORDER — SPIRONOLACTONE 50 MG PO TABS: 50.0000 mg | ORAL_TABLET | Freq: Every day | ORAL | 3 refills | Status: DC

## 2021-02-25 ENCOUNTER — Other Ambulatory Visit: Payer: Self-pay

## 2021-02-25 LAB — TESTOSTERONE,FREE AND TOTAL
Testosterone, Free: 0.8 pg/mL — ABNORMAL LOW (ref 9.3–26.5)
Testosterone: 11 ng/dL — ABNORMAL LOW (ref 264–916)

## 2021-02-25 MED ORDER — AMPHETAMINE SULFATE 10 MG PO TABS: 10.0000 mg | ORAL_TABLET | Freq: Every day | ORAL | 0 refills | Status: DC

## 2021-02-25 NOTE — Telephone Encounter (Signed)
E-Prescribed Evekeo 10 directly to  [facility] [address] [name], [place] - [address] [place] [address] Phone: [phone] Fax: [phone]

## 2021-02-25 NOTE — Telephone Encounter (Signed)
Last visit 01/10/2021 next visit 04/16/2021

## 2021-02-27 ENCOUNTER — Telehealth: Payer: Self-pay

## 2021-02-28 LAB — TSH: TSH: 1.03 mIU/L (ref 0.40–4.50)

## 2021-02-28 LAB — BASIC METABOLIC PANEL
BUN: 10 mg/dL (ref 7–25)
CO2: 26 mmol/L (ref 20–32)
Calcium: 9.2 mg/dL (ref 8.6–10.3)
Chloride: 109 mmol/L (ref 98–110)
Creat: 0.68 mg/dL (ref 0.60–1.35)
Glucose, Bld: 104 mg/dL — ABNORMAL HIGH (ref 65–99)
Potassium: 4 mmol/L (ref 3.5–5.3)
Sodium: 142 mmol/L (ref 135–146)

## 2021-02-28 LAB — ESTRADIOL, FREE
Estradiol, Free: 1.71 pg/mL — ABNORMAL HIGH
Estradiol: 103 pg/mL — ABNORMAL HIGH (ref <=29)

## 2021-03-01 ENCOUNTER — Other Ambulatory Visit: Payer: Self-pay | Admitting: Endocrinology

## 2021-03-01 DIAGNOSIS — Z789 Other specified health status: Secondary | ICD-10-CM

## 2021-03-01 MED ORDER — ESTRADIOL 2 MG PO TABS: 6.0000 mg | ORAL_TABLET | Freq: Every day | ORAL | 3 refills | Status: DC

## 2021-04-16 ENCOUNTER — Telehealth (INDEPENDENT_AMBULATORY_CARE_PROVIDER_SITE_OTHER): Payer: 59 | Admitting: Family

## 2021-04-16 ENCOUNTER — Other Ambulatory Visit: Payer: Self-pay

## 2021-04-16 ENCOUNTER — Encounter: Payer: Self-pay | Admitting: Family

## 2021-04-16 DIAGNOSIS — Z8659 Personal history of other mental and behavioral disorders: Secondary | ICD-10-CM

## 2021-04-16 DIAGNOSIS — Z79899 Other long term (current) drug therapy: Secondary | ICD-10-CM

## 2021-04-16 DIAGNOSIS — F411 Generalized anxiety disorder: Secondary | ICD-10-CM

## 2021-04-16 DIAGNOSIS — R278 Other lack of coordination: Secondary | ICD-10-CM

## 2021-04-16 DIAGNOSIS — Z719 Counseling, unspecified: Secondary | ICD-10-CM

## 2021-04-16 DIAGNOSIS — F840 Autistic disorder: Secondary | ICD-10-CM | POA: Diagnosis not present

## 2021-04-16 DIAGNOSIS — F902 Attention-deficit hyperactivity disorder, combined type: Secondary | ICD-10-CM | POA: Diagnosis not present

## 2021-04-16 DIAGNOSIS — Z789 Other specified health status: Secondary | ICD-10-CM

## 2021-04-16 DIAGNOSIS — Z7189 Other specified counseling: Secondary | ICD-10-CM

## 2021-04-16 MED ORDER — SERTRALINE HCL 100 MG PO TABS: ORAL_TABLET | ORAL | 2 refills | Status: DC

## 2021-04-16 MED ORDER — ARIPIPRAZOLE 10 MG PO TABS: 10.0000 mg | ORAL_TABLET | Freq: Every day | ORAL | 2 refills | Status: DC

## 2021-04-16 MED ORDER — BUSPIRONE HCL 30 MG PO TABS: 30.0000 mg | ORAL_TABLET | Freq: Two times a day (BID) | ORAL | 0 refills | Status: DC

## 2021-04-16 MED ORDER — AMPHETAMINE SULFATE 10 MG PO TABS: 10.0000 mg | ORAL_TABLET | Freq: Every day | ORAL | 0 refills | Status: DC

## 2021-04-16 NOTE — Progress Notes (Signed)
DEVELOPMENTAL AND PSYCHOLOGICAL CENTER [facility] [address] [place] [place] [address] Dept: [phone] Dept Fax: [phone]  Medication Check visit via Virtual Video   Patient ID:  Michael [name]  male DOB: [birth year]   30 y.o.   MRN: [id number]   DATE:04/16/21  PCP: Georgann Housekeeper, MD   Virtual Visit via Video Note  I connected with  Michael [name] on 04/16/21 at  2:00 PM EDT by a video enabled telemedicine application and verified that I am speaking with the correct person using two identifiers. Patient/Parent Location: at work location   I discussed the limitations, risks, security and privacy concerns of performing an evaluation and management service by telephone and the availability of in person appointments. I also discussed with the parents that there may be a patient responsible charge related to this service. The parents expressed understanding and agreed to proceed.  Provider: Carron Curie, NP  Location: private work location  HPI/CURRENT STATUS: Michael Bonds Plocher is here for medication management of the psychoactive medications for ADHD and review of educational and behavioral concerns.   Michael [name] currently taking Zoloft, Evekeo, and Buspar, which is working well. Takes medication at 10:00 am. Medication tends to wear off around evening time. Michael [name] is able to focus through school/homework.   Michael [name] is eating well (eating breakfast, lunch and dinner). Slight increase but nothing significant  Sleeping well (goes to bed at 11:00 pm wakes at 8:00 am), sleeping through the night. None reported recently.   EDUCATION: School: [employer]:  graduate school   Performance/ Grades: average Services: IEP/504 Plan and Other: [employer]  Activities/ Exercise: intermittently, walking on occasion  Screen time: (phone, tablet, TV, computer): computer for learning, phone, and games.   MEDICAL HISTORY: Individual Medical  History/ Review of Systems: Endocrine for recent visit on 02/22/2021 for adjustment of estrogen dose to 6 mg daily. No other reported visits.   Family Medical/ Social History: Changes? None reported Patient Lives with: parents with no recent changes.   MENTAL HEALTH: Mental Health Issues:   Depression and Anxiety-counseling having video weekly visits. NO reported changes with increased stressors.   Allergies: Allergies  Allergen Reactions   Shellfish Allergy     Current Medications:  Current Outpatient Medications  Medication Instructions   Amphetamine Sulfate (EVEKEO) 10 mg, Oral, Daily   ARIPiprazole (ABILIFY) 10 mg, Oral, Daily   busPIRone (BUSPAR) 30 mg, Oral, 2 times daily   estradiol (ESTRACE) 6 mg, Oral, Daily   finasteride (PROSCAR) 5 mg, Oral, Daily   norethindrone (MICRONOR) 0.35 MG tablet TAKE 1 TABLET BY MOUTH EVERY DAY   sertraline (ZOLOFT) 100 MG tablet TAKE 1.5 TABLETS BY MOUTH DAILY   spironolactone (ALDACTONE) 50 mg, Oral, Daily   Medication Side Effects: None  DIAGNOSES:    ICD-10-CM   1. ADHD (attention deficit hyperactivity disorder), combined type  F90.2     2. Autistic spectrum disorder  F84.0     3. Dysgraphia  R27.8     4. Generalized anxiety disorder  F41.1     5. Male-to-male transgender person  Z27.9     6. History of depression  Z86.59     7. Medication management  Z79.899     8. Patient counseled  Z71.9     9. 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 Has accommodation  through the [employer] office for learning and ADHD support. Michael [name] has a history of ADHD, Anxiety, ASD MDD, and Learning Difficulties that have been well controlled with Evekeo, Zoloft, Buspar, and Abilify. Discussed medical updates with specialists recently along with future appointments. No changes with family, health, eating or sleeping since last f/u  visit. Weekly counseling session have continued virtually for the history anxiety and depression. NO changes needed today with medication or dose adjustment per patient.   PLAN/RECOMMENDATIONS:  Patient provided updates with health and medical changes since last f/u visit on 01/10/2021.  [employer] has continued to provide formal services for the learning needs on campus with the [employer] offices.  Due to a history of anxiety, depression and gender transitioning patient has continued with support through counseling services on a weekly basis.   Updates provided on recent medical specialist with adjustment to hormone levels recently. Future surgery for feminization scheduled at the end of the year with Dr. Sherrie Mustache at [facility].  Discussed healthy eating habits, more physical activity and drinking enough water daily for hydration.   Encouraged recommended limitations on TV, tablets, phones, video games and computers for non-educational activities to 2 hours daily.   Discussed need for bedtime routine, use of good sleep hygiene, no video games, TV or phones for an hour before bedtime.   Counseled medication pharmacokinetics, options, dosage, administration, desired effects, and possible side effects.   Evekeo 10 mg daily, # 30 with no RF's Zoloft 100 mg 1 1/2 tablet, # 45 with 2 RF's Buspar 30 mg BID, # 180 with no RF's Abilify 10 mg daily, # 90 with 2 RF's RX for above e-scribed and sent to pharmacy on record  [id number] [name], [place] - [address] [place] [address] Phone: [phone] Fax: [phone]  I discussed the assessment and treatment plan with the patient. The patient was provided an opportunity to ask questions and all were answered. The patient agreed with the plan and demonstrated an understanding of the instructions.   I provided 26 minutes of non-face-to-face time during this encounter. Completed record review for 10 minutes prior to  the virtual video visit.   NEXT APPOINTMENT:  Visit date not found  Return in about 3 months (around 07/17/2021) for f/u visit.  The patient was advised to call back or seek an in-person evaluation if the symptoms worsen or if the condition fails to improve as anticipated.   Carron Curie, NP

## 2021-05-24 ENCOUNTER — Other Ambulatory Visit: Payer: Self-pay

## 2021-05-24 MED ORDER — AMPHETAMINE SULFATE 10 MG PO TABS: 10.0000 mg | ORAL_TABLET | Freq: Every day | ORAL | 0 refills | Status: DC

## 2021-05-24 NOTE — Telephone Encounter (Signed)
Evekeo 10 mg daily, #30 with no RF's. .RX for above e-scribed and sent to pharmacy on record  [id number] [place], [place] - [address] [place] [address] Phone: [phone] Fax: [phone]   

## 2021-05-26 ENCOUNTER — Other Ambulatory Visit: Payer: Self-pay | Admitting: Endocrinology

## 2021-06-21 ENCOUNTER — Other Ambulatory Visit: Payer: Self-pay

## 2021-06-21 MED ORDER — AMPHETAMINE SULFATE 10 MG PO TABS: 10.0000 mg | ORAL_TABLET | Freq: Every day | ORAL | 0 refills | Status: DC

## 2021-06-21 NOTE — Telephone Encounter (Signed)
E-Prescribed Evekeo 10 directly to  [id number] - [place], [place] - [address] [place] [address] Phone: [phone] Fax: [phone]   

## 2021-07-26 ENCOUNTER — Other Ambulatory Visit: Payer: Self-pay

## 2021-07-26 ENCOUNTER — Telehealth (INDEPENDENT_AMBULATORY_CARE_PROVIDER_SITE_OTHER): Payer: BC Managed Care – PPO | Admitting: Family

## 2021-07-26 ENCOUNTER — Encounter: Payer: Self-pay | Admitting: Family

## 2021-07-26 DIAGNOSIS — R278 Other lack of coordination: Secondary | ICD-10-CM

## 2021-07-26 DIAGNOSIS — F902 Attention-deficit hyperactivity disorder, combined type: Secondary | ICD-10-CM

## 2021-07-26 DIAGNOSIS — Z719 Counseling, unspecified: Secondary | ICD-10-CM

## 2021-07-26 DIAGNOSIS — F411 Generalized anxiety disorder: Secondary | ICD-10-CM

## 2021-07-26 DIAGNOSIS — Z79899 Other long term (current) drug therapy: Secondary | ICD-10-CM

## 2021-07-26 DIAGNOSIS — F840 Autistic disorder: Secondary | ICD-10-CM

## 2021-07-26 DIAGNOSIS — Z789 Other specified health status: Secondary | ICD-10-CM

## 2021-07-26 DIAGNOSIS — F332 Major depressive disorder, recurrent severe without psychotic features: Secondary | ICD-10-CM

## 2021-07-26 DIAGNOSIS — Z7189 Other specified counseling: Secondary | ICD-10-CM

## 2021-07-26 MED ORDER — SERTRALINE HCL 100 MG PO TABS: ORAL_TABLET | ORAL | 2 refills | Status: DC

## 2021-07-26 MED ORDER — EVEKEO ODT 15 MG PO TBDP: 15.0000 mg | ORAL_TABLET | Freq: Every day | ORAL | 0 refills | Status: DC

## 2021-07-26 NOTE — Progress Notes (Signed)
[facility] [address] [place]. [address] [place] [address] Dept: [phone] Dept Fax: [phone]  Medication Check visit via Virtual Video   Patient ID:  Michael [name]  male DOB: 1990-12-09   30 y.o.   MRN: [id number]   DATE:07/26/21  PCP: Georgann Housekeeper, MD  Virtual Visit via Video Note  I connected with  Michael [name]  on 07/26/21 at  2:00 PM EDT by a video enabled telemedicine application and verified that I am speaking with the correct person using two identifiers. Patient/Parent Location: at home   I discussed the limitations, risks, security and privacy concerns of performing an evaluation and management service by telephone and the availability of in person appointments. I also discussed with the parents that there may be a patient responsible charge related to this service. The parents expressed understanding and agreed to proceed.  Provider: Carron Curie, NP  Location: private work location  HPI/CURRENT STATUS: Michael [name] is here for medication management of the psychoactive medications for ADHD and review of educational and behavioral concerns.   Michael [name] currently taking medication regimen, which is working well. Takes medication at 10:00. Medication tends to wear off around evening time. Michael [name] is able to focus through school/homework.   Michael [name] is eating well (eating breakfast, lunch and dinner). Eating with no reported changes  Sleeping well (goes to bed at 10:00 pm wakes at 10:00 am), sleeping through the night. No reported issues.   EDUCATION: School: [employer]:  graduate school   Performance/ Grades: average Services: IEP/504 Plan, [employer] at [employer]  Activities/ Exercise: intermittently  Screen time: (phone, tablet, TV, computer): computer for school, phone, TV and games.   MEDICAL HISTORY: Individual Medical History/ Review of Systems: Recent visit with Endocrine with  changes to medication.  Family Medical/ Social History: Changes? No Patient Lives with:  on campus during the week and home on the weekend.  2 dogs and getting a Pug Puppy  MENTAL HEALTH: Mental Health Issues: Anxiety-well controlled   Allergies: Allergies  Allergen Reactions   Shellfish Allergy    Current Medications:  Current Outpatient Medications on File Prior to Visit  Medication Sig Dispense Refill   ARIPiprazole (ABILIFY) 10 MG tablet Take 1 tablet (10 mg total) by mouth daily. 90 tablet 2   busPIRone (BUSPAR) 30 MG tablet Take 1 tablet (30 mg total) by mouth 2 (two) times daily. 180 tablet 0   estradiol (ESTRACE) 2 MG tablet Take 3 tablets (6 mg total) by mouth daily. 270 tablet 3   finasteride (PROSCAR) 5 MG tablet TAKE 1 TABLET BY MOUTH EVERY DAY 90 tablet 3   norethindrone (MICRONOR) 0.35 MG tablet TAKE 1 TABLET BY MOUTH EVERY DAY 84 tablet 2   spironolactone (ALDACTONE) 50 MG tablet Take 1 tablet (50 mg total) by mouth daily. 90 tablet 3   No current facility-administered medications on file prior to visit.   Medication Side Effects: None  DIAGNOSES:    ICD-10-CM   1. ADHD (attention deficit hyperactivity disorder), combined type  F90.2     2. Generalized anxiety disorder  F41.1     3. Male-to-male transgender person  Z27.9     4. Autistic spectrum disorder  F84.0     5. Dysgraphia  R27.8     6. Severe episode of recurrent major depressive disorder, without psychotic features (HCC)  F33.2     7. Medication management  Z79.899     8.  Patient counseled  Z71.9     9. Goals of care, counseling/discussion  Z71.89      ASSESSMENT: [name] is a 30 year old transgender person with a history of ADHD, Anxiety, Learning and ASD.  Michael [name] has been maintained on the current medication regimen with no reported side effects or issues. Has continued with graduate school at [employer] with OARS services in place for learning and attention needs. Updates with medication through  endocrinology for transitioning hormones recently. Weekly counseling was consistent for anxiety and depression. No other updates with health care in the past 3 months. Eating with no changes and sleeping with no reported issues. Medication regimen to stay the same and f/u in 3 months.  PLAN/RECOMMENDATIONS:  Discussed changes and updates with health and medical issues in the past 3 months since last f/u visit on 04/16/2021.  Reviewed academics and formal services received at [facility] for accommodations through the [place] office.   Consistency with support services for anxiety and depression recommended with counseling.   Medical updates provided with f/u visits and changes with medication dosing discussed at today's visit. No future surgical plans due to cost of procedures and lack of coverage by insurance.   Reviewed eating habits and getting more physical activity when possible. Also encouraged more water intake daily.  Limitations on TV, tablets, phones, video games and computers for non-educational activities for 2 hours maximum daily, as recommended.    Discussed need for bedtime routine, use of good sleep hygiene, no video games, TV or phones for an hour before bedtime.   Counseled medication pharmacokinetics, options, dosage, administration, desired effects, and possible side effects.   Evekeo changed to ODT 15 mg daily, # 30 with no RF's Zoloft 100 mg 1 1/2 tablets daily, # 45 with 2 RF's Buspar 30 mg BID PRN, no Rx today Abilify 10 mg daily, no Rx today RX for above e-scribed and sent to pharmacy on record  [id number] [name], [place] - [address] [place] [address] Phone: [phone] Fax: [phone]  I discussed the assessment and treatment plan with the patient. The patient was provided an opportunity to ask questions and all were answered. The patient agreed with the plan and demonstrated an understanding of the instructions.   I  provided 41 minutes of non-face-to-face time during this encounter. Completed record review for 10 minutes prior to the virtual video visit.   NEXT APPOINTMENT:  Visit date not found  Return in about 3 months (around 10/26/2021) for f/u visit.  The patient was advised to call back or seek an in-person evaluation if the symptoms worsen or if the condition fails to improve as anticipated.   Carron Curie, NP

## 2021-07-29 ENCOUNTER — Encounter: Payer: Self-pay | Admitting: Family

## 2021-07-29 ENCOUNTER — Telehealth: Payer: Self-pay

## 2021-08-01 ENCOUNTER — Other Ambulatory Visit: Payer: Self-pay | Admitting: Endocrinology

## 2021-08-01 DIAGNOSIS — Z789 Other specified health status: Secondary | ICD-10-CM

## 2021-08-01 NOTE — Telephone Encounter (Signed)
Outcome Deniedon October 19 Your request has been denied

## 2021-08-19 ENCOUNTER — Other Ambulatory Visit: Payer: Self-pay | Admitting: Endocrinology

## 2021-08-30 ENCOUNTER — Other Ambulatory Visit: Payer: Self-pay

## 2021-08-30 ENCOUNTER — Ambulatory Visit (INDEPENDENT_AMBULATORY_CARE_PROVIDER_SITE_OTHER): Payer: BC Managed Care – PPO | Admitting: Endocrinology

## 2021-08-30 VITALS — BP 120/74 | HR 86 | Ht 65.7 in | Wt 146.6 lb

## 2021-08-30 DIAGNOSIS — Z789 Other specified health status: Secondary | ICD-10-CM

## 2021-08-30 NOTE — Patient Instructions (Addendum)
Blood tests are requested for you today.  We'll let you know about the results.  Please see a urology specialist.  you will receive a phone call, about a day and time for an appointment.  Please come back for a follow-up appointment in 6 months.   

## 2021-08-30 NOTE — Progress Notes (Signed)
Subjective:    Patient ID: Michael [name], adult    DOB: Sep 15, 1991, 30 y.o.   MRN: [id number]  HPI Pt returns for f/u of transgender state (M to F).   Surgery: never, but he is considering with [facility].   Medication: E2 (since 2020), finasteride, spironolactone, and norethindrone (since 2021).   Counseling: goes to [facility] Other: She has no risk factors for E2 rx.   SDOH: pt's father has questioned transgender dx; ins declined elagolix.   Interval hx: pt says since on increased E2, she reports ongoing breast growth.  She feels as though this also helps dysphoria.  She has not recently missed meds. Surg is delayed due to ins.   Past Medical History:  Diagnosis Date   ADHD (attention deficit hyperactivity disorder)    Anxiety    Autism     No past surgical history on file.  Social History   Socioeconomic History   Marital status: Single    Spouse name: Not on file   Number of children: Not on file   Years of education: Not on file   Highest education level: Not on file  Occupational History   Not on file  Tobacco Use   Smoking status: Never   Smokeless tobacco: Never  Substance and Sexual Activity   Alcohol use: Not on file   Drug use: Not on file   Sexual activity: Not on file  Other Topics Concern   Not on file  Social History Narrative   Not on file   Social Determinants of Health   Financial Resource Strain: Not on file  Food Insecurity: Not on file  Transportation Needs: Not on file  Physical Activity: Not on file  Stress: Not on file  Social Connections: Not on file  Intimate Partner Violence: Not on file    Current Outpatient Medications on File Prior to Visit  Medication Sig Dispense Refill   Amphetamine Sulfate (EVEKEO ODT) 15 MG TBDP Take 15 mg by mouth daily. 30 tablet 0   ARIPiprazole (ABILIFY) 10 MG tablet Take 1 tablet (10 mg total) by mouth daily. 90 tablet 2   busPIRone (BUSPAR) 30 MG tablet Take 1 tablet (30 mg total) by mouth 2 (two) times daily.  180 tablet 0   estradiol (ESTRACE) 2 MG tablet TAKE 3 TABLETS (6 MG TOTAL) BY MOUTH DAILY. 270 tablet 3   finasteride (PROSCAR) 5 MG tablet TAKE 1 TABLET BY MOUTH EVERY DAY 90 tablet 3   norethindrone (MICRONOR) 0.35 MG tablet TAKE 1 TABLET BY MOUTH EVERY DAY 84 tablet 2   sertraline (ZOLOFT) 100 MG tablet TAKE 1.5 TABLETS BY MOUTH DAILY 45 tablet 2   spironolactone (ALDACTONE) 50 MG tablet Take 1 tablet (50 mg total) by mouth daily. 90 tablet 3   No current facility-administered medications on file prior to visit.    Allergies  Allergen Reactions   Shellfish Allergy     Family History  Problem Relation Age of Onset   Asthma Mother    Depression Mother    Learning disabilities Mother    ADD / ADHD Father    Other Father        low testosterone   Speech disorder Brother     BP 120/74 (BP Location: Right Arm, Patient Position: Sitting, Cuff Size: Normal)   Pulse 86   Ht 5' 5.7" (1.669 m)   Wt 146 lb 9.6 oz (66.5 kg)   SpO2 96%   BMI 23.88 kg/m    Review  of Systems Denies breast pain    Objective:   Physical Exam EXT: no leg edema      Assessment & Plan:  Transgender state: uncontrolled.  Check labs: Please continue the same E2 pending result.   Patient Instructions  Blood tests are requested for you today.  We'll let you know about the results.   Please see a urology specialist.  you will receive a phone call, about a day and time for an appointment Please come back for a follow-up appointment in 6 months.

## 2021-09-02 ENCOUNTER — Other Ambulatory Visit: Payer: Self-pay

## 2021-09-02 MED ORDER — BUSPIRONE HCL 30 MG PO TABS: 30.0000 mg | ORAL_TABLET | Freq: Two times a day (BID) | ORAL | 0 refills | Status: DC

## 2021-09-02 MED ORDER — EVEKEO ODT 15 MG PO TBDP: 15.0000 mg | ORAL_TABLET | Freq: Every day | ORAL | 0 refills | Status: DC

## 2021-09-02 NOTE — Telephone Encounter (Signed)
Evekeo 15 mg ODT, # 30 with no RF's and Buspar 30 mg BID, # 180 with no RF's.RX for above e-scribed and sent to pharmacy on record  [phone], [place] - [address] [place] [address] Phone: [phone] Fax: [phone]

## 2021-09-16 MED ORDER — EVEKEO ODT 15 MG PO TBDP: 15.0000 mg | ORAL_TABLET | Freq: Every day | ORAL | 0 refills | Status: DC

## 2021-09-16 NOTE — Addendum Note (Signed)
Addended by: Burgess Estelle on: 09/16/2021 11:35 AM   Modules accepted: Orders

## 2021-09-16 NOTE — Telephone Encounter (Signed)
E-Prescribed Evekeo XR 15 mg ODT directly to  [facility] [address], [place] - [address] [place] [address] Phone: [phone] Fax: [phone]

## 2021-09-16 NOTE — Addendum Note (Signed)
Addended by: Elvera Maria R on: 09/16/2021 01:31 PM   Modules accepted: Orders

## 2021-09-16 NOTE — Telephone Encounter (Signed)
[facility] patient would like RX sent to [facility] on [address]

## 2021-09-18 ENCOUNTER — Other Ambulatory Visit: Payer: Self-pay

## 2021-09-18 LAB — ESTRADIOL, FREE
Estradiol, Serum, MS: 150 pg/mL — ABNORMAL HIGH
Free Estradiol, Percent: 1.7 %
Free Estradiol, Serum: 2.6 pg/mL — ABNORMAL HIGH

## 2021-09-18 MED ORDER — DYANAVEL XR 10 MG PO CHER: 10.0000 mg | CHEWABLE_EXTENDED_RELEASE_TABLET | Freq: Every day | ORAL | 0 refills | Status: DC

## 2021-09-18 NOTE — Telephone Encounter (Signed)
Evekeo unable to be obtained due to shortage and patient to try Dyanvel XR tablets 10 mg daily, # 30 with no RF's.RX for above e-scribed and sent to pharmacy on record  [phone] [place], [place] - [address] [place] [address] Phone: [phone] Fax: [phone]  rc

## 2021-09-19 ENCOUNTER — Telehealth: Payer: Self-pay

## 2021-10-18 ENCOUNTER — Telehealth: Payer: BC Managed Care – PPO | Admitting: Family

## 2021-10-21 ENCOUNTER — Other Ambulatory Visit: Payer: Self-pay

## 2021-10-21 ENCOUNTER — Encounter: Payer: Self-pay | Admitting: Family

## 2021-10-21 ENCOUNTER — Telehealth (INDEPENDENT_AMBULATORY_CARE_PROVIDER_SITE_OTHER): Payer: BC Managed Care – PPO | Admitting: Family

## 2021-10-21 DIAGNOSIS — F411 Generalized anxiety disorder: Secondary | ICD-10-CM | POA: Diagnosis not present

## 2021-10-21 DIAGNOSIS — F902 Attention-deficit hyperactivity disorder, combined type: Secondary | ICD-10-CM | POA: Diagnosis not present

## 2021-10-21 DIAGNOSIS — Z79899 Other long term (current) drug therapy: Secondary | ICD-10-CM

## 2021-10-21 DIAGNOSIS — Z789 Other specified health status: Secondary | ICD-10-CM

## 2021-10-21 DIAGNOSIS — R278 Other lack of coordination: Secondary | ICD-10-CM

## 2021-10-21 DIAGNOSIS — F819 Developmental disorder of scholastic skills, unspecified: Secondary | ICD-10-CM

## 2021-10-21 DIAGNOSIS — Z719 Counseling, unspecified: Secondary | ICD-10-CM

## 2021-10-21 DIAGNOSIS — F333 Major depressive disorder, recurrent, severe with psychotic symptoms: Secondary | ICD-10-CM

## 2021-10-21 DIAGNOSIS — F840 Autistic disorder: Secondary | ICD-10-CM

## 2021-10-21 MED ORDER — DYANAVEL XR 10 MG PO CHER: 10.0000 mg | CHEWABLE_EXTENDED_RELEASE_TABLET | Freq: Every day | ORAL | 0 refills | Status: DC

## 2021-10-21 MED ORDER — SERTRALINE HCL 100 MG PO TABS: ORAL_TABLET | ORAL | 2 refills | Status: DC

## 2021-10-21 NOTE — Progress Notes (Signed)
[facility]. [address]  [place] [address] Dept: [phone] Dept Fax: [phone]  Medication Check visit via Virtual Video   Patient ID:  Michael [name]  male DOB: Nov 11, 1990   31 y.o.   MRN: [id number]   DATE:10/21/21  PCP: Wenda Low, MD  Virtual Visit via Video Note  I connected with  Michael [name] on 10/21/21 at  1:00 PM EST by a video enabled telemedicine application and verified that I am speaking with the correct person using two identifiers. Patient/Parent Location: at home   I discussed the limitations, risks, security and privacy concerns of performing an evaluation and management service by telephone and the availability of in person appointments. I also discussed with the parents that there may be a patient responsible charge related to this service. The parents expressed understanding and agreed to proceed.  Provider: Carolann Littler, NP  Location: private work location  HPI/CURRENT STATUS: Michael Noble Herd is here for medication management of the psychoactive medications for ADHD and review of educational and behavioral concerns.   Michael [name] currently taking Dyanavel XR 10 mg, Buspar 30 mg,  and Zoloft 100 mg daily, which is working well. Takes medication at 8:00 am. Medication tends to wear off around evening for the Dyanavel XR. Michael [name] is able to focus through school/homework.   Michael [name] is eating well (eating breakfast, lunch and dinner). Michael [name] does not have appetite suppression  Sleeping well (goes to bed at 11:00 pm wakes at 8:00 am), sleeping through the night. Michael [name] does not have delayed sleep onset  EDUCATION: School: [employer]-sociology and criminology Year/Grade:  graduate school   Performance/ Grades: average Services: Other: Disability Services on campus  Activities/ Exercise: intermittently with walking on campus  MEDICAL HISTORY: Individual Medical History/  Review of Systems: December had Endocrinology f/u for hormone regulation and blood work completed at the visit.  Has been healthy with no visits to the PCP. [place] due yearly..   Family Medical/ Social History: Changes? None Patient Lives with: parents and 2 new puppies  MENTAL HEALTH: Mental Health Issues:   Depression and Anxiety-counseling services every 2 weeks.    Allergies: Allergies  Allergen Reactions   Shellfish Allergy    Current Medications:  Current Outpatient Medications  Medication Instructions   ARIPiprazole (ABILIFY) 10 mg, Oral, Daily   busPIRone (BUSPAR) 30 mg, Oral, 2 times daily   Dyanavel XR 10 mg, Oral, Daily   estradiol (ESTRACE) 6 mg, Oral, Daily   finasteride (PROSCAR) 5 MG tablet TAKE 1 TABLET BY MOUTH EVERY DAY   norethindrone (MICRONOR) 0.35 MG tablet TAKE 1 TABLET BY MOUTH EVERY DAY   sertraline (ZOLOFT) 100 MG tablet TAKE 1.5 TABLETS BY MOUTH DAILY   spironolactone (ALDACTONE) 50 mg, Oral, Daily   Medication Side Effects: None  DIAGNOSES:    ICD-10-CM   1. ADHD (attention deficit hyperactivity disorder), combined type  F90.2     2. Autistic spectrum disorder  F84.0     3. Dysgraphia  R27.8     4. Generalized anxiety disorder  F41.1     5. Male-to-male transgender person  Z3.9     6. MDD (major depressive disorder), recurrent, severe, with psychosis ([place])  F33.3     7. Learning difficulty  F81.9     8. Medication management  Z79.899     9. Patient counseled  Z71.9      ASSESSMENT:      Michael [name] is a 31  year old person with a history of ADHD, Anxiety/Depression, ASD, MDD and learning difficulties. Michael [name] is attending [employer] for graduate school and taking classes this semester. Disability services are in place for academic and attention support. Has been well controlled on the current medication regimen with no side effects reported. Recent change from Evekeo to Dyanavel XR tablet due to permanent back order of the medication. Counseling sessions  has continued and are now every other week. Recently adopted to puppies before Christmas time and has been involved with their care. Recent visit to Endocrinology for routine visit along with medication updates for hormone adjustment, if needed. No other changes in health care. Eating and sleeping with no concerns. No changes with medication regimen or dosing at the visit today.   PLAN/RECOMMENDATIONS:  Updates for school, academics, classes this semester and schedule.  Disability services in place and discussed any changes for the semester.   School plans and classes reviewed for the semester along with schedule.  Recent comfort with 2 new puppies in the house just before Christmas. Michael [name] is adjusting to having more emotional support along with caring for the new 'babies."  Counseling has continued on a regular basis. Every two weeks with positive progress and results expressed. Parents are still not willing to meet via counselor and support given to patient.   Updates with Endocrinology f/u with medications. No recent changes and has continued with medications as previous. Blood work and routine visits scheduled.   No sleep concerns and getting plenty of rest each night. Reviewed sleep hygiene and schedule with patient.   Counseled medication pharmacokinetics, options, dosage, administration, desired effects, and possible side effects.   Abilify 10 mg daily, no Rx today Bupsar 30 mg 1-2 daily, PRN, no Rx today Zoloft 100 mg 1-11/2 tablets daily,# 45 with 2 RF's Evekeo discontinued due to limited access to fill the Rx Started Dyanavel XR 10 mg tablet with no concerns, RF for # 30 with no RF's sent today.RX for above e-scribed and sent to pharmacy on record  [id number] [place], [place] - [address] [place] [address] Phone: [phone] Fax: [phone]  I discussed the assessment and treatment plan with the  patient.. The patient was provided an opportunity to ask questions and all were answered. The patient agreed with the plan and demonstrated an understanding of the instructions.   NEXT APPOINTMENT:  Visit date not found-April for next visit.  Telehealth OK  The patient was advised to call back or seek an in-person evaluation if the symptoms worsen or if the condition fails to improve as anticipated.   Carolann Littler, NP

## 2021-11-16 ENCOUNTER — Other Ambulatory Visit: Payer: Self-pay | Admitting: Family

## 2021-11-18 NOTE — Telephone Encounter (Signed)
E-Prescribed sertraline 100 mg tabs 90 days supply directly to  [id number] [place], [place] - [address] [place] [address] Phone: [phone] Fax: [phone]

## 2021-11-25 ENCOUNTER — Other Ambulatory Visit: Payer: Self-pay

## 2021-11-25 MED ORDER — DYANAVEL XR 10 MG PO CHER: 10.0000 mg | CHEWABLE_EXTENDED_RELEASE_TABLET | Freq: Every day | ORAL | 0 refills | Status: DC

## 2021-12-14 ENCOUNTER — Other Ambulatory Visit: Payer: Self-pay | Admitting: Family

## 2021-12-16 MED ORDER — DYANAVEL XR 10 MG PO CHER: 10.0000 mg | CHEWABLE_EXTENDED_RELEASE_TABLET | Freq: Every day | ORAL | 0 refills | Status: DC

## 2021-12-16 MED ORDER — BUSPIRONE HCL 30 MG PO TABS: 30.0000 mg | ORAL_TABLET | Freq: Two times a day (BID) | ORAL | 0 refills | Status: DC

## 2021-12-16 NOTE — Telephone Encounter (Signed)
Dyanavel XR tablets 10 mg dialy, # 30 with no RF's, Abilify 10 mg daily, # 90 with 0 RF's, and Buspar 30 mg 2 daily, # 180 with no RF's.RX for above e-scribed and sent to pharmacy on record ? ?[facility] [address] [place] - [address] ?[address] ?[place] [address] ?Phone: [phone] Fax: [phone] ? ? ?

## 2022-01-09 ENCOUNTER — Other Ambulatory Visit: Payer: Self-pay

## 2022-01-09 MED ORDER — ARIPIPRAZOLE 10 MG PO TABS: 10.0000 mg | ORAL_TABLET | Freq: Every day | ORAL | 0 refills | Status: DC

## 2022-01-09 MED ORDER — DYANAVEL XR 10 MG PO CHER: 10.0000 mg | CHEWABLE_EXTENDED_RELEASE_TABLET | Freq: Every day | ORAL | 0 refills | Status: DC

## 2022-01-09 MED ORDER — BUSPIRONE HCL 30 MG PO TABS: 30.0000 mg | ORAL_TABLET | Freq: Two times a day (BID) | ORAL | 0 refills | Status: DC

## 2022-01-09 NOTE — Telephone Encounter (Signed)
Dyanavel XR 10 mg daily, # 30 with no RF's, Buspar 180 mg BID, # 60 with 0 RF's, and Abilify 10 mg daily, # 90 with 0 RF's .RX for above e-scribed and sent to pharmacy on record ? ?[id number] [place],  - [address] ?[address] ?[place] [address] ?Phone: [phone] Fax: [phone] ? ? ? ? ?

## 2022-01-10 ENCOUNTER — Other Ambulatory Visit: Payer: Self-pay

## 2022-01-10 MED ORDER — SERTRALINE HCL 100 MG PO TABS: ORAL_TABLET | ORAL | 0 refills | Status: DC

## 2022-01-10 NOTE — Telephone Encounter (Signed)
Zoloft 100 mg 1 1/2 tablets daily, # 135 with no RF's.RX for above e-scribed and sent to pharmacy on record ? ?[id number] [place], [place] - [address] ?[address] ?[place] [address] ?Phone: [phone] Fax: [phone] ? ? ? ? ?

## 2022-02-03 ENCOUNTER — Telehealth (INDEPENDENT_AMBULATORY_CARE_PROVIDER_SITE_OTHER): Payer: BC Managed Care – PPO | Admitting: Family

## 2022-02-03 ENCOUNTER — Encounter: Payer: Self-pay | Admitting: Family

## 2022-02-03 DIAGNOSIS — R278 Other lack of coordination: Secondary | ICD-10-CM | POA: Diagnosis not present

## 2022-02-03 DIAGNOSIS — F902 Attention-deficit hyperactivity disorder, combined type: Secondary | ICD-10-CM

## 2022-02-03 DIAGNOSIS — F411 Generalized anxiety disorder: Secondary | ICD-10-CM

## 2022-02-03 DIAGNOSIS — Z789 Other specified health status: Secondary | ICD-10-CM

## 2022-02-03 DIAGNOSIS — Z7189 Other specified counseling: Secondary | ICD-10-CM

## 2022-02-03 DIAGNOSIS — Z719 Counseling, unspecified: Secondary | ICD-10-CM

## 2022-02-03 DIAGNOSIS — F332 Major depressive disorder, recurrent severe without psychotic features: Secondary | ICD-10-CM

## 2022-02-03 DIAGNOSIS — Z79899 Other long term (current) drug therapy: Secondary | ICD-10-CM

## 2022-02-03 MED ORDER — DYANAVEL XR 10 MG PO CHER: 10.0000 mg | CHEWABLE_EXTENDED_RELEASE_TABLET | Freq: Every day | ORAL | 0 refills | Status: DC

## 2022-02-03 NOTE — Progress Notes (Signed)
?[facility] ?[facility] ?[address], [address] ?[place] [address] ?Dept: [phone] ?Dept Fax: [phone] ? ?Medication Check visit via Virtual Video  ? ?Patient ID:  Michael [name]  male DOB: December 20, 1990   31 y.o.   MRN: [id number]  ? ?DATE:02/03/22 ? ?PCP: Georgann Housekeeper, MD ? ?Virtual Visit via Video Note ? ?I connected with  Michael M Zarazua on 02/03/22 at  1:00 PM EDT by a video enabled telemedicine application and verified that I am speaking with the correct person using two identifiers. Patient/Parent Location: at school ?  ?I discussed the limitations, risks, security and privacy concerns of performing an evaluation and management service by telephone and the availability of in person appointments. I also discussed with the parents that there may be a patient responsible charge related to this service. The parents expressed understanding and agreed to proceed. ? ?Provider: Carron Curie, NP  Location: private work location ? ?HPI/CURRENT STATUS: ?Michael [name] is here for medication management of the psychoactive medications for ADHD and review of educational and behavioral concerns.  ? ?Michael currently taking Dyanavel XR 10 mg daily, Abilify 10 mg daily, Buspar 30 mg 1 tablet daily, and Zoloft 100 mg 1 tablet daily, which is working well. Takes medication in the morning daily. Medication tends to wear off around the evening time for his stimulant medication. Michael is able to focus through school & homework.  ? ?Michael is eating well (eating breakfast, lunch and dinner). Michael does not have appetite suppression and no concerns.  ? ?Sleeping well (getting plenty of sleep), sleeping through the night. Michael does not have delayed sleep onset ? ?EDUCATION: ?School: [employer] ?Year/Grade:  Graduate School   ?Performance/ Grades: average ?Services: Other: Disability Services on campus ?NO classes this summer ?Working on [employer] work this  summer ?Activities/ Exercise: intermittently ? ?MEDICAL HISTORY: ?Individual Medical History/ Review of Systems: Yes, has to f/u with endocrinology in May.   Has been healthy with no visits to the PCP. WCC due yearly.  ? ?Family Medical/ Social History: Changes? None  ?Patient Lives with: parents ? ?MENTAL HEALTH: ?Mental Health Issues: Depression and Anxiety with good symptom control on current medications and counseling every other week.  ? ?Allergies: ?Allergies  ?Allergen Reactions  ? Shellfish Allergy   ? ?Current Medications:  ?Current Outpatient Medications  ?Medication Instructions  ? ARIPiprazole (ABILIFY) 10 mg, Oral, Daily  ? busPIRone (BUSPAR) 30 mg, Oral, 2 times daily  ? Dyanavel XR 10 mg, Oral, Daily  ? estradiol (ESTRACE) 6 mg, Oral, Daily  ? finasteride (PROSCAR) 5 MG tablet TAKE 1 TABLET BY MOUTH EVERY DAY  ? norethindrone (MICRONOR) 0.35 MG tablet TAKE 1 TABLET BY MOUTH EVERY DAY  ? sertraline (ZOLOFT) 100 MG tablet TAKE 1 AND 1/2 TABLETS BY MOUTH EVERY DAY  ? spironolactone (ALDACTONE) 50 mg, Oral, Daily  ? ?Medication Side Effects: None ? ?DIAGNOSES:  ?  ICD-10-CM   ?1. ADHD (attention deficit hyperactivity disorder), combined type  F90.2   ?  ?2. Dysgraphia  R27.8   ?  ?3. Generalized anxiety disorder  F41.1   ?  ?4. Male-to-male transgender person  Z31.9   ?  ?5. Severe episode of recurrent major depressive disorder, without psychotic features (HCC)  F33.2   ?  ?6. Medication management  Z79.899   ?  ?7. Patient counseled  Z71.9   ?  ?8. Goals of care, counseling/discussion  Z71.89   ?  ? ?ASSESSMENT:    ?  Michael [name] is a 31 year old transgender male with a history of ADHD, Anxiety, Depression, dysgraphia and learning difficulties. Michael has continued with the same medication regimen with good efficacy taking the medications in the morning.  No side effects or adverse effects reported. Academically doing well and working on graduate school thesis. Not taking classes this summer and still getting  services from school through disability. NO changes with eating, sleeping or healthcare updates in the past 3 months. No change with medications. Will continue with the same regimen with no concerns.  ? ?PLAN/RECOMMENDATIONS:  ?Updates for school, academics, progress and recent requirements for graduate degree. ? ?School plans and summer thesis work discussed with patient.  ? ?Still has disability services in place with no recent changes reported. ? ?No healthcare changes and no f/u with endocrinology for hormonal changes. ? ?Walking some on campus and encouraged regular activity. ? ?Eating well and continued to support healthy eating habits along with hydration. ? ?Supported regular counseling services every other week to address anxiety and depression. ? ?Sleep habits and sleeping plenty each night with no current concerns. ? ?Counseled medication pharmacokinetics, options, dosage, administration, desired effects, and possible side effects.   ?Dyanavel XR 10 mg daily, # 30 with no RF's ?Abilfy 10 mg daily, no Rx today ?Buspar 30 mg 1-2 daily, no Rx today ?Zoloft 100 mg 1-1 1/2 tablets daily, no Rx today ?RX for above e-scribed and sent to pharmacy on record ? ?[facility] [address] [place] - [address] ?[address] ?[place] [address] ?Phone: [phone] Fax: [phone] ?  ?I discussed the assessment and treatment plan with the patient. The patient was provided an opportunity to ask questions and all were answered. The patient agreed with the plan and demonstrated an understanding of the instructions. ?  ?NEXT APPOINTMENT:  ?05/20/2022-f/u visit  Telehealth OK ? ?The patient was advised to call back or seek an in-person evaluation if the symptoms worsen or if the condition fails to improve as anticipated. ? ? ?Carron Curie, NP ? ?

## 2022-02-04 ENCOUNTER — Encounter: Payer: Self-pay | Admitting: Family

## 2022-02-23 ENCOUNTER — Other Ambulatory Visit: Payer: Self-pay | Admitting: Endocrinology

## 2022-02-28 ENCOUNTER — Ambulatory Visit: Payer: BC Managed Care – PPO | Admitting: Endocrinology

## 2022-03-14 ENCOUNTER — Other Ambulatory Visit: Payer: Self-pay

## 2022-03-14 MED ORDER — DYANAVEL XR 10 MG PO CHER: 10.0000 mg | CHEWABLE_EXTENDED_RELEASE_TABLET | Freq: Every day | ORAL | 0 refills | Status: DC

## 2022-03-14 NOTE — Telephone Encounter (Signed)
Dyanavel XR 10 mg daily # 30 with no RF's.RX for above e-scribed and sent to pharmacy on record  [place], [place] - [address] [place] [address] Phone: [phone] Fax: [phone]

## 2022-04-10 ENCOUNTER — Encounter: Payer: Self-pay | Admitting: Internal Medicine

## 2022-04-10 ENCOUNTER — Ambulatory Visit (INDEPENDENT_AMBULATORY_CARE_PROVIDER_SITE_OTHER): Payer: BC Managed Care – PPO | Admitting: Internal Medicine

## 2022-04-10 VITALS — BP 110/68 | HR 100 | Ht 65.7 in | Wt 139.8 lb

## 2022-04-10 DIAGNOSIS — Z789 Other specified health status: Secondary | ICD-10-CM

## 2022-04-10 DIAGNOSIS — F649 Gender identity disorder, unspecified: Secondary | ICD-10-CM

## 2022-04-10 LAB — CBC
HCT: 39.2 % (ref 39.0–52.0)
Hemoglobin: 13.2 g/dL (ref 13.0–17.0)
MCHC: 33.8 g/dL (ref 30.0–36.0)
MCV: 85.3 fl (ref 78.0–100.0)
Platelets: 247 10*3/uL (ref 150.0–400.0)
RBC: 4.59 Mil/uL (ref 4.22–5.81)
RDW: 13.5 % (ref 11.5–15.5)
WBC: 9.7 10*3/uL (ref 4.0–10.5)

## 2022-04-10 LAB — BASIC METABOLIC PANEL
BUN: 10 mg/dL (ref 6–23)
CO2: 26 mEq/L (ref 19–32)
Calcium: 9.9 mg/dL (ref 8.4–10.5)
Chloride: 104 mEq/L (ref 96–112)
Creatinine, Ser: 0.72 mg/dL (ref 0.40–1.50)
GFR: 123.97 mL/min (ref >60.00)
Glucose, Bld: 104 mg/dL — ABNORMAL HIGH (ref 70–99)
Potassium: 4.2 mEq/L (ref 3.5–5.1)
Sodium: 139 mEq/L (ref 135–145)

## 2022-04-10 LAB — LIPID PANEL
Cholesterol: 173 mg/dL (ref 0–200)
HDL: 58.2 mg/dL (ref >39.00)
LDL Cholesterol: 91 mg/dL (ref 0–99)
NonHDL: 114.75
Total CHOL/HDL Ratio: 3
Triglycerides: 118 mg/dL (ref 0.0–149.0)
VLDL: 23.6 mg/dL (ref 0.0–40.0)

## 2022-04-10 LAB — TESTOSTERONE: Testosterone: 32.92 ng/dL — ABNORMAL LOW (ref 300.00–890.00)

## 2022-04-10 NOTE — Progress Notes (Signed)
Name: Michael [name]  MRN/ DOB: [id number], 1990/12/05    Age/ Sex: 31 y.o., adult     PCP: Georgann Housekeeper, MD   Reason for Endocrinology Evaluation: Gender dysphoria      Initial Endocrinology Clinic Visit: 08/31/2019    PATIENT IDENTIFIER: Michael [name] is a 31 y.o., adult with a past medical history of gender dysphoria and ADHD. She has followed with [facility] since 08/30/2021 for consultative assistance with management of her Gender dysphoria .   HISTORICAL SUMMARY:    Pt is referred by for Dr Donette Larry, for rx of transgender state (M to F).  Pt reports she first suspected the transgender state at age 59.  She had puberty at the age of71.  She has never had surgery or medication for this.  He sees a Veterinary surgeon at [facility].  She has no children.  She has no h/o DVT, dyslipidemia, liver disease, kidney disease, heart disease, CVA, diabetes, smoking, gallbladder disease, blood disorders, osteoporosis, or cancer.  Pt started gender affirming hormonal treatment in 2020  SUBJECTIVE:    Today (04/10/2022):  Ms. Face is here for gender dysphoria .   Hair growth has decreased  Has noted increase in breast breast  Denies headaches  No Fh of breast cancer or VTE  Denies acne  Minimal changes in voice    Medication  Estradiol 2 mg 3 tabs daily  Spironolactone 50 mg daily   HISTORY:  Past Medical History:  Past Medical History:  Diagnosis Date   ADHD (attention deficit hyperactivity disorder)    Anxiety    Autism    Past Surgical History: No past surgical history on file. Social History:  reports that she has never smoked. She has never used smokeless tobacco. No history on file for alcohol use and drug use. Family History:  Family History  Problem Relation Age of Onset   Asthma Mother    Depression Mother    Learning disabilities Mother    ADD / ADHD Father    Other Father        low testosterone   Speech disorder Brother      HOME  MEDICATIONS: Allergies as of 04/10/2022       Reactions   Shellfish Allergy         Medication List        Accurate as of April 10, 2022  9:35 AM. If you have any questions, ask your nurse or doctor.          ARIPiprazole 10 MG tablet Commonly known as: ABILIFY Take 1 tablet (10 mg total) by mouth daily.   busPIRone 30 MG tablet Commonly known as: BUSPAR Take 1 tablet (30 mg total) by mouth 2 (two) times daily.   Dyanavel XR 10 MG Cher Generic drug: Amphetamine ER Take 10 mg by mouth daily.   estradiol 2 MG tablet Commonly known as: ESTRACE TAKE 3 TABLETS (6 MG TOTAL) BY MOUTH DAILY.   finasteride 5 MG tablet Commonly known as: PROSCAR TAKE 1 TABLET BY MOUTH EVERY DAY   norethindrone 0.35 MG tablet Commonly known as: MICRONOR TAKE 1 TABLET BY MOUTH EVERY DAY   sertraline 100 MG tablet Commonly known as: ZOLOFT TAKE 1 AND 1/2 TABLETS BY MOUTH EVERY DAY   spironolactone 50 MG tablet Commonly known as: ALDACTONE TAKE 1 TABLET BY MOUTH EVERY DAY          OBJECTIVE:   PHYSICAL EXAM: VS: BP 110/68 (BP Location: Left Arm, Patient  Position: Sitting, Cuff Size: Small)   Pulse 100   Ht 5' 5.7" (1.669 m)   Wt 139 lb 12.8 oz (63.4 kg)   SpO2 99%   BMI 22.77 kg/m   EXAM: General: Pt appears well and is in NAD  Neck: General: Supple without adenopathy. Thyroid: Thyroid size normal.  No goiter or nodules appreciated.  Lungs: Clear with good BS bilat with no rales, rhonchi, or wheezes  Heart: Auscultation: RRR.  Abdomen: Normoactive bowel sounds, soft, nontender, without masses or organomegaly palpable  Extremities:  BL LE: No pretibial edema normal ROM and strength.  Mental Status: Judgment, insight: Intact Orientation: Oriented to time, place, and person Mood and affect: No depression, anxiety, or agitation     DATA REVIEWED:  Latest Reference Range & Units 04/10/22 14:07  Sodium 135 - 145 mEq/L 139  Potassium 3.5 - 5.1 mEq/L 4.2  Chloride 96 -  112 mEq/L 104  CO2 19 - 32 mEq/L 26  Glucose 70 - 99 mg/dL 063 (H)  BUN 6 - 23 mg/dL 10  Creatinine 0.16 - 0.10 mg/dL 9.32  Calcium 8.4 - 35.5 mg/dL 9.9  GFR >73.22 mL/min 123.97  Total CHOL/HDL Ratio  3  Cholesterol 0 - 200 mg/dL 025  HDL Cholesterol >42.70 mg/dL 62.37  LDL (calc) 0 - 99 mg/dL 91  NonHDL  628.31  Triglycerides 0.0 - 149.0 mg/dL 517.6  VLDL 0.0 - 16.0 mg/dL 73.7  WBC 4.0 - 10.6 K/uL 9.7  RBC 4.22 - 5.81 Mil/uL 4.59  Hemoglobin 13.0 - 17.0 g/dL 26.9  HCT 48.5 - 46.2 % 39.2  MCV 78.0 - 100.0 fl 85.3  MCHC 30.0 - 36.0 g/dL 70.3  RDW 50.0 - 93.8 % 13.5  Platelets 150.0 - 400.0 K/uL 247.0     Latest Reference Range & Units 04/10/22 14:07  Prolactin 2.0 - 18.0 ng/mL 10.1  Glucose 70 - 99 mg/dL 182 (H)  Estradiol < OR = 39 pg/mL 145 (H)  Testosterone 300.00 - 890.00 ng/dL 99.37 (L)    ASSESSMENT / PLAN / RECOMMENDATIONS:   Gender Dysphoria :   -Male to male transition -Tolerating hormone affirming therapy without side effects -Estradiol is at goal < 200 PG/mL -Testosterone level is at goal <  50 mg/DL  Medications  Stop norethindrone Continue estradiol 2 mg, 3 tabs daily Continue spironolactone 50 mg daily   Signed electronically by: Lyndle Herrlich, MD  [facility]  [facility] [address] [place] [place], [place] [address] Phone: [phone] FAX: [phone]      CC: Georgann Housekeeper, MD [address] [place] [address] Phone: [phone]  Fax: [phone]   Return to Endocrinology clinic as below: Future Appointments  Date Time Provider Department Center  04/10/2022  1:30 PM Latisia Hilaire, Konrad Dolores, MD [facility] None  05/20/2022  2:00 PM Paretta-Leahey, Miachel Roux, NP [facility]

## 2022-04-11 ENCOUNTER — Encounter: Payer: Self-pay | Admitting: Internal Medicine

## 2022-04-11 LAB — ESTRADIOL: Estradiol: 145 pg/mL — ABNORMAL HIGH (ref <=39)

## 2022-04-11 LAB — PROLACTIN: Prolactin: 10.1 ng/mL (ref 2.0–18.0)

## 2022-04-11 MED ORDER — ESTRADIOL 2 MG PO TABS: 6.0000 mg | ORAL_TABLET | Freq: Every day | ORAL | 3 refills | Status: DC

## 2022-04-11 MED ORDER — SPIRONOLACTONE 50 MG PO TABS: 50.0000 mg | ORAL_TABLET | Freq: Every day | ORAL | 3 refills | Status: DC

## 2022-04-22 ENCOUNTER — Other Ambulatory Visit: Payer: Self-pay

## 2022-04-22 MED ORDER — SERTRALINE HCL 100 MG PO TABS: ORAL_TABLET | ORAL | 0 refills | Status: DC

## 2022-04-22 MED ORDER — BUSPIRONE HCL 30 MG PO TABS: 30.0000 mg | ORAL_TABLET | Freq: Two times a day (BID) | ORAL | 0 refills | Status: DC

## 2022-04-22 MED ORDER — DYANAVEL XR 10 MG PO CHER: 10.0000 mg | CHEWABLE_EXTENDED_RELEASE_TABLET | Freq: Every day | ORAL | 0 refills | Status: DC

## 2022-04-22 MED ORDER — ARIPIPRAZOLE 10 MG PO TABS: 10.0000 mg | ORAL_TABLET | Freq: Every day | ORAL | 0 refills | Status: DC

## 2022-04-22 NOTE — Telephone Encounter (Signed)
Dyanavel XR 10 mg daily, # 30 with no RF's, Abilify 10 mg # 90 with no RF's, Buspar 30 mg BID, #180 with no RF's, and Zoloft 100 mg 1 1/2 tablet daily, #135 with no RF's.RX for above e-scribed and sent to pharmacy on record  [phone] [place], [place] - [address] [place] [address] Phone: [phone] Fax: [phone]

## 2022-04-27 ENCOUNTER — Other Ambulatory Visit: Payer: Self-pay | Admitting: Family

## 2022-05-20 ENCOUNTER — Institutional Professional Consult (permissible substitution): Payer: BC Managed Care – PPO | Admitting: Family

## 2022-05-26 ENCOUNTER — Other Ambulatory Visit: Payer: Self-pay | Admitting: Internal Medicine

## 2022-06-24 ENCOUNTER — Ambulatory Visit (INDEPENDENT_AMBULATORY_CARE_PROVIDER_SITE_OTHER): Payer: BC Managed Care – PPO | Admitting: Family

## 2022-06-24 ENCOUNTER — Encounter: Payer: Self-pay | Admitting: Family

## 2022-06-24 VITALS — BP 110/64 | HR 76 | Resp 16 | Ht 65.75 in | Wt 135.0 lb

## 2022-06-24 DIAGNOSIS — R278 Other lack of coordination: Secondary | ICD-10-CM

## 2022-06-24 DIAGNOSIS — F840 Autistic disorder: Secondary | ICD-10-CM | POA: Diagnosis not present

## 2022-06-24 DIAGNOSIS — F902 Attention-deficit hyperactivity disorder, combined type: Secondary | ICD-10-CM

## 2022-06-24 DIAGNOSIS — F411 Generalized anxiety disorder: Secondary | ICD-10-CM | POA: Diagnosis not present

## 2022-06-24 DIAGNOSIS — Z789 Other specified health status: Secondary | ICD-10-CM

## 2022-06-24 DIAGNOSIS — Z8659 Personal history of other mental and behavioral disorders: Secondary | ICD-10-CM

## 2022-06-24 DIAGNOSIS — Z79899 Other long term (current) drug therapy: Secondary | ICD-10-CM

## 2022-06-24 DIAGNOSIS — Z719 Counseling, unspecified: Secondary | ICD-10-CM

## 2022-06-24 DIAGNOSIS — Z7189 Other specified counseling: Secondary | ICD-10-CM

## 2022-06-24 MED ORDER — DYANAVEL XR 10 MG PO CHER: 10.0000 mg | CHEWABLE_EXTENDED_RELEASE_TABLET | Freq: Every day | ORAL | 0 refills | Status: DC

## 2022-06-24 NOTE — Progress Notes (Signed)
[facility] [address] [place] [address] Dept: [phone] Dept Fax: [phone] Loc: [phone] Loc Fax: [phone]  Medication Check  Patient ID: Narda Bonds Leadbetter, adult  DOB: Feb 15, 1991, 31 y.o.  MRN: [id number]  Date of Evaluation: 06/24/2022  PCP: Georgann Housekeeper, MD  Accompanied by:  self Patient Lives with: parents  HISTORY/CURRENT STATUS: HPI Patient here by herself for the visit today. Interactive and appropriate with provider. No significant changes with medical history or medication changes. Continued with graduate school at [employer] for the fall semester. Has continued with her current medication regimen with no changes or concerns needed.   EDUCATION: School: [employer] Year/Grade:  Graduate School   [employer] Spent: depends on class demands Performance/ Grades:  no current issues reported Services: Other: Disability services Activities/ Exercise:  walking on campus  MEDICAL HISTORY: Appetite: No changes reported MVI/Other: None  Sleep: Bedtime: 2300  Awakens: 0900  Concerns: Initiation/Maintenance/Other: None   Individual Medical History/ Review of Systems: Changes? :Yes recently seen Endocrinology with no medication changes.   Allergies: Shellfish allergy  Current Medications:  Current Outpatient Medications  Medication Instructions   ARIPiprazole (ABILIFY) 10 mg, Oral, Daily   busPIRone (BUSPAR) 30 mg, Oral, 2 times daily   Dyanavel XR 10 mg, Oral, Daily   estradiol (ESTRACE) 6 mg, Oral, Daily   finasteride (PROSCAR) 5 MG tablet TAKE 1 TABLET BY MOUTH EVERY DAY   sertraline (ZOLOFT) 100 MG tablet TAKE 1 AND 1/2 TABLETS BY MOUTH EVERY DAY   spironolactone (ALDACTONE) 50 mg, Oral, Daily   Medication Side Effects: None Family Medical/ Social History: Changes? None   MENTAL HEALTH: Mental Health Issues:  Depression and Anxiety-[facility]-biweekly  PHYSICAL EXAM; Vitals:   General Physical Exam: Unchanged from previous exam, date:02/03/2022 Changed:None  DIAGNOSES:    ICD-10-CM   1. ADHD (attention deficit hyperactivity disorder), combined type  F90.2     2. Autistic spectrum disorder  F84.0     3. Dysgraphia  R27.8     4. Generalized anxiety disorder  F41.1     5. Male-to-male transgender person  Z27.9     6. History of depression  Z86.59     7. Medication management  Z79.899     8. Patient counseled  Z71.9     9. Goals of care, counseling/discussion  Z71.89      ASSESSMENT: [name] is a 31 year old transgender male with a history of ADHD, Anxitey, Dysgraphia and L/D. Medication regimen with Zoloft, Buspar, Abilify and Dyanavel tablets daily with good efficacy. Has continued with graduate school at [employer] with 3 classes for the fall semester. Disability services are in place at school with no changes reported. Routine medical checkups with the endocrinologist for medications related to hormone regulation. [name] has continued with regular counseling on a biweekly basis with good progress reported. Less anxiety and better emotional regulation. No recent changes reported for eating, sleeping or activity. No medication changes reported today.   RECOMMENDATIONS:  School updates with current graduate school program and courses taken for the fall with patient.  Disability services had remained in place with no recent changes reported.  Supported continued activity with exercise by walking on campus daily with classes.  Getting plenty of healthy food variety in the diet and adequate amount of water daily.   Encouraged continuation with counseling services on a regular basis to assist with coping mechanisms and emotional regulation.  Discussed recent healthcare visits and no changes with medication for hormone regulation. Support given for continued monitoring.   Sleep  schedule discussed with no current issues reported on a regular basis.   Medication regimen discussed with patient and will continue with same medication along with doses.   Counseled medication pharmacokinetics, options, dosage, administration, desired effects, and possible side effects.   Dyanavel XR 10 mg daily, # 30 with no RF's Abilfy 10 mg daily, no Rx today Buspar 30 mg 1-2 daily, no Rx today Zoloft 100 mg 1-1 1/2 tablets daily, no Rx today RX for above e-scribed and sent to pharmacy on record   [facility] [address], [place] - [address] [place] [address] Phone: [phone] Fax: [phone]   I discussed the assessment and treatment plan with the patient. The patient was provided an opportunity to ask questions and all were answered. The patient agreed with the plan and demonstrated an understanding of the instructions.  NEXT APPOINTMENT: Return in about 3 months (around 09/23/2022) for f/u visit .  The patient was advised to call back or seek an in-person evaluation if the symptoms worsen or if the condition fails to improve as anticipated.  Carron Curie, NP

## 2022-06-26 ENCOUNTER — Encounter: Payer: Self-pay | Admitting: Family

## 2022-07-15 ENCOUNTER — Other Ambulatory Visit: Payer: Self-pay

## 2022-07-18 ENCOUNTER — Other Ambulatory Visit: Payer: Self-pay

## 2022-07-18 MED ORDER — ARIPIPRAZOLE 10 MG PO TABS: 10.0000 mg | ORAL_TABLET | Freq: Every day | ORAL | 0 refills | Status: DC

## 2022-07-18 MED ORDER — SERTRALINE HCL 100 MG PO TABS: ORAL_TABLET | ORAL | 0 refills | Status: DC

## 2022-07-18 MED ORDER — DYANAVEL XR 10 MG PO CHER: 10.0000 mg | CHEWABLE_EXTENDED_RELEASE_TABLET | Freq: Every day | ORAL | 0 refills | Status: DC

## 2022-07-18 MED ORDER — BUSPIRONE HCL 30 MG PO TABS: 30.0000 mg | ORAL_TABLET | Freq: Two times a day (BID) | ORAL | 0 refills | Status: DC

## 2022-07-18 NOTE — Telephone Encounter (Signed)
Dyanavel XR 10 mg daily, # 30 with no RF's, Abilify 10 mg daily, #90 with no RF's, Buspar 30 mg BID, #180 with no RF's, and Zoloft 100 mg 1.5 tablets, #135 with no RF's.RX for above e-scribed and sent to pharmacy on record  [id number] [place], [place] - [address] [place] [address] Phone: [phone] Fax: [phone]

## 2022-07-30 NOTE — Telephone Encounter (Signed)
error 

## 2022-09-18 ENCOUNTER — Encounter: Payer: Self-pay | Admitting: Family

## 2022-09-19 ENCOUNTER — Other Ambulatory Visit: Payer: Self-pay

## 2022-09-19 MED ORDER — DYANAVEL XR 10 MG PO CHER: 10.0000 mg | CHEWABLE_EXTENDED_RELEASE_TABLET | Freq: Every day | ORAL | 0 refills | Status: DC

## 2022-09-19 NOTE — Telephone Encounter (Signed)
Dyanvel XR 10 mg daily, #30 with no RF's.RX for above e-scribed and sent to pharmacy on record  [phone] - [place], [place] - [address] [place] [address] Phone: [phone] Fax: [phone]

## 2022-10-03 ENCOUNTER — Encounter: Payer: Self-pay | Admitting: Family

## 2022-10-03 ENCOUNTER — Telehealth (INDEPENDENT_AMBULATORY_CARE_PROVIDER_SITE_OTHER): Payer: BC Managed Care – PPO | Admitting: Family

## 2022-10-03 DIAGNOSIS — F902 Attention-deficit hyperactivity disorder, combined type: Secondary | ICD-10-CM

## 2022-10-03 DIAGNOSIS — Z7189 Other specified counseling: Secondary | ICD-10-CM

## 2022-10-03 DIAGNOSIS — F411 Generalized anxiety disorder: Secondary | ICD-10-CM

## 2022-10-03 DIAGNOSIS — F840 Autistic disorder: Secondary | ICD-10-CM

## 2022-10-03 DIAGNOSIS — Z789 Other specified health status: Secondary | ICD-10-CM | POA: Diagnosis not present

## 2022-10-03 DIAGNOSIS — Z79899 Other long term (current) drug therapy: Secondary | ICD-10-CM

## 2022-10-03 DIAGNOSIS — Z719 Counseling, unspecified: Secondary | ICD-10-CM

## 2022-10-03 DIAGNOSIS — F332 Major depressive disorder, recurrent severe without psychotic features: Secondary | ICD-10-CM

## 2022-10-03 DIAGNOSIS — R278 Other lack of coordination: Secondary | ICD-10-CM

## 2022-10-03 MED ORDER — ARIPIPRAZOLE 10 MG PO TABS: 10.0000 mg | ORAL_TABLET | Freq: Every day | ORAL | 0 refills | Status: DC

## 2022-10-03 MED ORDER — SERTRALINE HCL 100 MG PO TABS: ORAL_TABLET | ORAL | 0 refills | Status: DC

## 2022-10-03 MED ORDER — DYANAVEL XR 10 MG PO CHER: 10.0000 mg | CHEWABLE_EXTENDED_RELEASE_TABLET | Freq: Every day | ORAL | 0 refills | Status: DC

## 2022-10-03 NOTE — Progress Notes (Unsigned)
[facility] [address] [place] [place] [address] Dept: [phone] Dept Fax: [phone]  Medication Check visit via Virtual Video   Patient ID:  Michael [name]  male DOB: 07-Mar-1991   31 y.o.   MRN: [id number]   DATE:10/03/22  PCP: Georgann Housekeeper, MD  Virtual Visit via Video Note  I connected with  Michael [name] on 10/03/22 at 11:00 AM EST by a video enabled telemedicine application and verified that I am speaking with the correct person using two identifiers. Patient/Parent Location: at home  I discussed the limitations, risks, security and privacy concerns of performing an evaluation and management service by telephone and the availability of in person appointments. I also discussed with the parents that there may be a patient responsible charge related to this service. The parents expressed understanding and agreed to proceed.  Provider: Carron Curie, NP  Location: private work location  HPI/CURRENT STATUS: Michael [name] is here for medication management of the psychoactive medications for ADHD and review of educational and behavioral concerns.   Michael [name] currently taking the same medication regimen with effiacy for most of her symptoms. Takes medication as directed daily. Medication tends to wear off around evening time for the Dyanavel. Michael [name] is able to focus through school & homework when she has her medication.   Michael [name] is eating well (eating breakfast, lunch and dinner). Michael [name] does not have appetite suppression, but eating less related to mood changes.   Sleeping well (getting plenty of sleep), sleeping through the night. Michael [name] does not have delayed sleep onset and more sleeping and laying around due to changes in mood.   EDUCATION: School: [employer] Year/Grade:  graduate school   Performance/ Grades:  C in 1 class and did not pass the other class Services: Other: Disability services  Activities/  Exercise:  walking on campus and   MEDICAL HISTORY: Individual Medical History/ Review of Systems: Yes, more issues recently with depressed mood. Has to f/u with endocrinology related to hormone maintenance. Has been healthy with no visits to the PCP. WCC due yearly.   Family Medical/ Social History: None Michael [name] Lives with: parents  MENTAL HEALTH: Mental Health Issues:   Depression and Anxiety  with Zoloft 150 mg daily with some symptom control but could be better.   Allergies: Allergies  Allergen Reactions   Shellfish Allergy    Current Medications:  Current Outpatient Medications  Medication Instructions   ARIPiprazole (ABILIFY) 10 mg, Oral, Daily   busPIRone (BUSPAR) 30 mg, Oral, 2 times daily   Dyanavel XR 10 mg, Oral, Daily   estradiol (ESTRACE) 6 mg, Oral, Daily   finasteride (PROSCAR) 5 MG tablet TAKE 1 TABLET BY MOUTH EVERY DAY   sertraline (ZOLOFT) 100 MG tablet TAKE 2 TABLETS BY MOUTH EVERY DAY   spironolactone (ALDACTONE) 50 mg, Oral, Daily   +Medication Side Effects: None  DIAGNOSES:    ICD-10-CM   1. ADHD (attention deficit hyperactivity disorder), combined type  F90.2     2. Generalized anxiety disorder  F41.1     3. Dysgraphia  R27.8     4. Male-to-male transgender person  Z58.9     5. Severe episode of recurrent major depressive disorder, without psychotic features (HCC)  F33.2     6. Autistic spectrum disorder  F84.0     7. Medication management  Z79.899     8. Patient counseled  Z71.9     9. Goals of care, counseling/discussion  Z71.89  ASSESSMENT:    Michael [name] is a transgender male with a hsitory of ADHD, L/D, Dysgraphia, Anxiety and Depression. Has continued on Zoloft 150 mg, Dyanavel XR 10 mg, and Abilify 10 mg, and Buspar 30 mg daily with some efficacy for symptom control. It has been reported by [facility] that she is having more depressive symptoms that have progressed over th past semester. Academically did not do well in 1 of 2 of the  graduate classes and needing to reapply for next semester. Eating slightly less due to mood changes. Less activity and more laying around. Not currently in counseling. Has to schedule with Endocrinology and not changes with hormones in the past several months. Medication changes for symptom control will be discussed with Michael [name].   PLAN/RECOMMENDATIONS:  Updates with academics and difficulties with his classes last semester.   Unable to focus and had limited motivation with completion of his work.  More depressed feelings starting during the semester that has progressively worsened.  Not currently attending counseling and recommended contacting for f/u visit.   Academically did not do well last semester off of his medication due to limited availability and needs a letter for school.   Endocrinology for routine care related to hormone management with encouragement to f/u for scheduled visit.   Sleep habits and changes with mood discussed related to depressed feelings.  Medication management and symptom control discussed with patient with plan for treatment.   Discussed transfer of care to psychiatry for continued medication management. Referral to be sent.   Counseled medication pharmacokinetics, options, dosage, administration, desired effects, and possible side effects.   Zoloft 100 mg daily, 2 daily, #180 with no RF's (Lexapro could be an options) Abilify 10 mg daily, #90 with no RF's Buspar 30 mg daily, No Rx  Dyanavel 10 mg daily, #30 with no RF's .RX for above e-scribed and sent to pharmacy on record  [phone] [place], [place] - [address] [place] [address] Phone: [phone] Fax: [phone]  [facility] [address], [place] - [address] [place] [address] Phone: [phone] Fax: [phone]  REVIEW OF CHART, FACE TO FACE CLINIC  TIME AND DOCUMENTATION TIME DURING TODAY'S VISIT:  30 mins      NEXT APPOINTMENT:  12/25/2022-f/u visit Telehealth OK  The patient was advised to call back or seek an in-person evaluation if the symptoms worsen or if the condition fails to improve as anticipated.   Carron Curie, NP

## 2022-10-04 ENCOUNTER — Encounter: Payer: Self-pay | Admitting: Family

## 2022-10-10 ENCOUNTER — Ambulatory Visit (INDEPENDENT_AMBULATORY_CARE_PROVIDER_SITE_OTHER): Payer: Self-pay | Admitting: Internal Medicine

## 2022-10-10 ENCOUNTER — Other Ambulatory Visit: Payer: Self-pay | Admitting: Family

## 2022-10-10 ENCOUNTER — Encounter: Payer: Self-pay | Admitting: Internal Medicine

## 2022-10-10 VITALS — BP 124/80 | HR 101 | Ht 65.75 in | Wt 127.0 lb

## 2022-10-10 DIAGNOSIS — F649 Gender identity disorder, unspecified: Secondary | ICD-10-CM

## 2022-10-10 DIAGNOSIS — Z789 Other specified health status: Secondary | ICD-10-CM

## 2022-10-10 LAB — TESTOSTERONE: Testosterone: 182.64 ng/dL — ABNORMAL LOW (ref 300.00–890.00)

## 2022-10-10 MED ORDER — ESTRADIOL 2 MG PO TABS: 6.0000 mg | ORAL_TABLET | Freq: Every day | ORAL | 3 refills | Status: DC

## 2022-10-10 MED ORDER — SPIRONOLACTONE 100 MG PO TABS: 100.0000 mg | ORAL_TABLET | Freq: Every day | ORAL | 3 refills | Status: DC

## 2022-10-10 NOTE — Telephone Encounter (Signed)
Sent new Rx to different pharmacy per patient's request. Abilify 10 mg daily, #90 with no RF's.RX for above e-scribed and sent to pharmacy on record  [phone], [place] - [address] [place] [address] Phone: [phone] Fax: [phone]

## 2022-10-10 NOTE — Progress Notes (Unsigned)
Name: Michael [name]  MRN/ DOB: [id number], [birth year]    Age/ Sex: 31 y.o., adult     PCP: Georgann Housekeeper, MD   Reason for Endocrinology Evaluation: Gender dysphoria      Initial Endocrinology Clinic Visit: 08/31/2019    PATIENT IDENTIFIER: Michael [name] is a 31 y.o., adult with a past medical history of gender dysphoria and ADHD. She has followed with [facility] since 08/30/2021 for consultative assistance with management of her Gender dysphoria .   HISTORICAL SUMMARY:    Pt is referred by for Dr Donette Larry, for rx of transgender state (M to F).  Pt reports she first suspected the transgender state at age 20.  She had puberty at the age of75.  She has never had surgery or medication for this.  He sees a Veterinary surgeon at [facility].  She has no children.  She has no h/o DVT, dyslipidemia, liver disease, kidney disease, heart disease, CVA, diabetes, smoking, gallbladder disease, blood disorders, osteoporosis, or cancer.  Pt started gender affirming hormonal treatment in 2020  SUBJECTIVE:    Today (10/10/2022):  Michael [name] is here for gender dysphoria .   He continues with facial  hair growth  Stable breast tissue  Denies headaches  No Fh of breast cancer or VTE  Denies acne  Denies dizziness   Medication  Estradiol 2 mg 3 tabs daily  Spironolactone 50 mg daily   HISTORY:  Past Medical History:  Past Medical History:  Diagnosis Date   ADHD (attention deficit hyperactivity disorder)    Anxiety    Autism    Past Surgical History: No past surgical history on file. Social History:  reports that she has never smoked. She has never used smokeless tobacco. No history on file for alcohol use and drug use. Family History:  Family History  Problem Relation Age of Onset   Asthma Mother    Depression Mother    Learning disabilities Mother    ADD / ADHD Father    Other Father        low testosterone   Speech disorder Brother      HOME  MEDICATIONS: Allergies as of 10/10/2022       Reactions   Shellfish Allergy         Medication List        Accurate as of October 10, 2022  1:55 PM. If you have any questions, ask your nurse or doctor.          STOP taking these medications    finasteride 5 MG tablet Commonly known as: PROSCAR Stopped by: Scarlette Shorts, MD       TAKE these medications    ARIPiprazole 10 MG tablet Commonly known as: ABILIFY TAKE 1 TABLET BY MOUTH EVERY DAY   busPIRone 30 MG tablet Commonly known as: BUSPAR Take 1 tablet (30 mg total) by mouth 2 (two) times daily.   Dyanavel XR 10 MG Cher Generic drug: Amphetamine ER Take 10 mg by mouth daily.   estradiol 2 MG tablet Commonly known as: ESTRACE Take 3 tablets (6 mg total) by mouth daily.   sertraline 100 MG tablet Commonly known as: ZOLOFT TAKE 2 TABLETS BY MOUTH EVERY DAY   spironolactone 100 MG tablet Commonly known as: ALDACTONE Take 1 tablet (100 mg total) by mouth daily. What changed:  medication strength how much to take Changed by: Scarlette Shorts, MD          OBJECTIVE:   PHYSICAL  EXAM: VS: BP 124/80 (BP Location: Left Arm, Patient Position: Sitting, Cuff Size: Small)   Pulse (!) 101   Ht 5' 5.75" (1.67 m)   Wt 127 lb (57.6 kg)   SpO2 99%   BMI 20.65 kg/m   EXAM: General: Pt appears well and is in NAD  Neck: General: Supple without adenopathy. Thyroid: Thyroid size normal.  No goiter or nodules appreciated.  Lungs: Clear with good BS bilat with no rales, rhonchi, or wheezes  Heart: Auscultation: RRR.  Abdomen: Normoactive bowel sounds, soft, nontender, without masses or organomegaly palpable  Extremities:  BL LE: No pretibial edema normal ROM and strength.  Mental Status: Judgment, insight: Intact Orientation: Oriented to time, place, and person Mood and affect: No depression, anxiety, or agitation     DATA REVIEWED:  ***    Latest Reference Range & Units 04/10/22 14:07   Sodium 135 - 145 mEq/L 139  Potassium 3.5 - 5.1 mEq/L 4.2  Chloride 96 - 112 mEq/L 104  CO2 19 - 32 mEq/L 26  Glucose 70 - 99 mg/dL 104 (H)  BUN 6 - 23 mg/dL 10  Creatinine 0.40 - 1.50 mg/dL 0.72  Calcium 8.4 - 10.5 mg/dL 9.9  GFR >60.00 mL/min 123.97  Total CHOL/HDL Ratio  3  Cholesterol 0 - 200 mg/dL 173  HDL Cholesterol >39.00 mg/dL 58.20  LDL (calc) 0 - 99 mg/dL 91  NonHDL  114.75  Triglycerides 0.0 - 149.0 mg/dL 118.0  VLDL 0.0 - 40.0 mg/dL 23.6  WBC 4.0 - 10.5 K/uL 9.7  RBC 4.22 - 5.81 Mil/uL 4.59  Hemoglobin 13.0 - 17.0 g/dL 13.2  HCT 39.0 - 52.0 % 39.2  MCV 78.0 - 100.0 fl 85.3  MCHC 30.0 - 36.0 g/dL 33.8  RDW 11.5 - 15.5 % 13.5  Platelets 150.0 - 400.0 K/uL 247.0     Latest Reference Range & Units 04/10/22 14:07  Prolactin 2.0 - 18.0 ng/mL 10.1  Glucose 70 - 99 mg/dL 104 (H)  Estradiol < OR = 39 pg/mL 145 (H)  Testosterone 300.00 - 890.00 ng/dL 32.92 (L)    ASSESSMENT / PLAN / RECOMMENDATIONS:   Gender Dysphoria :   -Male to male transition -Tolerating hormone affirming therapy without side effects - But he continues with hair growth, we discussed increasing spironolactone and stopping finasteride  - -Estradiol is at goal < 200 PG/mL -Testosterone level is at goal <  50 mg/DL   Medications   Continue estradiol 2 mg, 3 tabs daily Increase  spironolactone 100 mg daily   Signed electronically by: Mack Guise, MD  [facility]  [address] [place], [place] [address] Phone: [phone] FAX: [phone]      CC: Wenda Low, MD [address] [place] [address] Phone: [phone]  Fax: [phone]   Return to Endocrinology clinic as below: No future appointments.

## 2022-10-11 LAB — ESTRADIOL: Estradiol: 126 pg/mL — ABNORMAL HIGH (ref <=39)

## 2022-10-14 ENCOUNTER — Encounter: Payer: Self-pay | Admitting: Internal Medicine

## 2022-12-25 ENCOUNTER — Institutional Professional Consult (permissible substitution): Payer: BC Managed Care – PPO | Admitting: Family
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
## 2023-01-05 ENCOUNTER — Ambulatory Visit (INDEPENDENT_AMBULATORY_CARE_PROVIDER_SITE_OTHER): Payer: BC Managed Care – PPO | Admitting: Pediatrics

## 2023-01-05 ENCOUNTER — Encounter (INDEPENDENT_AMBULATORY_CARE_PROVIDER_SITE_OTHER): Payer: Self-pay | Admitting: Pediatrics

## 2023-01-05 VITALS — BP 102/70 | HR 94 | Ht 65.35 in | Wt 129.0 lb

## 2023-01-05 DIAGNOSIS — F333 Major depressive disorder, recurrent, severe with psychotic symptoms: Secondary | ICD-10-CM

## 2023-01-05 DIAGNOSIS — F909 Attention-deficit hyperactivity disorder, unspecified type: Secondary | ICD-10-CM | POA: Diagnosis not present

## 2023-01-05 DIAGNOSIS — R278 Other lack of coordination: Secondary | ICD-10-CM

## 2023-01-05 DIAGNOSIS — F411 Generalized anxiety disorder: Secondary | ICD-10-CM

## 2023-01-05 DIAGNOSIS — F32A Depression, unspecified: Secondary | ICD-10-CM

## 2023-01-05 DIAGNOSIS — F840 Autistic disorder: Secondary | ICD-10-CM

## 2023-01-05 DIAGNOSIS — R440 Auditory hallucinations: Secondary | ICD-10-CM

## 2023-01-05 DIAGNOSIS — F902 Attention-deficit hyperactivity disorder, combined type: Secondary | ICD-10-CM

## 2023-01-05 DIAGNOSIS — F419 Anxiety disorder, unspecified: Secondary | ICD-10-CM | POA: Diagnosis not present

## 2023-01-05 DIAGNOSIS — F4310 Post-traumatic stress disorder, unspecified: Secondary | ICD-10-CM

## 2023-01-05 MED ORDER — DYANAVEL XR 10 MG PO CHER: 10.0000 mg | CHEWABLE_EXTENDED_RELEASE_TABLET | Freq: Every day | ORAL | 0 refills | Status: DC

## 2023-01-05 MED ORDER — SERTRALINE HCL 100 MG PO TABS: ORAL_TABLET | ORAL | 1 refills | Status: DC

## 2023-01-05 MED ORDER — BUSPIRONE HCL 30 MG PO TABS: 30.0000 mg | ORAL_TABLET | Freq: Two times a day (BID) | ORAL | 1 refills | Status: DC

## 2023-01-05 MED ORDER — ARIPIPRAZOLE 20 MG PO TABS: 20.0000 mg | ORAL_TABLET | Freq: Every day | ORAL | 1 refills | Status: DC

## 2023-01-05 NOTE — Patient Instructions (Addendum)
We are refilling all medications today, including an increased dose of Abilify.  Abilify, Buspar and Zoloft are being sent in 90 day supplies with 1 refill, so 6 months worth.  Dyanaval can only be sent 1 month at a time, but I have sent prescriptions for the next 3 months.  Ref[facility] for [facility] and [place] have been sent today for ongoing psychiatric care.  I strongly recommend continuing intensive counseling. An mental health on demand line has been set up for patients with [employer].  Information was provided today for this service. A letter was written today explaining the situation and requesting Michael [name]'s classes be removed from his transcript given the situation.   Michael [name] will need an appointment with me in 3 months to continue Dyanaval.  This can be virtual and can be set up today.    Service and Care Concerns  For any complaint or complements within the [facility] system, please go the the website below Website - [email or web address]  You may also email  directly at [email or web address] Or you may call the Patient Relations and Grievance Department at [phone]. To ensure we can return your call, please ensure your voicemail is active and can accept messages. Please provide the patient's name, date of birth, and phone number. Our Patient Relations and Grievance Department operates Monday through Friday between 8 AM and 5 PM. They work with the appropriate leaders to investigate your concerns. Depending on the location of your care, either the site leader or the Patient Relations team will be responsible for communicating the outcome of the investigation with you. Our goal is to complete each investigation within seven business days. If the process exceeds seven days, regular updates will be provided.  You also may file a grievance with the [employer] or the [employer].

## 2023-01-06 ENCOUNTER — Telehealth (INDEPENDENT_AMBULATORY_CARE_PROVIDER_SITE_OTHER): Payer: Self-pay | Admitting: Pediatrics

## 2023-01-06 MED ORDER — ARIPIPRAZOLE 20 MG PO TABS: 20.0000 mg | ORAL_TABLET | Freq: Every day | ORAL | 1 refills | Status: DC

## 2023-01-06 MED ORDER — DYANAVEL XR 10 MG PO CHER: 10.0000 mg | CHEWABLE_EXTENDED_RELEASE_TABLET | Freq: Every day | ORAL | 0 refills | Status: DC

## 2023-01-06 MED ORDER — BUSPIRONE HCL 30 MG PO TABS: 30.0000 mg | ORAL_TABLET | Freq: Two times a day (BID) | ORAL | 1 refills | Status: DC

## 2023-01-06 MED ORDER — SERTRALINE HCL 100 MG PO TABS: ORAL_TABLET | ORAL | 1 refills | Status: DC

## 2023-01-06 NOTE — Telephone Encounter (Signed)
  Name of who is calling: [name]  Caller's Relationship to [place]   Best contact number:[phone]   Provider they see: Dr. Rogers Blocker   Reason for call:Patient called the nurse line stating that they were recently seen at the office and they were prescribed medication but the insurance needs to be changed to the other name in order for it to be covered by insurance. Ladonta is what th prescription should say instead for it to be covered.     PRESCRIPTION REFILL ONLY  Name of prescription:  Pharmacy:

## 2023-01-06 NOTE — Telephone Encounter (Signed)
Contacted patient to inform her that the prescriptions were sent with the other name. Informed her to contact the pharmacy to get the medication filled.   Patient verbalized understanding of this.   SS, CCMA

## 2023-01-06 NOTE — Telephone Encounter (Signed)
I changed the patient's name in the chart and resent the prescriptions.    Michael [name]

## 2023-01-14 ENCOUNTER — Other Ambulatory Visit (INDEPENDENT_AMBULATORY_CARE_PROVIDER_SITE_OTHER): Payer: Self-pay | Admitting: Pediatrics

## 2023-01-15 MED ORDER — DYANAVEL XR 10 MG PO CHER: 10.0000 mg | CHEWABLE_EXTENDED_RELEASE_TABLET | Freq: Every day | ORAL | 0 refills | Status: DC

## 2023-01-19 ENCOUNTER — Encounter (INDEPENDENT_AMBULATORY_CARE_PROVIDER_SITE_OTHER): Payer: Self-pay | Admitting: Pediatrics

## 2023-01-21 ENCOUNTER — Telehealth (INDEPENDENT_AMBULATORY_CARE_PROVIDER_SITE_OTHER): Payer: Self-pay | Admitting: Pediatrics

## 2023-01-21 NOTE — Telephone Encounter (Signed)
Who's calling (name and relationship to patient) : [name]- self   Best contact number:[phone]   Provider they see: Dr Artis Flock   Reason for call: Ayesha Rumpf called in stating she needed a medication change. Amphetamine (DYANAVEL)  needs to be change to something more affordable maybe the generic  brand    Call ID:      PRESCRIPTION REFILL ONLY  Name of prescription:  Pharmacy: [facility] [address]- [address] [place], [place] [address]

## 2023-01-25 MED ORDER — AMPHETAMINE-DEXTROAMPHET ER 10 MG PO CP24: 10.0000 mg | ORAL_CAPSULE | Freq: Every day | ORAL | 0 refills | Status: DC

## 2023-01-25 NOTE — Telephone Encounter (Signed)
I have sent a prescription for Adderall XR 10mg  for 1 month to [facility] in [place].  This is the general version of Dynavel. Many ADHD medications can be hard to find right now, so if they do not have it in stock, he may have to call around and we can send it to another pharmacy.   Dynavel also has a savings plan through the manufacturer.  He can find the information at:  https://www.trisadhd.com/savings-and-support/  Once he figures out which one works best for him, call us back if he needs further refills.   Lorenz Coaster MD MPH

## 2023-01-26 ENCOUNTER — Encounter (INDEPENDENT_AMBULATORY_CARE_PROVIDER_SITE_OTHER): Payer: Self-pay

## 2023-01-26 NOTE — Telephone Encounter (Signed)
Contacted patient.  Verified patients name and DOB.  I relayed the previous message from the provider patient verbalized understanding of this.   The link to the savings plan for the manufacturer was sent to the patient via MyChart.   SS, CCMA

## 2023-03-18 NOTE — Progress Notes (Addendum)
See provider note from same day 

## 2023-03-23 ENCOUNTER — Ambulatory Visit (INDEPENDENT_AMBULATORY_CARE_PROVIDER_SITE_OTHER): Payer: BC Managed Care – PPO | Admitting: Pediatrics

## 2023-03-23 ENCOUNTER — Encounter (INDEPENDENT_AMBULATORY_CARE_PROVIDER_SITE_OTHER): Payer: Self-pay | Admitting: Pediatrics

## 2023-03-23 VITALS — BP 118/70 | HR 80 | Ht 64.96 in | Wt 137.2 lb

## 2023-03-23 DIAGNOSIS — F411 Generalized anxiety disorder: Secondary | ICD-10-CM

## 2023-03-23 DIAGNOSIS — F4310 Post-traumatic stress disorder, unspecified: Secondary | ICD-10-CM

## 2023-03-23 DIAGNOSIS — F840 Autistic disorder: Secondary | ICD-10-CM | POA: Diagnosis not present

## 2023-03-23 DIAGNOSIS — F333 Major depressive disorder, recurrent, severe with psychotic symptoms: Secondary | ICD-10-CM

## 2023-03-23 DIAGNOSIS — F902 Attention-deficit hyperactivity disorder, combined type: Secondary | ICD-10-CM

## 2023-03-23 MED ORDER — AMPHETAMINE-DEXTROAMPHET ER 10 MG PO CP24: 10.0000 mg | ORAL_CAPSULE | ORAL | 0 refills | Status: DC

## 2023-03-23 NOTE — Patient Instructions (Addendum)
I sent three separate prescriptions for Adderall, each for one month supply. So there wont be refills on the new RX but there will be a new one entirely.  I refilled Abilify, Buspar, and Zoloft last time.  Continue with DBT and skills class.  I will keep working on getting you into seeing a psychiatrist.

## 2023-03-29 ENCOUNTER — Encounter (INDEPENDENT_AMBULATORY_CARE_PROVIDER_SITE_OTHER): Payer: Self-pay | Admitting: Pediatrics

## 2023-03-29 NOTE — Progress Notes (Signed)
Patient: Michael [name] MRN: [id number] Sex: adult DOB: 1990-10-21  Provider: Lorenz Coaster, MD Location of Care: [facility]  Note type: Routine follow-up  History of Present Illness:  Michael [name] is a 32 y.o. transgender male with a history of ADHD, L/D, Dysgraphia, Anxiety and depression who I am seeing for routine follow-up. Patient was last seen on 01/05/23 where I increased abilify, refilled buspar, zoloft, and dyanaval, referred for pyschiatry, and provided school letter.  Since the last appointment, there are no relevant visits noted in the patients chart.    Patient presents today on her own. She reports that she is able to get the Adderall, she reports that this has been working better than her Dyanaval. Able to focus well at school. It does wear off in the evenings, but she is okay with this because she is able to sleep well at night and eat more at night.   Mood is improving, continuing with DBT 1x weekly and has skills class 1 x weekly. Michael [name] with [facility]. Feels mood is improving. Still having some anxiety attacks, about once weekly. No known triggers. She reports that she is feeling much better about this.   Classes were dropped from last semester and this most recent one. Plan to extend school over the next few years. This summer, plan to work on improving mental health.   Screenings:    03/23/2023   12:00 PM 01/06/2023   10:00 AM  PHQ-SADS Score Only  PHQ-15 2 8   GAD-7 3 16   Anxiety attacks Yes Yes  PHQ-9 3 20   Suicidal Ideation No Yes  Any difficulty to complete tasks? Somewhat difficult Extremely difficult     Past Medical History Past Medical History:  Diagnosis Date   ADHD (attention deficit hyperactivity disorder)    Anxiety    Autism     Surgical History History reviewed. No pertinent surgical history.  Family History family history includes ADD / ADHD in her father; Asthma in  her mother; Depression in her mother; Learning disabilities in her mother; Other in her father; Speech disorder in her brother.   Social History Social History   Social History Narrative   Michael [name] is a 32 year old Transgender male.    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    She lives with her parents and younger brother.     Allergies Allergies  Allergen Reactions   Shellfish Allergy     Medications Current Outpatient Medications on File Prior to Visit  Medication Sig Dispense Refill   ARIPiprazole (ABILIFY) 20 MG tablet Take 1 tablet (20 mg total) by mouth daily. 90 tablet 1   busPIRone (BUSPAR) 30 MG tablet Take 1 tablet (30 mg total) by mouth 2 (two) times daily. 180 tablet 1   estradiol (ESTRACE) 2 MG tablet Take 3 tablets (6 mg total) by mouth daily. 270 tablet 3   sertraline (ZOLOFT) 100 MG tablet TAKE 2 TABLETS BY MOUTH EVERY DAY 180 tablet 1   spironolactone (ALDACTONE) 100 MG tablet Take 1 tablet (100 mg total) by mouth daily. 90 tablet 3   No current facility-administered medications on file prior to visit.   The medication list was reviewed and reconciled. All changes or newly prescribed medications were explained.  A complete medication list was provided to the patient/caregiver.  Physical Exam BP 118/70 (BP Location: Right Arm, Patient Position: Sitting, Cuff Size: Normal)   Pulse 80   Ht 5' 4.96" (1.65 m)  Wt 137 lb 3.2 oz (62.2 kg)   BMI 22.86 kg/m  Facility age limit for growth %iles is 20 years.  No results found. General: NAD, well nourished  HEENT: normocephalic, no eye or nose discharge.  MMM  Cardiovascular: warm and well perfused Lungs: Normal work of breathing, no rhonchi or stridor Skin: No birthmarks, no skin breakdown Abdomen: soft, non tender, non distended Extremities: No contractures or edema. Neuro: EOM intact, face symmetric. Moves all extremities equally and at least antigravity. No abnormal movements. Normal gait.    Diagnosis: 1.  Generalized anxiety disorder   2. ADHD (attention deficit hyperactivity disorder), combined type   3. MDD (major depressive disorder), recurrent, severe, with psychosis (HCC)   4. Autistic spectrum disorder   5. PTSD (post-traumatic stress disorder)      Assessment and Plan Michael [name] is a 32 y.o. transgender male with a history of ADHD, L/D, Dysgraphia, Anxiety and depression who I am seeing in follow-up. Mood and ADHD symptoms are significantly improved since the last visit. Patient feels continued therapy has improved her mood, recommend continuing on this and will refill SSRI, Buspar, and Abilify to continue to manage mood and panic attacks. ADHD symptoms are well controlled on Adderall, refilled this today. To further manage medications, recommend establishing with adult psychiatry, referral placed today.   - Refilled Adderall  - Continue Abilify, Buspar, and Zoloft  - Referral to adult psychiatry  Return in about 3 months (around 06/23/2023).  I, Mayra Reel, scribed for and in the presence of Lorenz Coaster, MD at today's visit on 03/23/2023.  I, Lorenz Coaster MD MPH, personally performed the services described in this documentation, as scribed by Mayra Reel in my presence on 03/23/2023 and it is accurate, complete, and reviewed by me.    Lorenz Coaster MD MPH Neurology and Neurodevelopment [facility]  [address] [place], [place], [place] [address] Phone: [phone] Fax: [phone]

## 2023-04-08 ENCOUNTER — Encounter (INDEPENDENT_AMBULATORY_CARE_PROVIDER_SITE_OTHER): Payer: Self-pay

## 2023-05-29 ENCOUNTER — Other Ambulatory Visit (INDEPENDENT_AMBULATORY_CARE_PROVIDER_SITE_OTHER): Payer: Self-pay | Admitting: Pediatrics

## 2023-06-18 ENCOUNTER — Ambulatory Visit (INDEPENDENT_AMBULATORY_CARE_PROVIDER_SITE_OTHER): Payer: BC Managed Care – PPO | Admitting: Pediatrics

## 2023-07-05 ENCOUNTER — Other Ambulatory Visit (INDEPENDENT_AMBULATORY_CARE_PROVIDER_SITE_OTHER): Payer: Self-pay | Admitting: Pediatrics

## 2023-07-09 ENCOUNTER — Encounter (INDEPENDENT_AMBULATORY_CARE_PROVIDER_SITE_OTHER): Payer: Self-pay

## 2023-07-09 ENCOUNTER — Other Ambulatory Visit (INDEPENDENT_AMBULATORY_CARE_PROVIDER_SITE_OTHER): Payer: Self-pay | Admitting: Pediatrics

## 2023-07-09 NOTE — Telephone Encounter (Signed)
PatienT to schedule follow up appointment.  MyChart message sent.  SS, CCMA

## 2023-07-17 ENCOUNTER — Other Ambulatory Visit: Payer: Self-pay | Admitting: Internal Medicine

## 2023-07-17 DIAGNOSIS — Z789 Other specified health status: Secondary | ICD-10-CM

## 2023-07-23 ENCOUNTER — Ambulatory Visit (INDEPENDENT_AMBULATORY_CARE_PROVIDER_SITE_OTHER): Payer: BC Managed Care – PPO | Admitting: Pediatrics

## 2023-08-06 ENCOUNTER — Ambulatory Visit (INDEPENDENT_AMBULATORY_CARE_PROVIDER_SITE_OTHER): Payer: BC Managed Care – PPO | Admitting: Pediatrics

## 2023-09-29 ENCOUNTER — Telehealth: Payer: Self-pay | Admitting: Internal Medicine

## 2023-09-29 NOTE — Telephone Encounter (Signed)
Patient asking if they can chang to Injections and stop taking tablets.

## 2023-09-30 NOTE — Telephone Encounter (Signed)
Patient rescheduled 10/20/23 appointment to 10/02/2023.

## 2023-09-30 NOTE — Telephone Encounter (Signed)
Attempted to contact patient call not going through

## 2023-10-02 ENCOUNTER — Encounter: Payer: Self-pay | Admitting: Internal Medicine

## 2023-10-02 ENCOUNTER — Ambulatory Visit (INDEPENDENT_AMBULATORY_CARE_PROVIDER_SITE_OTHER): Payer: BC Managed Care – PPO | Admitting: Internal Medicine

## 2023-10-02 VITALS — BP 120/60 | HR 112 | Resp 20 | Ht 64.96 in | Wt 148.8 lb

## 2023-10-02 DIAGNOSIS — Z789 Other specified health status: Secondary | ICD-10-CM

## 2023-10-02 DIAGNOSIS — D509 Iron deficiency anemia, unspecified: Secondary | ICD-10-CM

## 2023-10-02 MED ORDER — BD DISP NEEDLES 22G X 1-1/2" MISC: 1.0000 | 2 refills | Status: AC

## 2023-10-02 MED ORDER — SYRINGE/NEEDLE (DISP) 18G X 1" 3 ML MISC: 1.0000 | 2 refills | Status: AC

## 2023-10-02 MED ORDER — ESTRADIOL VALERATE 20 MG/ML IM OIL: 20.0000 mg | TOPICAL_OIL | INTRAMUSCULAR | 2 refills | Status: DC

## 2023-10-02 NOTE — Patient Instructions (Signed)
Take estrogen injections 1 mL once monthly Continue spironolactone 100 mg daily

## 2023-10-02 NOTE — Progress Notes (Unsigned)
Name: Michael [name]  MRN/ DOB: [id number], 10-Dec-1990    Age/ Sex: 32 y.o., adult     PCP: Georgann Housekeeper, MD   Reason for Endocrinology Evaluation: Gender dysphoria      Initial Endocrinology Clinic Visit: 08/31/2019    PATIENT IDENTIFIER: Michael [name] is a 33 y.o., adult with a past medical history of gender dysphoria and ADHD. She has followed with [facility] since 08/30/2021 for consultative assistance with management of her Gender dysphoria .   HISTORICAL SUMMARY:    Pt is referred by for Dr Donette Larry, for rx of transgender state (M to F).  Pt reports she first suspected the transgender state at age 73.  She had puberty at the age of73.  She has never had surgery or medication for this.  He sees a Veterinary surgeon at [facility].  She has no children.  She has no h/o DVT, dyslipidemia, liver disease, kidney disease, heart disease, CVA, diabetes, smoking, gallbladder disease, blood disorders, osteoporosis, or cancer.  Pt started gender affirming hormonal treatment in 2020  SUBJECTIVE:    Today (10/02/2023):  Michael [name] is here for gender dysphoria . Patient would like to switch from estradiol tablets to IM injection as she believes will have more biological effect Patient continues with facial hair growth despite laser hair treatment in the past No change in breast size No shortness of breath or chest pain Denies lower extremity edema Has mild acne No nausea or vomiting No constipation or diarrhea    Medication  Estradiol 2 mg 3 tabs daily  Spironolactone 50 mg daily   HISTORY:  Past Medical History:  Past Medical History:  Diagnosis Date   ADHD (attention deficit hyperactivity disorder)    Anxiety    Autism    Past Surgical History: No past surgical history on file. Social History:  reports that she has never smoked. She has never used smokeless tobacco. No history on file for alcohol use and drug use. Family History:  Family History   Problem Relation Age of Onset   Asthma Mother    Depression Mother    Learning disabilities Mother    ADD / ADHD Father    Other Father        low testosterone   Speech disorder Brother      HOME MEDICATIONS: Allergies as of 10/02/2023       Reactions   Shellfish Allergy         Medication List        Accurate as of October 02, 2023  3:32 PM. If you have any questions, ask your nurse or doctor.          STOP taking these medications    estradiol 2 MG tablet Commonly known as: ESTRACE Stopped by: Johnney Ou Spurgeon Gancarz       TAKE these medications    amphetamine-dextroamphetamine 20 MG tablet Commonly known as: ADDERALL Take 20 mg by mouth daily.   amphetamine-dextroamphetamine 10 MG 24 hr capsule Commonly known as: Adderall XR Take 1 capsule (10 mg total) by mouth every morning.   amphetamine-dextroamphetamine 10 MG 24 hr capsule Commonly known as: Adderall XR Take 1 capsule (10 mg total) by mouth every morning.   amphetamine-dextroamphetamine 10 MG 24 hr capsule Commonly known as: Adderall XR Take 1 capsule (10 mg total) by mouth every morning.   ARIPiprazole 20 MG tablet Commonly known as: ABILIFY TAKE 1 TABLET BY MOUTH EVERY DAY   BD Disp Needles 22G X 1-1/2"  Misc Generic drug: NEEDLE (DISP) 22 G 1 Device by Does not apply route every 30 (thirty) days. Started by: Johnney Ou Ladrea Holladay   busPIRone 30 MG tablet Commonly known as: BUSPAR TAKE 1 TABLET BY MOUTH TWICE A DAY   estradiol valerate 20 MG/ML injection Commonly known as: DELESTROGEN Inject 1 mL (20 mg total) into the muscle every 28 (twenty-eight) days. Started by: Johnney Ou Sladen Plancarte   sertraline 100 MG tablet Commonly known as: ZOLOFT TAKE 2 TABLETS BY MOUTH EVERY DAY   spironolactone 100 MG tablet Commonly known as: ALDACTONE TAKE 1 TABLET BY MOUTH EVERY DAY   Syringe/Needle (Disp) 18G X 1" 3 ML Misc 1 Device by Does not apply route every 30 (thirty) days. Started by:  Johnney Ou Sharone Picchi          OBJECTIVE:   PHYSICAL EXAM: VS: BP 120/60 (BP Location: Left Arm, Patient Position: Sitting, Cuff Size: Normal)   Pulse (!) 112   Resp 20   Ht 5' 4.96" (1.65 m)   Wt 148 lb 12.8 oz (67.5 kg)   SpO2 98%   BMI 24.79 kg/m   EXAM: General: Pt appears well and is in NAD  Neck: General: Supple without adenopathy. Thyroid: Thyroid size normal.  No goiter or nodules appreciated.  Lungs: Clear with good BS bilat with no rales, rhonchi, or wheezes  Heart: Auscultation: RRR.  Abdomen: Normoactive bowel sounds, soft, nontender, without masses or organomegaly palpable  Extremities:  BL LE: No pretibial edema normal ROM and strength.  Mental Status: Judgment, insight: Intact Orientation: Oriented to time, place, and person Mood and affect: No depression, anxiety, or agitation     DATA REVIEWED:   Latest Reference Range & Units 04/10/22 14:07  Sodium 135 - 145 mEq/L 139  Potassium 3.5 - 5.1 mEq/L 4.2  Chloride 96 - 112 mEq/L 104  CO2 19 - 32 mEq/L 26  Glucose 70 - 99 mg/dL 161 (H)  BUN 6 - 23 mg/dL 10  Creatinine 0.96 - 0.45 mg/dL 4.09  Calcium 8.4 - 81.1 mg/dL 9.9  GFR >91.47 mL/min 123.97  Total CHOL/HDL Ratio  3  Cholesterol 0 - 200 mg/dL 829  HDL Cholesterol >56.21 mg/dL 30.86  LDL (calc) 0 - 99 mg/dL 91  NonHDL  578.46  Triglycerides 0.0 - 149.0 mg/dL 962.9  VLDL 0.0 - 52.8 mg/dL 41.3  WBC 4.0 - 24.4 K/uL 9.7  RBC 4.22 - 5.81 Mil/uL 4.59  Hemoglobin 13.0 - 17.0 g/dL 01.0  HCT 27.2 - 53.6 % 39.2  MCV 78.0 - 100.0 fl 85.3  MCHC 30.0 - 36.0 g/dL 64.4  RDW 03.4 - 74.2 % 13.5  Platelets 150.0 - 400.0 K/uL 247.0  Prolactin 2.0 - 18.0 ng/mL 10.1    Latest Reference Range & Units 04/10/22 14:07  Estradiol < OR = 39 pg/mL 145 (H)  Testosterone 300.00 - 890.00 ng/dL 59.56 (L)       Latest Reference Range & Units 04/10/22 14:07  Sodium 135 - 145 mEq/L 139  Potassium 3.5 - 5.1 mEq/L 4.2  Chloride 96 - 112 mEq/L 104  CO2 19 - 32 mEq/L  26  Glucose 70 - 99 mg/dL 387 (H)  BUN 6 - 23 mg/dL 10  Creatinine 5.64 - 3.32 mg/dL 9.51  Calcium 8.4 - 88.4 mg/dL 9.9  GFR >16.60 mL/min 123.97  Total CHOL/HDL Ratio  3  Cholesterol 0 - 200 mg/dL 630  HDL Cholesterol >16.01 mg/dL 09.32  LDL (calc) 0 - 99 mg/dL 91  NonHDL  355.73  Triglycerides 0.0 - 149.0 mg/dL 409.8  VLDL 0.0 - 11.9 mg/dL 14.7  WBC 4.0 - 82.9 K/uL 9.7  RBC 4.22 - 5.81 Mil/uL 4.59  Hemoglobin 13.0 - 17.0 g/dL 56.2  HCT 13.0 - 86.5 % 39.2  MCV 78.0 - 100.0 fl 85.3  MCHC 30.0 - 36.0 g/dL 78.4  RDW 69.6 - 29.5 % 13.5  Platelets 150.0 - 400.0 K/uL 247.0     Latest Reference Range & Units 04/10/22 14:07  Prolactin 2.0 - 18.0 ng/mL 10.1  Glucose 70 - 99 mg/dL 284 (H)  Estradiol < OR = 39 pg/mL 145 (H)  Testosterone 300.00 - 890.00 ng/dL 13.24 (L)    ASSESSMENT / PLAN / RECOMMENDATIONS:   Gender Dysphoria :   -Male to male transition -Tolerating hormone affirming therapy without side effects - But he continues with hair growth, and no change in breast size, patient interested in switching to IM estradiol -Patient was trained by my assistant on injection technique -Estradiol goal < 200 PG/mL -Testosterone  goal  <  50 mg/DL   Medications   Stop estradiol 2 mg, 3 tabs daily Start estradiol valerate 20 mg (1 mL) monthly Increase  spironolactone 100 mg daily  Follow-up in 4 months  Signed electronically by: Lyndle Herrlich, MD  [facility]  [facility] [address] [place] [place], [place] [address] Phone: [phone] FAX: [phone]      CC: Georgann Housekeeper, MD [address] [place] [address] Phone: [phone]  Fax: [phone]   Return to Endocrinology clinic as below: No future appointments.

## 2023-10-05 ENCOUNTER — Encounter: Payer: Self-pay | Admitting: Internal Medicine

## 2023-10-07 LAB — CBC
HCT: 37.3 % — ABNORMAL LOW (ref 38.5–50.0)
Hemoglobin: 12.2 g/dL — ABNORMAL LOW (ref 13.2–17.1)
MCH: 27.9 pg (ref 27.0–33.0)
MCHC: 32.7 g/dL (ref 32.0–36.0)
MCV: 85.2 fL (ref 80.0–100.0)
MPV: 9.7 fL (ref 7.5–12.5)
Platelets: 207 10*3/uL (ref 140–400)
RBC: 4.38 10*6/uL (ref 4.20–5.80)
RDW: 12.6 % (ref 11.0–15.0)
WBC: 9.3 10*3/uL (ref 3.8–10.8)

## 2023-10-07 LAB — COMPREHENSIVE METABOLIC PANEL
AG Ratio: 1.9 (calc) (ref 1.0–2.5)
ALT: 28 U/L (ref 9–46)
AST: 22 U/L (ref 10–40)
Albumin: 4.6 g/dL (ref 3.6–5.1)
Alkaline phosphatase (APISO): 71 U/L (ref 36–130)
BUN: 9 mg/dL (ref 7–25)
CO2: 25 mmol/L (ref 20–32)
Calcium: 9.3 mg/dL (ref 8.6–10.3)
Chloride: 105 mmol/L (ref 98–110)
Creat: 0.7 mg/dL (ref 0.60–1.26)
Globulin: 2.4 g/dL (ref 1.9–3.7)
Glucose, Bld: 118 mg/dL — ABNORMAL HIGH (ref 65–99)
Potassium: 4 mmol/L (ref 3.5–5.3)
Sodium: 139 mmol/L (ref 135–146)
Total Bilirubin: 0.2 mg/dL (ref 0.2–1.2)
Total Protein: 7 g/dL (ref 6.1–8.1)

## 2023-10-07 LAB — PROLACTIN: Prolactin: 8.4 ng/mL (ref 2.0–18.0)

## 2023-10-07 LAB — TESTOSTERONE, TOTAL, LC/MS/MS: Testosterone, Total, LC-MS-MS: 18 ng/dL — ABNORMAL LOW (ref 250–1100)

## 2023-10-07 LAB — ESTRADIOL: Estradiol: 173 pg/mL — ABNORMAL HIGH (ref <=39)

## 2023-10-20 ENCOUNTER — Ambulatory Visit: Payer: Self-pay | Admitting: Internal Medicine

## 2023-12-21 ENCOUNTER — Other Ambulatory Visit (INDEPENDENT_AMBULATORY_CARE_PROVIDER_SITE_OTHER): Payer: Self-pay | Admitting: Pediatrics

## 2023-12-24 NOTE — Telephone Encounter (Signed)
 Good afternoon, [name] has been scheduled. Thank you, Asia D.

## 2024-01-17 ENCOUNTER — Other Ambulatory Visit: Payer: Self-pay | Admitting: Internal Medicine

## 2024-01-18 ENCOUNTER — Other Ambulatory Visit (INDEPENDENT_AMBULATORY_CARE_PROVIDER_SITE_OTHER): Payer: Self-pay | Admitting: Pediatrics

## 2024-01-27 ENCOUNTER — Other Ambulatory Visit (INDEPENDENT_AMBULATORY_CARE_PROVIDER_SITE_OTHER): Payer: Self-pay | Admitting: Pediatrics

## 2024-02-01 ENCOUNTER — Encounter: Payer: Self-pay | Admitting: Internal Medicine

## 2024-02-01 ENCOUNTER — Ambulatory Visit (INDEPENDENT_AMBULATORY_CARE_PROVIDER_SITE_OTHER): Payer: BC Managed Care – PPO | Admitting: Internal Medicine

## 2024-02-01 VITALS — BP 108/64 | HR 102 | Resp 16 | Ht 65.0 in | Wt 152.2 lb

## 2024-02-01 DIAGNOSIS — F649 Gender identity disorder, unspecified: Secondary | ICD-10-CM | POA: Diagnosis not present

## 2024-02-01 NOTE — Progress Notes (Signed)
 Name: Michael [name]  MRN/ DOB: [id number], [birth year]    Age/ Sex: 33 y.o., adult     PCP: Jearldine Mina, MD   Reason for Endocrinology Evaluation: Gender dysphoria      Initial Endocrinology Clinic Visit: 08/31/2019    PATIENT IDENTIFIER: Michael [name] is a 33 y.o., adult with a past medical history of gender dysphoria and ADHD. She has followed with [facility] since 08/30/2021 for consultative assistance with management of her Gender dysphoria .   HISTORICAL SUMMARY:    Pt is referred by for Dr Husain, for rx of transgender state (M to F).  Pt reports she first suspected the transgender state at age 77.  She had puberty at the age of71.  She has never had surgery or medication for this.  He sees a Veterinary surgeon at [facility].  She has no children.  She has no h/o DVT, dyslipidemia, liver disease, kidney disease, heart disease, CVA, diabetes, smoking, gallbladder disease, blood disorders, osteoporosis, or cancer.  Pt started gender affirming hormonal treatment in 2020  SUBJECTIVE:    Today (02/01/2024):  Michael [name] is here for gender dysphoria .   Pt has been taking 5 mL every Saturday? Stating she forgot she is supposed to be on   Minimal  changes in facial hair growth , slowing  No chest pain or sob  Denies lower extremity edema Acne  resolved  No nausea or vomiting except when had gastritis last week      Medication  Estradiol   Valerate 20 mg Q 28 days  Spironolactone  100 mg daily   HISTORY:  Past Medical History:  Past Medical History:  Diagnosis Date   ADHD (attention deficit hyperactivity disorder)    Anxiety    Autism    Past Surgical History: No past surgical history on file. Social History:  reports that she has never smoked. She has never used smokeless tobacco. No history on file for alcohol use and drug use. Family History:  Family History  Problem Relation Age of Onset   Asthma Mother    Depression Mother    Learning  disabilities Mother    ADD / ADHD Father    Other Father        low testosterone    Speech disorder Brother      HOME MEDICATIONS: Allergies as of 02/01/2024       Reactions   Shellfish Allergy         Medication List        Accurate as of February 01, 2024  2:24 PM. If you have any questions, ask your nurse or doctor.          amphetamine -dextroamphetamine  20 MG tablet Commonly known as: ADDERALL Take 20 mg by mouth daily.   amphetamine -dextroamphetamine  10 MG 24 hr capsule Commonly known as: Adderall XR Take 1 capsule (10 mg total) by mouth every morning.   amphetamine -dextroamphetamine  10 MG 24 hr capsule Commonly known as: Adderall XR Take 1 capsule (10 mg total) by mouth every morning.   amphetamine -dextroamphetamine  10 MG 24 hr capsule Commonly known as: Adderall XR Take 1 capsule (10 mg total) by mouth every morning.   ARIPiprazole  20 MG tablet Commonly known as: ABILIFY  TAKE 1 TABLET BY MOUTH EVERY DAY   BD Disp Needles 22G X 1-1/2" Misc Generic drug: NEEDLE (DISP) 22 G 1 Device by Does not apply route every 30 (thirty) days.   busPIRone  30 MG tablet Commonly known as: BUSPAR  TAKE 1 TABLET BY  MOUTH TWICE A DAY   estradiol  valerate 20 MG/ML injection Commonly known as: DELESTROGEN  Inject 1 mL (20 mg total) into the muscle every 28 (twenty-eight) days.   sertraline  100 MG tablet Commonly known as: ZOLOFT  TAKE 2 TABLETS BY MOUTH EVERY DAY   spironolactone  100 MG tablet Commonly known as: ALDACTONE  TAKE 1 TABLET BY MOUTH EVERY DAY   Syringe/Needle (Disp) 18G X 1" 3 ML Misc 1 Device by Does not apply route every 30 (thirty) days.          OBJECTIVE:   PHYSICAL EXAM: VS: BP 108/64   Pulse (!) 102   Resp 16   Ht 5\' 5"  (1.651 m)   Wt 152 lb 3.2 oz (69 kg)   SpO2 97%   BMI 25.33 kg/m    EXAM: General: Pt appears well and is in NAD  Neck: General: Supple without adenopathy. Thyroid: Thyroid size normal.  No goiter or nodules  appreciated.  Lungs: Clear with good BS bilat   Heart: Auscultation: RRR.  Abdomen: soft, nontender  Extremities:  BL LE: No pretibial edema   Mental Status: Judgment, insight: Intact Orientation: Oriented to time, place, and person Mood and affect: No depression, anxiety, or agitation     DATA REVIEWED:   Latest Reference Range & Units 02/01/24 14:41  Sodium 135 - 146 mmol/L 140  Potassium 3.5 - 5.3 mmol/L 4.2  Chloride 98 - 110 mmol/L 104  CO2 20 - 32 mmol/L 28  Glucose 65 - 99 mg/dL 409 (H)  BUN 7 - 25 mg/dL 8  Creatinine 8.11 - 9.14 mg/dL 7.82  Calcium 8.6 - 95.6 mg/dL 9.3  BUN/Creatinine Ratio 6 - 22 (calc) SEE NOTE:    Latest Reference Range & Units 02/01/24 14:41  LH 1.5 - 9.3 mIU/mL 0.3 (L)  Glucose 65 - 99 mg/dL 213 (H)  Estradiol  < OR = 39 pg/mL 1,329 (H)      ASSESSMENT / PLAN / RECOMMENDATIONS:   Gender Dysphoria :   -Male to male transition -I have changed the prescription from oral estradiol  to IM injections per her request - Patient claims that she forgot the original instructions of taking 1 mL every 28 days but rather has been taking 5 mL weekly? -I did discuss the risk of overdosing on estrogen, patient to decrease estrogen as below -On the last visit, I had verbally communicated the proper way of using estradiol  valerate, these instructions were written at the after visit summary, and those instructions were also verified with my CMA, I did remind the patient the importance of following provider instructions -I did contact the pharmacy and they verified that the only pickup of estradiol  valerate was on 01/10/2024, no prior pickup from December until then -Estradiol  goal < 200 PG/mL -Testosterone   goal  <  50 mg/DL   Medications    estradiol  valerate 20 mg (1 mL) monthly Continue  spironolactone  100 mg daily   Signed electronically by: Natale Bail, MD  [facility]ocrin[facility]  [facility] [address] [place] [place], [place] [address] Phone: [phone] FAX: [phone]      CC: Jearldine Mina, MD [address] [place] [address] Phone: [phone]  Fax: [phone]   Return to Endocrinology clinic as below: Future Appointments  Date Time Provider Department Center  02/01/2024  2:40 PM Caeli [name], Julian Obey, MD [facility] None  02/18/2024  2:00 PM Lowell Rude, MD [facility]

## 2024-02-01 NOTE — Patient Instructions (Signed)
 Take estrogen injections 1 mL once monthly Continue spironolactone 100 mg daily

## 2024-02-02 ENCOUNTER — Encounter: Payer: Self-pay | Admitting: Internal Medicine

## 2024-02-02 DIAGNOSIS — F649 Gender identity disorder, unspecified: Secondary | ICD-10-CM | POA: Insufficient documentation

## 2024-02-02 MED ORDER — ESTRADIOL VALERATE 20 MG/ML IM OIL: 20.0000 mg | TOPICAL_OIL | INTRAMUSCULAR | 0 refills | Status: AC

## 2024-02-04 LAB — BASIC METABOLIC PANEL WITHOUT GFR
BUN: 8 mg/dL (ref 7–25)
CO2: 28 mmol/L (ref 20–32)
Calcium: 9.3 mg/dL (ref 8.6–10.3)
Chloride: 104 mmol/L (ref 98–110)
Creat: 0.6 mg/dL (ref 0.60–1.26)
Glucose, Bld: 111 mg/dL — ABNORMAL HIGH (ref 65–99)
Potassium: 4.2 mmol/L (ref 3.5–5.3)
Sodium: 140 mmol/L (ref 135–146)

## 2024-02-04 LAB — LUTEINIZING HORMONE: LH: 0.3 m[IU]/mL — ABNORMAL LOW (ref 1.5–9.3)

## 2024-02-04 LAB — ESTRADIOL: Estradiol: 1329 pg/mL — ABNORMAL HIGH (ref <=39)

## 2024-02-04 LAB — TESTOSTERONE, TOTAL, LC/MS/MS: Testosterone, Total, LC-MS-MS: 15 ng/dL — ABNORMAL LOW (ref 250–1100)

## 2024-02-18 ENCOUNTER — Ambulatory Visit (INDEPENDENT_AMBULATORY_CARE_PROVIDER_SITE_OTHER): Admitting: Pediatrics

## 2024-03-19 ENCOUNTER — Other Ambulatory Visit (INDEPENDENT_AMBULATORY_CARE_PROVIDER_SITE_OTHER): Payer: Self-pay | Admitting: Pediatrics

## 2024-04-23 ENCOUNTER — Other Ambulatory Visit: Payer: Self-pay | Admitting: Internal Medicine

## 2024-05-11 NOTE — Progress Notes (Incomplete)
   Patient: TEMITAYO COVALT MRN: [id number] Sex: adult DOB: 1991/06/14  Provider: Corean Geralds, MD Location of Care: [facility] - Child Neurology  Note type: Routine follow-up  History of Present Illness:  ASHLEIGH ARYA is a 33 y.o. transgender male with a history of ADHD, L/D, Dysgraphia, Anxiety and depression who I am seeing for routine follow-up. Patient was last seen on 03/23/2023 where I continued medications and referred to adult psychiatry.  Since the last appointment, patient has had two no show appointments.   Patient presents today with {CHL AMB PARENT/GUARDIAN:[id number]} who reports the following:      Screenings:  Patient History:   Diagnostics:    Past Medical History Past Medical History:  Diagnosis Date   ADHD (attention deficit hyperactivity disorder)    Anxiety    Autism     Surgical History No past surgical history on file.  Family History family history includes ADD / ADHD in her father; Asthma in her mother; Depression in her mother; Learning disabilities in her mother; Other in her father; Speech disorder in her brother.   Social History Social History   Social History Narrative   Vallie is a 33 year old Transgender male.    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    She lives with her parents and younger brother.     Allergies Allergies  Allergen Reactions   Shellfish Allergy     Medications Current Outpatient Medications on File Prior to Visit  Medication Sig Dispense Refill   amphetamine -dextroamphetamine  (ADDERALL) 20 MG tablet Take 20 mg by mouth daily.     ARIPiprazole  (ABILIFY ) 20 MG tablet TAKE 1 TABLET BY MOUTH EVERY DAY 90 tablet 1   busPIRone  (BUSPAR ) 30 MG tablet TAKE 1 TABLET BY MOUTH TWICE A DAY 60 tablet 0   estradiol  valerate (DELESTROGEN ) 20 MG/ML injection Inject 1 mL (20 mg total) into the muscle every 28 (twenty-eight) days. 5 mL 0   NEEDLE, DISP, 22 G (BD DISP NEEDLES) 22G X 1-1/2  MISC 1 Device by Does not apply route every 30 (thirty) days. 10 each 2   sertraline  (ZOLOFT ) 100 MG tablet TAKE 2 TABLETS BY MOUTH EVERY DAY 180 tablet 2   spironolactone  (ALDACTONE ) 100 MG tablet TAKE 1 TABLET BY MOUTH EVERY DAY 90 tablet 0   Syringe/Needle, Disp, 18G X 1 3 ML MISC 1 Device by Does not apply route every 30 (thirty) days. 10 each 2   No current facility-administered medications on file prior to visit.   The medication list was reviewed and reconciled. All changes or newly prescribed medications were explained.  A complete medication list was provided to the patient/caregiver.  Physical Exam There were no vitals taken for this visit. Facility age limit for growth %iles is 20 years.  No results found.  ***   Diagnosis:No diagnosis found.   Assessment and Plan JOHNMARK GEIGER is a 33 y.o. transgender male with a history of ADHD, L/D, Dysgraphia, Anxiety and depression who I am seeing in follow-up.   I spent *** minutes on day of service on this patient including review of chart, discussion with patient and family, discussion of screening results, coordination with other providers and management of orders and paperwork.     No follow-ups on file.  Corean Geralds MD MPH Neurology and Neurodevelopment [facility]  [address] [place], [place], [place] [address] Phone: [phone] Fax: [phone]

## 2024-05-19 ENCOUNTER — Ambulatory Visit (INDEPENDENT_AMBULATORY_CARE_PROVIDER_SITE_OTHER): Admitting: Pediatrics

## 2024-05-26 ENCOUNTER — Ambulatory Visit: Admitting: Internal Medicine

## 2024-10-22 ENCOUNTER — Other Ambulatory Visit (INDEPENDENT_AMBULATORY_CARE_PROVIDER_SITE_OTHER): Payer: Self-pay | Admitting: Pediatrics
